# Patient Record
Sex: Male | Born: 1945
Health system: Southern US, Community
[De-identification: ages and names within clinical notes are randomized; demographics above are authoritative.]

## PROBLEM LIST (undated history)

## (undated) DIAGNOSIS — I10 Essential (primary) hypertension: Secondary | ICD-10-CM

## (undated) DIAGNOSIS — E119 Type 2 diabetes mellitus without complications: Secondary | ICD-10-CM

## (undated) DIAGNOSIS — E785 Hyperlipidemia, unspecified: Secondary | ICD-10-CM

## (undated) DIAGNOSIS — G4730 Sleep apnea, unspecified: Secondary | ICD-10-CM

## (undated) HISTORY — PX: TOE SURGERY: SHX1073

## (undated) HISTORY — DX: Essential (primary) hypertension: I10

## (undated) HISTORY — DX: Type 2 diabetes mellitus without complications: E11.9

## (undated) HISTORY — DX: Hyperlipidemia, unspecified: E78.5

---

## 1983-01-13 HISTORY — PX: OTHER SURGICAL HISTORY: SHX169

## 2003-11-28 ENCOUNTER — Ambulatory Visit: Payer: Self-pay

## 2005-10-13 ENCOUNTER — Emergency Department: Payer: Self-pay | Admitting: Emergency Medicine

## 2005-10-14 ENCOUNTER — Other Ambulatory Visit: Payer: Self-pay

## 2005-10-14 ENCOUNTER — Ambulatory Visit: Payer: Self-pay | Admitting: Emergency Medicine

## 2005-11-09 ENCOUNTER — Ambulatory Visit: Payer: Self-pay | Admitting: Gastroenterology

## 2005-12-22 ENCOUNTER — Ambulatory Visit: Payer: Self-pay | Admitting: Gastroenterology

## 2009-05-07 HISTORY — PX: COLONOSCOPY: SHX174

## 2009-12-08 ENCOUNTER — Emergency Department: Payer: Self-pay | Admitting: Unknown Physician Specialty

## 2009-12-10 ENCOUNTER — Ambulatory Visit: Payer: Self-pay | Admitting: Family Medicine

## 2009-12-13 ENCOUNTER — Ambulatory Visit: Payer: Self-pay | Admitting: Family Medicine

## 2011-02-24 ENCOUNTER — Ambulatory Visit: Payer: Self-pay | Admitting: Gastroenterology

## 2011-06-30 ENCOUNTER — Ambulatory Visit: Payer: Self-pay | Admitting: Gastroenterology

## 2011-09-07 ENCOUNTER — Ambulatory Visit: Payer: Self-pay | Admitting: Pain Medicine

## 2011-09-10 ENCOUNTER — Ambulatory Visit: Payer: Self-pay | Admitting: Pain Medicine

## 2011-09-28 ENCOUNTER — Ambulatory Visit: Payer: Self-pay | Admitting: Pain Medicine

## 2011-10-19 ENCOUNTER — Ambulatory Visit: Payer: Self-pay | Admitting: Pain Medicine

## 2011-11-09 ENCOUNTER — Ambulatory Visit: Payer: Self-pay | Admitting: Pain Medicine

## 2012-07-12 ENCOUNTER — Ambulatory Visit: Payer: Self-pay | Admitting: Orthopedic Surgery

## 2013-08-02 ENCOUNTER — Ambulatory Visit: Payer: Self-pay | Admitting: Anesthesiology

## 2013-08-04 ENCOUNTER — Ambulatory Visit: Payer: Self-pay | Admitting: Unknown Physician Specialty

## 2014-06-27 ENCOUNTER — Other Ambulatory Visit: Payer: Self-pay | Admitting: Family Medicine

## 2014-07-03 ENCOUNTER — Encounter: Payer: Self-pay | Admitting: Family Medicine

## 2014-07-03 ENCOUNTER — Ambulatory Visit (INDEPENDENT_AMBULATORY_CARE_PROVIDER_SITE_OTHER): Payer: BLUE CROSS/BLUE SHIELD | Admitting: Family Medicine

## 2014-07-03 VITALS — BP 126/78 | HR 90 | Temp 97.7°F | Resp 18 | Ht 64.0 in | Wt 163.1 lb

## 2014-07-03 DIAGNOSIS — E119 Type 2 diabetes mellitus without complications: Secondary | ICD-10-CM

## 2014-07-03 DIAGNOSIS — E785 Hyperlipidemia, unspecified: Secondary | ICD-10-CM | POA: Diagnosis not present

## 2014-07-03 DIAGNOSIS — I10 Essential (primary) hypertension: Secondary | ICD-10-CM | POA: Diagnosis not present

## 2014-07-03 DIAGNOSIS — N528 Other male erectile dysfunction: Secondary | ICD-10-CM

## 2014-07-03 LAB — POCT UA - MICROALBUMIN: Microalbumin Ur, POC: 20 mg/L

## 2014-07-03 LAB — GLUCOSE, POCT (MANUAL RESULT ENTRY): POC Glucose: 164 mg/dl — AB (ref 70–99)

## 2014-07-03 LAB — POCT GLYCOSYLATED HEMOGLOBIN (HGB A1C): HEMOGLOBIN A1C: 7.8

## 2014-07-03 NOTE — Progress Notes (Signed)
Name: Aaron Calhoun.   MRN: [id number]    DOB: 08/10/1945   Date:07/03/2014       Progress Note  Subjective  Chief Complaint  Chief Complaint  Patient presents with  . Hypertension  . Diabetes  . Hyperlipidemia    Hyperlipidemia This is a chronic problem. The current episode started more than 1 year ago. The problem is controlled. Recent lipid tests were reviewed and are normal. Exacerbating diseases include diabetes and obesity. Factors aggravating his hyperlipidemia include fatty foods. Pertinent negatives include no chest pain, focal weakness, myalgias or shortness of breath. The current treatment provides moderate improvement of lipids. Risk factors for coronary artery disease include diabetes mellitus, dyslipidemia, hypertension and male sex.  Diabetes He presents for his follow-up diabetic visit. He has type 2 diabetes mellitus. His disease course has been worsening. There are no hypoglycemic associated symptoms. Pertinent negatives for hypoglycemia include no dizziness, headaches, nervousness/anxiousness, seizures or tremors. Pertinent negatives for diabetes include no blurred vision, no chest pain, no weakness and no weight loss. Symptoms are worsening. There are no diabetic complications. He is compliant with treatment all of the time. His weight is increasing steadily. He is following a diabetic diet. His home blood glucose trend is increasing steadily. His breakfast blood glucose range is generally 70-90 mg/dl. His highest blood glucose is 140-180 mg/dl.  Hypertension This is a chronic problem. The current episode started more than 1 year ago. The problem is unchanged. The problem is controlled. Pertinent negatives include no blurred vision, chest pain, headaches, neck pain, orthopnea, palpitations or shortness of breath. There are no associated agents to hypertension. Risk factors for coronary artery disease include diabetes mellitus, dyslipidemia, obesity and male gender. Past  treatments include angiotensin blockers and diuretics. The current treatment provides moderate improvement.      Past Medical History  Diagnosis Date  . Diabetes mellitus without complication   . Hyperlipidemia   . Hypertension     History  Substance Use Topics  . Smoking status: Never Smoker   . Smokeless tobacco: Never Used  . Alcohol Use: No     Current outpatient prescriptions:  .  aspirin EC 81 MG tablet, Take 81 mg by mouth daily., Disp: , Rfl:  .  gabapentin (NEURONTIN) 300 MG capsule, Take 300 mg by mouth 4 (four) times daily., Disp: , Rfl:  .  glimepiride (AMARYL) 2 MG tablet, Take 2 mg by mouth daily with breakfast., Disp: , Rfl:  .  JANUVIA 100 MG tablet, TAKE ONE TABLET BY MOUTH ONCE DAILY, Disp: 30 tablet, Rfl: 0 .  metFORMIN (GLUCOPHAGE) 1000 MG tablet, Take 1,000 mg by mouth 2 (two) times daily with a meal., Disp: , Rfl:  .  rosuvastatin (CRESTOR) 5 MG tablet, Take 5 mg by mouth daily., Disp: , Rfl:  .  tadalafil (CIALIS) 20 MG tablet, Take 20 mg by mouth daily as needed for erectile dysfunction., Disp: , Rfl:  .  valsartan-hydrochlorothiazide (DIOVAN-HCT) 160-25 MG per tablet, Take 1 tablet by mouth daily., Disp: , Rfl:   No Known Allergies  Review of Systems  Constitutional: Negative for fever, chills and weight loss.  HENT: Negative for congestion, hearing loss, sore throat and tinnitus.   Eyes: Negative for blurred vision, double vision and redness.  Respiratory: Negative for cough, hemoptysis and shortness of breath.   Cardiovascular: Negative for chest pain, palpitations, orthopnea, claudication and leg swelling.  Gastrointestinal: Negative for heartburn, nausea, vomiting, diarrhea, constipation and blood in stool.  Genitourinary: Negative for  dysuria, urgency, frequency and hematuria.  Musculoskeletal: Negative for myalgias, back pain, joint pain, falls and neck pain.  Skin: Negative for itching.  Neurological: Negative for dizziness, tingling, tremors,  focal weakness, seizures, loss of consciousness, weakness and headaches.  Endo/Heme/Allergies: Does not bruise/bleed easily.  Psychiatric/Behavioral: Negative for depression and substance abuse. The patient is not nervous/anxious and does not have insomnia.      Objective  Filed Vitals:   07/03/14 1511  BP: 126/78  Pulse: 90  Temp: 97.7 F (36.5 C)  TempSrc: Oral  Resp: 18  Height: 5\' 4"  (1.626 m)  Weight: 163 lb 1.6 oz (73.982 kg)  SpO2: 97%     Physical Exam  Constitutional: He is oriented to person, place, and time and well-developed, well-nourished, and in no distress.  HENT:  Head: Normocephalic.  Eyes: EOM are normal. Pupils are equal, round, and reactive to light.  Neck: Normal range of motion. Neck supple. No thyromegaly present.  Cardiovascular: Normal rate, regular rhythm and normal heart sounds.   No murmur heard. Pulmonary/Chest: Effort normal and breath sounds normal. No respiratory distress. He has no wheezes.  Abdominal: Soft. Bowel sounds are normal.  Musculoskeletal: Normal range of motion. He exhibits no edema.  Lymphadenopathy:    He has no cervical adenopathy.  Neurological: He is alert and oriented to person, place, and time. No cranial nerve deficit. Gait normal. Coordination normal.  Skin: Skin is warm and dry. No rash noted.  Psychiatric: Affect and judgment normal.      Assessment & Plan  1. Type 2 diabetes mellitus without complication J4G has risen - gabapentin (NEURONTIN) 300 MG capsule; Take 300 mg by mouth 4 (four) times daily. - valsartan-hydrochlorothiazide (DIOVAN-HCT) 160-25 MG per tablet; Take 1 tablet by mouth daily. - glimepiride (AMARYL) 2 MG tablet; Take 2 mg by mouth daily with breakfast. - metFORMIN (GLUCOPHAGE) 1000 MG tablet; Take 1,000 mg by mouth once.  - aspirin EC 81 MG tablet; Take 81 mg by mouth daily. - tadalafil (CIALIS) 20 MG tablet; Take 20 mg by mouth daily as needed for erectile dysfunction. - POCT Glucose  (CBG) - POCT HgB A1C - POCT UA - Microalbumin  2. Essential hypertension Well-controlled - valsartan-hydrochlorothiazide (DIOVAN-HCT) 160-25 MG per tablet; Take 1 tablet by mouth daily.  3. Hyperlipemia Well-controlled - rosuvastatin (CRESTOR) 5 MG tablet; Take 5 mg by mouth daily.  4. Other male erectile dysfunction Stable - tadalafil (CIALIS) 20 MG tablet; Take 20 mg by mouth daily as needed for erectile dysfunction.

## 2014-07-03 NOTE — Patient Instructions (Signed)
F/U 3 MO

## 2014-07-04 ENCOUNTER — Encounter: Payer: Self-pay | Admitting: Family Medicine

## 2014-07-26 ENCOUNTER — Ambulatory Visit (INDEPENDENT_AMBULATORY_CARE_PROVIDER_SITE_OTHER): Payer: BLUE CROSS/BLUE SHIELD | Admitting: Family Medicine

## 2014-07-26 ENCOUNTER — Encounter: Payer: Self-pay | Admitting: Family Medicine

## 2014-07-26 VITALS — BP 120/68 | HR 75 | Temp 98.2°F | Resp 18 | Ht 64.0 in | Wt 166.0 lb

## 2014-07-26 DIAGNOSIS — Z0000 Encounter for general adult medical examination without abnormal findings: Secondary | ICD-10-CM

## 2014-07-26 DIAGNOSIS — N138 Other obstructive and reflux uropathy: Secondary | ICD-10-CM

## 2014-07-26 DIAGNOSIS — N401 Enlarged prostate with lower urinary tract symptoms: Secondary | ICD-10-CM

## 2014-07-26 MED ORDER — TAMSULOSIN HCL 0.4 MG PO CAPS
0.4000 mg | ORAL_CAPSULE | Freq: Every day | ORAL | Status: DC
Start: 2014-07-26 — End: 2014-11-05

## 2014-07-26 MED ORDER — TAMSULOSIN HCL 0.4 MG PO CAPS: 0.4000 mg | ORAL_CAPSULE | Freq: Every day | ORAL | Status: DC

## 2014-07-26 NOTE — Progress Notes (Signed)
Name: Aaron Calhoun.   MRN: [id number]    DOB: 1945-05-24   Date:07/26/2014       Progress Note  Subjective  Chief Complaint  Chief Complaint  Patient presents with  . Annual Exam    HPI  69 year old for H&P.   There are no new significant medical development since last physical but does continue to struggle with BPH symptomatology of nocturia and frequency.  Past Medical History  Diagnosis Date  . Diabetes mellitus without complication   . Hyperlipidemia   . Hypertension     History  Substance Use Topics  . Smoking status: Never Smoker   . Smokeless tobacco: Never Used  . Alcohol Use: No     Current outpatient prescriptions:  .  aspirin EC 81 MG tablet, Take 81 mg by mouth daily., Disp: , Rfl:  .  gabapentin (NEURONTIN) 300 MG capsule, Take 300 mg by mouth 4 (four) times daily., Disp: , Rfl:  .  glimepiride (AMARYL) 2 MG tablet, Take 2 mg by mouth daily with breakfast., Disp: , Rfl:  .  JANUVIA 100 MG tablet, TAKE ONE TABLET BY MOUTH ONCE DAILY, Disp: 30 tablet, Rfl: 0 .  metFORMIN (GLUCOPHAGE) 1000 MG tablet, Take 1,000 mg by mouth once. , Disp: , Rfl:  .  rosuvastatin (CRESTOR) 5 MG tablet, Take 5 mg by mouth daily., Disp: , Rfl:  .  tadalafil (CIALIS) 20 MG tablet, Take 20 mg by mouth daily as needed for erectile dysfunction., Disp: , Rfl:  .  valsartan-hydrochlorothiazide (DIOVAN-HCT) 160-25 MG per tablet, Take 1 tablet by mouth daily., Disp: , Rfl:   No Known Allergies  Review of Systems  Constitutional: Negative for fever, chills and weight loss.  HENT: Negative for congestion, hearing loss, sore throat and tinnitus.   Eyes: Negative for blurred vision, double vision and redness.  Respiratory: Negative for cough, hemoptysis and shortness of breath.   Cardiovascular: Negative for chest pain, palpitations, orthopnea, claudication and leg swelling.  Gastrointestinal: Negative for heartburn, nausea, vomiting, diarrhea, constipation and blood in stool.   Genitourinary: Positive for urgency and frequency. Negative for dysuria and hematuria.       Has nocturia multiple times at night. There's been no significant improvement with Flomax  Musculoskeletal: Negative for myalgias, back pain, joint pain, falls and neck pain.  Skin: Negative for itching.  Neurological: Negative for dizziness, tingling, tremors, focal weakness, seizures, loss of consciousness, weakness and headaches.  Endo/Heme/Allergies: Does not bruise/bleed easily.  Psychiatric/Behavioral: Negative for depression and substance abuse. The patient is not nervous/anxious and does not have insomnia.      Objective  Filed Vitals:   07/26/14 1535  BP: 120/68  Pulse: 75  Temp: 98.2 F (36.8 C)  TempSrc: Oral  Resp: 18  Height: 5\' 4"  (1.626 m)  Weight: 166 lb (75.297 kg)  SpO2: 97%     Physical Exam  Constitutional: He is oriented to person, place, and time and well-developed, well-nourished, and in no distress.  HENT:  Head: Normocephalic.  Eyes: EOM are normal. Pupils are equal, round, and reactive to light.  Neck: Normal range of motion. Neck supple. No thyromegaly present.  Cardiovascular: Normal rate, regular rhythm and normal heart sounds.   No murmur heard. Pulmonary/Chest: Effort normal and breath sounds normal. No respiratory distress. He has no wheezes.  Abdominal: Soft. Bowel sounds are normal.  Genitourinary: Rectum normal and penis normal. Guaiac negative stool. No discharge found.  Prostate is modestly enlarged  Musculoskeletal: Normal range of motion.  He exhibits no edema.  Lymphadenopathy:    He has no cervical adenopathy.  Neurological: He is alert and oriented to person, place, and time. No cranial nerve deficit. Gait normal. Coordination normal.  Skin: Skin is warm and dry. No rash noted.  Psychiatric: Affect and judgment normal.      Assessment & Plan  1. Annual physical exam  - CBC with Differential/Platelet - POC Hemoccult Bld/Stl (1-Cd  Office Dx) - PSA - Comprehensive metabolic panel - Lipid panel - POC Hemoccult Bld/Stl (3-Cd Home Screen); Future - TSH  2. BPH with obstruction/lower urinary tract symptoms Follow Flomax and he was referred to urologist as well. Routine PSA is also obtained

## 2014-08-07 ENCOUNTER — Other Ambulatory Visit: Payer: Self-pay | Admitting: Family Medicine

## 2014-08-20 ENCOUNTER — Encounter: Payer: Self-pay | Admitting: Family Medicine

## 2014-09-06 ENCOUNTER — Other Ambulatory Visit: Payer: Self-pay | Admitting: Family Medicine

## 2014-09-07 ENCOUNTER — Other Ambulatory Visit: Payer: Self-pay | Admitting: Family Medicine

## 2014-09-19 ENCOUNTER — Other Ambulatory Visit: Payer: Self-pay | Admitting: Emergency Medicine

## 2014-09-20 ENCOUNTER — Encounter: Payer: Self-pay | Admitting: Family Medicine

## 2014-10-01 ENCOUNTER — Other Ambulatory Visit: Payer: Self-pay | Admitting: Family Medicine

## 2014-11-03 ENCOUNTER — Other Ambulatory Visit: Payer: Self-pay | Admitting: Family Medicine

## 2014-11-05 ENCOUNTER — Encounter: Payer: Self-pay | Admitting: Family Medicine

## 2014-11-05 ENCOUNTER — Ambulatory Visit (INDEPENDENT_AMBULATORY_CARE_PROVIDER_SITE_OTHER): Payer: PPO | Admitting: Family Medicine

## 2014-11-05 VITALS — BP 130/70 | HR 76 | Temp 97.0°F | Resp 18 | Ht 64.0 in | Wt 170.0 lb

## 2014-11-05 DIAGNOSIS — E1169 Type 2 diabetes mellitus with other specified complication: Secondary | ICD-10-CM | POA: Insufficient documentation

## 2014-11-05 DIAGNOSIS — E0840 Diabetes mellitus due to underlying condition with diabetic neuropathy, unspecified: Secondary | ICD-10-CM

## 2014-11-05 DIAGNOSIS — E1140 Type 2 diabetes mellitus with diabetic neuropathy, unspecified: Secondary | ICD-10-CM | POA: Insufficient documentation

## 2014-11-05 DIAGNOSIS — IMO0002 Reserved for concepts with insufficient information to code with codable children: Secondary | ICD-10-CM | POA: Insufficient documentation

## 2014-11-05 DIAGNOSIS — N400 Enlarged prostate without lower urinary tract symptoms: Secondary | ICD-10-CM | POA: Diagnosis not present

## 2014-11-05 DIAGNOSIS — E785 Hyperlipidemia, unspecified: Secondary | ICD-10-CM

## 2014-11-05 DIAGNOSIS — I10 Essential (primary) hypertension: Secondary | ICD-10-CM | POA: Diagnosis not present

## 2014-11-05 DIAGNOSIS — E119 Type 2 diabetes mellitus without complications: Secondary | ICD-10-CM | POA: Insufficient documentation

## 2014-11-05 LAB — GLUCOSE, POCT (MANUAL RESULT ENTRY): POC Glucose: 153 mg/dl — AB (ref 70–99)

## 2014-11-05 LAB — POCT GLYCOSYLATED HEMOGLOBIN (HGB A1C): HEMOGLOBIN A1C: 7.1

## 2014-11-05 MED ORDER — CRESTOR 5 MG PO TABS
5.0000 mg | ORAL_TABLET | Freq: Every day | ORAL | Status: DC
Start: 2014-11-05 — End: 2016-01-21

## 2014-11-05 MED ORDER — METFORMIN HCL 1000 MG PO TABS: 1000.0000 mg | ORAL_TABLET | Freq: Once | ORAL | Status: DC

## 2014-11-05 MED ORDER — SITAGLIPTIN PHOSPHATE 100 MG PO TABS: 100.0000 mg | ORAL_TABLET | Freq: Every day | ORAL | Status: DC

## 2014-11-05 MED ORDER — GLIMEPIRIDE 2 MG PO TABS: 2.0000 mg | ORAL_TABLET | Freq: Every day | ORAL | Status: DC

## 2014-11-05 MED ORDER — VALSARTAN-HYDROCHLOROTHIAZIDE 160-25 MG PO TABS: 1.0000 | ORAL_TABLET | Freq: Every day | ORAL | Status: DC

## 2014-11-05 MED ORDER — CELECOXIB 200 MG PO CAPS: 200.0000 mg | ORAL_CAPSULE | Freq: Two times a day (BID) | ORAL | Status: DC

## 2014-11-05 MED ORDER — TAMSULOSIN HCL 0.4 MG PO CAPS: 0.4000 mg | ORAL_CAPSULE | Freq: Every day | ORAL | Status: DC

## 2014-11-05 NOTE — Progress Notes (Signed)
Name: Aaron Calhoun.   MRN: [id number]    DOB: 1945/05/27   Date:11/05/2014       Progress Note  Subjective  Chief Complaint  Chief Complaint  Patient presents with  . Hypertension    4 month follow up  . Diabetes  . Hyperlipidemia    HPI  Hypertension   Patient presents for follow-up of hypertension. It has been present for over 5 years.  Patient states that there is compliance with medical regimen which consists of  . There is no end organ disease. Cardiac risk factors include hypertension hyperlipidemia and diabetes.  Exercise regimen consist of some walking some walking .  Diet consist of low-sodium . Diovan HCT 160/25  Diabetes  Patient presents for follow-up of diabetes which is present for over 5 years. Is currently on a regimen of Januvia 100 mg daily metformin thousand milligrams daily and glimepiride 2 mg daily   . Patient states irregular compliance with their diet and exercise. There's been no hypoglycemic episodes and there is no polyuria polydipsia polyphagia. His average fasting glucoses been in the low around 89 with a high around 231 . There is no end organ disease.  Last diabetic eye exam was less than one year ago assuming year ago.   Last visit with dietitian was less than one year ago. Last microalbumin was obtained June 2016 20 .   Hyperlipidemia  Patient has a history of hyperlipidemia for over 5 years.  Current medical regimen consist of Crestor 5 mg every at bedtime .  Compliance is good .  Diet and exercise are currently followed usually .  Risk factors for cardiovascular disease include hyperlipidemia diabetes hypertension sedentary lifestyle age over 32 .   There have been no side effects from the medication.      Past Medical History  Diagnosis Date  . Diabetes mellitus without complication ([place])   . Hyperlipidemia   . Hypertension     Social History  Substance Use Topics  . Smoking status: Never Smoker   . Smokeless tobacco: Never Used  .  Alcohol Use: No     Current outpatient prescriptions:  .  aspirin EC 81 MG tablet, Take 81 mg by mouth daily., Disp: , Rfl:  .  celecoxib (CELEBREX) 200 MG capsule, Take 1 capsule (200 mg total) by mouth 2 (two) times daily., Disp: 60 capsule, Rfl: 2 .  CIALIS 20 MG tablet, TAKE ONE TABLET BY MOUTH ONCE DAILY AS NEEDED, Disp: 6 tablet, Rfl: 0 .  CRESTOR 5 MG tablet, Take 1 tablet (5 mg total) by mouth daily., Disp: 30 tablet, Rfl: 5 .  gabapentin (NEURONTIN) 300 MG capsule, Take 300 mg by mouth 4 (four) times daily., Disp: , Rfl:  .  glimepiride (AMARYL) 2 MG tablet, Take 1 tablet (2 mg total) by mouth daily with breakfast., Disp: 60 tablet, Rfl: 5 .  metFORMIN (GLUCOPHAGE) 1000 MG tablet, Take 1 tablet (1,000 mg total) by mouth once., Disp: 30 tablet, Rfl: 5 .  sitaGLIPtin (JANUVIA) 100 MG tablet, Take 1 tablet (100 mg total) by mouth daily., Disp: 30 tablet, Rfl: 5 .  valsartan-hydrochlorothiazide (DIOVAN-HCT) 160-25 MG tablet, Take 1 tablet by mouth daily., Disp: 30 tablet, Rfl: 5 .  tamsulosin (FLOMAX) 0.4 MG CAPS capsule, Take 1 capsule (0.4 mg total) by mouth daily., Disp: 30 capsule, Rfl: 3  No Known Allergies  Review of Systems  Constitutional: Negative for fever, chills and weight loss.  HENT: Negative for congestion, hearing loss, sore throat  and tinnitus.   Eyes: Negative for blurred vision, double vision and redness.  Respiratory: Negative for cough, hemoptysis and shortness of breath.   Cardiovascular: Negative for chest pain, palpitations, orthopnea, claudication and leg swelling.  Gastrointestinal: Negative for heartburn, nausea, vomiting, diarrhea, constipation and blood in stool.  Genitourinary: Positive for urgency (DESPITE USES fLOMAX) and frequency. Negative for dysuria and hematuria.  Musculoskeletal: Negative for myalgias, back pain, joint pain, falls and neck pain.  Skin: Negative for itching.  Neurological: Positive for sensory change. Negative for dizziness,  tingling, tremors, focal weakness, seizures, loss of consciousness, weakness and headaches.  Endo/Heme/Allergies: Does not bruise/bleed easily.  Psychiatric/Behavioral: Negative for depression and substance abuse. The patient is not nervous/anxious and does not have insomnia.      Objective  Filed Vitals:   11/05/14 0952  BP: 130/70  Pulse: 76  Temp: 97 F (36.1 C)  TempSrc: Oral  Resp: 18  Height: 5\' 4"  (1.626 m)  Weight: 170 lb (77.111 kg)  SpO2: 97%     Physical Exam  Constitutional: He is oriented to person, place, and time and well-developed, well-nourished, and in no distress.  HENT:  Head: Normocephalic.  Eyes: EOM are normal. Pupils are equal, round, and reactive to light.  Neck: Normal range of motion. Neck supple. No thyromegaly present.  Cardiovascular: Normal rate, regular rhythm and normal heart sounds.   No murmur heard. Pulmonary/Chest: Effort normal and breath sounds normal. No respiratory distress. He has no wheezes.  Abdominal: Soft. Bowel sounds are normal.  Musculoskeletal: Normal range of motion. He exhibits no edema.  Lymphadenopathy:    He has no cervical adenopathy.  Neurological: He is alert and oriented to person, place, and time. No cranial nerve deficit. Gait normal. Coordination normal.  Skin: Skin is warm and dry. No rash noted.  Psychiatric: Affect and judgment normal.      Assessment & Plan   1. Type 2 diabetes mellitus with other specified complication (HCC)  - POCT HgB A1C - POCT Glucose (CBG) - metFORMIN (GLUCOPHAGE) 1000 MG tablet; Take 1 tablet (1,000 mg total) by mouth once.  Dispense: 30 tablet; Refill: 5 - glimepiride (AMARYL) 2 MG tablet; Take 1 tablet (2 mg total) by mouth daily with breakfast.  Dispense: 60 tablet; Refill: 5 - TSH  2. Diabetes mellitus due to underlying condition with diabetic neuropathy, without long-term current use of insulin (HCC) Continue gabapentIN  3. Essential hypertension Well-controlled -  Comprehensive Metabolic Panel (CMET)  4. Hyperlipidemia Lab - Lipid panel  5. Benign prostate hyperplasia This patient has failed a trial of Flomax will refer him on to urologist - Ambulatory referral to Urology

## 2014-11-06 LAB — COMPREHENSIVE METABOLIC PANEL
ALK PHOS: 69 IU/L (ref 39–117)
ALT: 38 IU/L (ref 0–44)
AST: 21 IU/L (ref 0–40)
Albumin/Globulin Ratio: 1.9 (ref 1.1–2.5)
Albumin: 4.9 g/dL — ABNORMAL HIGH (ref 3.6–4.8)
BILIRUBIN TOTAL: 0.6 mg/dL (ref 0.0–1.2)
BUN/Creatinine Ratio: 16 (ref 10–22)
BUN: 13 mg/dL (ref 8–27)
CHLORIDE: 99 mmol/L (ref 97–106)
CO2: 27 mmol/L (ref 18–29)
CREATININE: 0.81 mg/dL (ref 0.76–1.27)
Calcium: 9.7 mg/dL (ref 8.6–10.2)
GFR calc Af Amer: 105 mL/min/{1.73_m2} (ref >59)
GFR calc non Af Amer: 91 mL/min/{1.73_m2} (ref >59)
GLUCOSE: 158 mg/dL — AB (ref 65–99)
Globulin, Total: 2.6 g/dL (ref 1.5–4.5)
Potassium: 4.5 mmol/L (ref 3.5–5.2)
Sodium: 142 mmol/L (ref 136–144)
Total Protein: 7.5 g/dL (ref 6.0–8.5)

## 2014-11-06 LAB — LIPID PANEL
CHOLESTEROL TOTAL: 137 mg/dL (ref 100–199)
Chol/HDL Ratio: 2 ratio units (ref 0.0–5.0)
HDL: 68 mg/dL (ref >39)
LDL CALC: 49 mg/dL (ref 0–99)
TRIGLYCERIDES: 99 mg/dL (ref 0–149)
VLDL CHOLESTEROL CAL: 20 mg/dL (ref 5–40)

## 2014-11-06 LAB — TSH: TSH: 2.19 u[IU]/mL (ref 0.450–4.500)

## 2014-11-07 ENCOUNTER — Ambulatory Visit (INDEPENDENT_AMBULATORY_CARE_PROVIDER_SITE_OTHER): Payer: PPO | Admitting: Obstetrics and Gynecology

## 2014-11-07 ENCOUNTER — Encounter: Payer: Self-pay | Admitting: Obstetrics and Gynecology

## 2014-11-07 VITALS — Resp 16 | Ht 65.0 in | Wt 172.3 lb

## 2014-11-07 DIAGNOSIS — N529 Male erectile dysfunction, unspecified: Secondary | ICD-10-CM

## 2014-11-07 DIAGNOSIS — N400 Enlarged prostate without lower urinary tract symptoms: Secondary | ICD-10-CM | POA: Diagnosis not present

## 2014-11-07 LAB — BLADDER SCAN AMB NON-IMAGING

## 2014-11-07 MED ORDER — FINASTERIDE 5 MG PO TABS: 5.0000 mg | ORAL_TABLET | Freq: Every day | ORAL | Status: DC

## 2014-11-07 MED ORDER — SILDENAFIL CITRATE 20 MG PO TABS: ORAL_TABLET | ORAL | Status: DC
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
## 2014-11-08 LAB — PSA: PROSTATE SPECIFIC AG, SERUM: 1 ng/mL (ref 0.0–4.0)

## 2014-11-09 ENCOUNTER — Telehealth: Payer: Self-pay

## 2014-11-09 NOTE — Telephone Encounter (Signed)
-----   Message from Roda Shutters, [place] sent at 11/09/2014  3:45 PM EDT ----- Please notify patient that his PSA was good at 1.0. Thanks

## 2014-11-09 NOTE — Telephone Encounter (Signed)
Spoke with pt in reference to PSA results. Pt voiced understanding.  

## 2014-11-15 ENCOUNTER — Other Ambulatory Visit: Payer: Self-pay | Admitting: Family Medicine

## 2014-11-30 ENCOUNTER — Telehealth: Payer: Self-pay

## 2014-11-30 ENCOUNTER — Telehealth: Payer: Self-pay | Admitting: Urology

## 2014-11-30 NOTE — Telephone Encounter (Signed)
Pt came by stating his sildenafil requires a PA. Nurse attempted to do PA and insurance company states this medication is on their formulary and does not require a PA. Pt is allowed to have 90a month.

## 2014-11-30 NOTE — Telephone Encounter (Signed)
Patient wants to know about a prior autho request that he dropped off a few days ago?   Can you call him back please,  thanks

## 2014-11-30 NOTE — Telephone Encounter (Signed)
Per [place] the patient can have 90 a month the medication is already on his formulary and it does not require a prior autho. It shouls already be at his pharmacy and he just needs to go pick it up. I spoke with thepatient's wife and explained that to her today at 4:36.    Sharyn Lull

## 2014-12-04 ENCOUNTER — Other Ambulatory Visit: Payer: Self-pay

## 2014-12-04 DIAGNOSIS — N529 Male erectile dysfunction, unspecified: Secondary | ICD-10-CM

## 2014-12-04 MED ORDER — SILDENAFIL CITRATE 20 MG PO TABS: ORAL_TABLET | ORAL | Status: DC

## 2014-12-21 ENCOUNTER — Other Ambulatory Visit: Payer: Self-pay | Admitting: Obstetrics and Gynecology

## 2014-12-26 ENCOUNTER — Other Ambulatory Visit: Payer: Self-pay

## 2014-12-26 DIAGNOSIS — N529 Male erectile dysfunction, unspecified: Secondary | ICD-10-CM

## 2014-12-26 MED ORDER — SILDENAFIL CITRATE 20 MG PO TABS: ORAL_TABLET | ORAL | Status: DC

## 2015-01-24 ENCOUNTER — Other Ambulatory Visit: Payer: Self-pay

## 2015-01-24 DIAGNOSIS — N529 Male erectile dysfunction, unspecified: Secondary | ICD-10-CM

## 2015-01-24 MED ORDER — SILDENAFIL CITRATE 100 MG PO TABS: 100.0000 mg | ORAL_TABLET | Freq: Every day | ORAL | Status: DC | PRN

## 2015-01-25 ENCOUNTER — Telehealth: Payer: Self-pay | Admitting: Obstetrics and Gynecology

## 2015-01-25 ENCOUNTER — Other Ambulatory Visit: Payer: Self-pay

## 2015-01-25 DIAGNOSIS — N529 Male erectile dysfunction, unspecified: Secondary | ICD-10-CM

## 2015-01-25 MED ORDER — SILDENAFIL CITRATE 20 MG PO TABS: ORAL_TABLET | ORAL | Status: DC

## 2015-01-25 NOTE — Telephone Encounter (Signed)
Pt came to office and would like his rx for Sildenafil sent to [facility] on [address].  He has new insurance.  I left copy on your desk.

## 2015-01-25 NOTE — Telephone Encounter (Signed)
Resent medication to pharmacy

## 2015-01-28 NOTE — Telephone Encounter (Signed)
Encounter made in error. 

## 2015-01-30 ENCOUNTER — Other Ambulatory Visit: Payer: Self-pay | Admitting: Family Medicine

## 2015-01-30 DIAGNOSIS — E1169 Type 2 diabetes mellitus with other specified complication: Secondary | ICD-10-CM

## 2015-01-30 MED ORDER — TAMSULOSIN HCL 0.4 MG PO CAPS: 0.4000 mg | ORAL_CAPSULE | Freq: Every day | ORAL | Status: DC

## 2015-01-30 MED ORDER — METFORMIN HCL 1000 MG PO TABS: 1000.0000 mg | ORAL_TABLET | Freq: Once | ORAL | Status: DC

## 2015-01-30 MED ORDER — GABAPENTIN 300 MG PO CAPS
300.0000 mg | ORAL_CAPSULE | Freq: Four times a day (QID) | ORAL | Status: DC
Start: 2015-01-30 — End: 2015-02-18

## 2015-01-30 MED ORDER — GLIMEPIRIDE 2 MG PO TABS: 2.0000 mg | ORAL_TABLET | Freq: Every day | ORAL | Status: DC

## 2015-01-30 MED ORDER — FINASTERIDE 5 MG PO TABS: 5.0000 mg | ORAL_TABLET | Freq: Every day | ORAL | Status: DC

## 2015-01-30 MED ORDER — VALSARTAN-HYDROCHLOROTHIAZIDE 160-25 MG PO TABS: 1.0000 | ORAL_TABLET | Freq: Every day | ORAL | Status: DC

## 2015-01-30 NOTE — Telephone Encounter (Signed)
Patient came into office and stated that his Insurance has changed and need refill on medication at [facility]. Would run out of medication before appointment in February. 3 month supply sent to [facility].

## 2015-02-06 ENCOUNTER — Other Ambulatory Visit: Payer: Self-pay | Admitting: Obstetrics and Gynecology

## 2015-02-06 ENCOUNTER — Encounter: Payer: Self-pay | Admitting: Family Medicine

## 2015-02-06 ENCOUNTER — Ambulatory Visit (INDEPENDENT_AMBULATORY_CARE_PROVIDER_SITE_OTHER): Payer: Medicare Other | Admitting: Family Medicine

## 2015-02-06 VITALS — BP 142/84 | HR 93 | Temp 98.3°F | Resp 16 | Ht 65.0 in | Wt 168.0 lb

## 2015-02-06 DIAGNOSIS — N400 Enlarged prostate without lower urinary tract symptoms: Secondary | ICD-10-CM

## 2015-02-06 DIAGNOSIS — I10 Essential (primary) hypertension: Secondary | ICD-10-CM | POA: Diagnosis not present

## 2015-02-06 DIAGNOSIS — M5416 Radiculopathy, lumbar region: Secondary | ICD-10-CM | POA: Diagnosis not present

## 2015-02-06 MED ORDER — TRAMADOL HCL 50 MG PO TABS: 50.0000 mg | ORAL_TABLET | Freq: Four times a day (QID) | ORAL | Status: DC | PRN

## 2015-02-06 MED ORDER — CYCLOBENZAPRINE HCL 5 MG PO TABS: 5.0000 mg | ORAL_TABLET | Freq: Three times a day (TID) | ORAL | Status: DC | PRN
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
## 2015-02-06 NOTE — Patient Instructions (Signed)
1) Buy heating pad OTC at your pharmacy to plug in and use on her back to loosen up the tight muscles  2) Stretching exercises for your back at least morning and evening while on heating pad for 10 mins  Back Exercises The following exercises strengthen the muscles that help to support the back. They also help to keep the lower back flexible. Doing these exercises can help to prevent back pain or lessen existing pain. If you have back pain or discomfort, try doing these exercises 2-3 times each day or as told by your health care provider. When the pain goes away, do them once each day, but increase the number of times that you repeat the steps for each exercise (do more repetitions). If you do not have back pain or discomfort, do these exercises once each day or as told by your health care provider. EXERCISES Single Knee to Chest Repeat these steps 3-5 times for each leg: 1. Lie on your back on a firm bed or the floor with your legs extended. 2. Bring one knee to your chest. Your other leg should stay extended and in contact with the floor. 3. Hold your knee in place by grabbing your knee or thigh. 4. Pull on your knee until you feel a gentle stretch in your lower back. 5. Hold the stretch for 10-30 seconds. 6. Slowly release and straighten your leg. Pelvic Tilt Repeat these steps 5-10 times: 1. Lie on your back on a firm bed or the floor with your legs extended. 2. Bend your knees so they are pointing toward the ceiling and your feet are flat on the floor. 3. Tighten your lower abdominal muscles to press your lower back against the floor. This motion will tilt your pelvis so your tailbone points up toward the ceiling instead of pointing to your feet or the floor. 4. With gentle tension and even breathing, hold this position for 5-10 seconds. Cat-Cow Repeat these steps until your lower back becomes more flexible: 1. Get into a hands-and-knees position on a firm surface. Keep your hands under  your shoulders, and keep your knees under your hips. You may place padding under your knees for comfort. 2. Let your head hang down, and point your tailbone toward the floor so your lower back becomes rounded like the back of a cat. 3. Hold this position for 5 seconds. 4. Slowly lift your head and point your tailbone up toward the ceiling so your back forms a sagging arch like the back of a cow. 5. Hold this position for 5 seconds. Press-Ups Repeat these steps 5-10 times: 1. Lie on your abdomen (face-down) on the floor. 2. Place your palms near your head, about shoulder-width apart. 3. While you keep your back as relaxed as possible and keep your hips on the floor, slowly straighten your arms to raise the top half of your body and lift your shoulders. Do not use your back muscles to raise your upper torso. You may adjust the placement of your hands to make yourself more comfortable. 4. Hold this position for 5 seconds while you keep your back relaxed. 5. Slowly return to lying flat on the floor. Bridges Repeat these steps 10 times: 1. Lie on your back on a firm surface. 2. Bend your knees so they are pointing toward the ceiling and your feet are flat on the floor. 3. Tighten your buttocks muscles and lift your buttocks off of the floor until your waist is at almost the same  height as your knees. You should feel the muscles working in your buttocks and the back of your thighs. If you do not feel these muscles, slide your feet 1-2 inches farther away from your buttocks. 4. Hold this position for 3-5 seconds. 5. Slowly lower your hips to the starting position, and allow your buttocks muscles to relax completely. If this exercise is too easy, try doing it with your arms crossed over your chest. Abdominal Crunches Repeat these steps 5-10 times: 1. Lie on your back on a firm bed or the floor with your legs extended. 2. Bend your knees so they are pointing toward the ceiling and your feet are flat on  the floor. 3. Cross your arms over your chest. 4. Tip your chin slightly toward your chest without bending your neck. 5. Tighten your abdominal muscles and slowly raise your trunk (torso) high enough to lift your shoulder blades a tiny bit off of the floor. Avoid raising your torso higher than that, because it can put too much stress on your low back and it does not help to strengthen your abdominal muscles. 6. Slowly return to your starting position. Back Lifts Repeat these steps 5-10 times: 1. Lie on your abdomen (face-down) with your arms at your sides, and rest your forehead on the floor. 2. Tighten the muscles in your legs and your buttocks. 3. Slowly lift your chest off of the floor while you keep your hips pressed to the floor. Keep the back of your head in line with the curve in your back. Your eyes should be looking at the floor. 4. Hold this position for 3-5 seconds. 5. Slowly return to your starting position. SEEK MEDICAL CARE IF:  Your back pain or discomfort gets much worse when you do an exercise.  Your back pain or discomfort does not lessen within 2 hours after you exercise. If you have any of these problems, stop doing these exercises right away. Do not do them again unless your health care provider says that you can. SEEK IMMEDIATE MEDICAL CARE IF:  You develop sudden, severe back pain. If this happens, stop doing the exercises right away. Do not do them again unless your health care provider says that you can.   This information is not intended to replace advice given to you by your health care provider. Make sure you discuss any questions you have with your health care provider.   Document Released: 02/06/2004 Document Revised: 09/19/2014 Document Reviewed: 02/22/2014 Elsevier Interactive Patient Education [employer].

## 2015-02-07 ENCOUNTER — Encounter: Payer: Self-pay | Admitting: Obstetrics and Gynecology

## 2015-02-07 ENCOUNTER — Ambulatory Visit (INDEPENDENT_AMBULATORY_CARE_PROVIDER_SITE_OTHER): Payer: Medicare Other | Admitting: Obstetrics and Gynecology

## 2015-02-07 VITALS — BP 156/83 | HR 98 | Resp 16 | Ht 65.0 in | Wt 172.1 lb

## 2015-02-07 DIAGNOSIS — N400 Enlarged prostate without lower urinary tract symptoms: Secondary | ICD-10-CM | POA: Diagnosis not present

## 2015-02-07 DIAGNOSIS — N529 Male erectile dysfunction, unspecified: Secondary | ICD-10-CM

## 2015-02-07 LAB — BLADDER SCAN AMB NON-IMAGING

## 2015-02-07 MED ORDER — SILDENAFIL CITRATE 100 MG PO TABS: ORAL_TABLET | ORAL | Status: DC
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
## 2015-02-08 ENCOUNTER — Telehealth: Payer: Self-pay

## 2015-02-08 ENCOUNTER — Other Ambulatory Visit: Payer: Self-pay

## 2015-02-08 DIAGNOSIS — E1169 Type 2 diabetes mellitus with other specified complication: Secondary | ICD-10-CM

## 2015-02-08 LAB — PSA: Prostate Specific Ag, Serum: 0.6 ng/mL (ref 0.0–4.0)

## 2015-02-08 MED ORDER — METFORMIN HCL 1000 MG PO TABS
1000.0000 mg | ORAL_TABLET | Freq: Every day | ORAL | Status: DC
Start: 2015-02-08 — End: 2015-02-11

## 2015-02-08 NOTE — Telephone Encounter (Signed)
-----   Message from Roda Shutters, [place] sent at 02/08/2015 10:59 AM EST ----- Please notify patient that his PSA was great at 0.6. He needs to keep his follow-up appointment as scheduled. Thanks

## 2015-02-08 NOTE — Telephone Encounter (Signed)
Spoke with pt wife in reference to PSA results. Wife voiced understanding.  

## 2015-02-11 ENCOUNTER — Other Ambulatory Visit: Payer: Self-pay | Admitting: Family Medicine

## 2015-02-11 DIAGNOSIS — E1169 Type 2 diabetes mellitus with other specified complication: Secondary | ICD-10-CM

## 2015-02-11 MED ORDER — METFORMIN HCL 1000 MG PO TABS: 1000.0000 mg | ORAL_TABLET | Freq: Two times a day (BID) | ORAL | Status: DC

## 2015-02-18 ENCOUNTER — Other Ambulatory Visit: Payer: Self-pay | Admitting: Family Medicine

## 2015-03-06 ENCOUNTER — Ambulatory Visit
Admission: RE | Admit: 2015-03-06 | Discharge: 2015-03-06 | Disposition: A | Discharge status: Home / Self Care | Payer: Medicare Other | Source: Ambulatory Visit | Attending: Family Medicine | Admitting: Family Medicine

## 2015-03-06 ENCOUNTER — Encounter: Payer: Self-pay | Admitting: Family Medicine

## 2015-03-06 ENCOUNTER — Ambulatory Visit (INDEPENDENT_AMBULATORY_CARE_PROVIDER_SITE_OTHER): Payer: Medicare Other | Admitting: Family Medicine

## 2015-03-06 VITALS — BP 153/81 | HR 136 | Temp 97.8°F | Resp 22 | Ht 65.0 in | Wt 167.5 lb

## 2015-03-06 DIAGNOSIS — M5136 Other intervertebral disc degeneration, lumbar region: Secondary | ICD-10-CM | POA: Insufficient documentation

## 2015-03-06 DIAGNOSIS — M5416 Radiculopathy, lumbar region: Secondary | ICD-10-CM

## 2015-03-06 MED ORDER — TIZANIDINE HCL 2 MG PO CAPS: 2.0000 mg | ORAL_CAPSULE | Freq: Three times a day (TID) | ORAL | Status: DC | PRN
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
## 2015-03-11 ENCOUNTER — Ambulatory Visit: Payer: PPO | Admitting: Family Medicine

## 2015-03-18 ENCOUNTER — Ambulatory Visit: Payer: Medicare Other | Admitting: Family Medicine

## 2015-03-22 ENCOUNTER — Encounter: Payer: Self-pay | Admitting: Family Medicine

## 2015-03-22 ENCOUNTER — Ambulatory Visit (INDEPENDENT_AMBULATORY_CARE_PROVIDER_SITE_OTHER): Payer: Medicare Other | Admitting: Family Medicine

## 2015-03-22 VITALS — BP 124/72 | HR 104 | Temp 98.2°F | Resp 18 | Ht 65.0 in | Wt 167.3 lb

## 2015-03-22 DIAGNOSIS — M5417 Radiculopathy, lumbosacral region: Secondary | ICD-10-CM

## 2015-03-22 DIAGNOSIS — M5416 Radiculopathy, lumbar region: Secondary | ICD-10-CM
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
## 2015-03-28 ENCOUNTER — Other Ambulatory Visit: Payer: Self-pay

## 2015-03-28 DIAGNOSIS — M5416 Radiculopathy, lumbar region: Secondary | ICD-10-CM

## 2015-03-28 MED ORDER — TRAMADOL HCL 50 MG PO TABS: 50.0000 mg | ORAL_TABLET | Freq: Four times a day (QID) | ORAL | Status: DC | PRN

## 2015-04-12 ENCOUNTER — Ambulatory Visit
Admission: RE | Admit: 2015-04-12 | Discharge: 2015-04-12 | Disposition: A | Discharge status: Home / Self Care | Payer: Medicare Other | Source: Ambulatory Visit | Attending: Family Medicine | Admitting: Family Medicine

## 2015-04-12 DIAGNOSIS — M5126 Other intervertebral disc displacement, lumbar region: Secondary | ICD-10-CM | POA: Insufficient documentation

## 2015-04-12 DIAGNOSIS — M4806 Spinal stenosis, lumbar region: Secondary | ICD-10-CM | POA: Insufficient documentation

## 2015-04-12 DIAGNOSIS — M5417 Radiculopathy, lumbosacral region: Secondary | ICD-10-CM | POA: Diagnosis not present

## 2015-04-12 DIAGNOSIS — M5416 Radiculopathy, lumbar region: Secondary | ICD-10-CM

## 2015-04-15 ENCOUNTER — Other Ambulatory Visit: Payer: Self-pay

## 2015-04-15 DIAGNOSIS — E1169 Type 2 diabetes mellitus with other specified complication: Secondary | ICD-10-CM

## 2015-04-15 MED ORDER — GLIMEPIRIDE 2 MG PO TABS: ORAL_TABLET | ORAL | Status: DC

## 2015-04-15 MED ORDER — METFORMIN HCL 1000 MG PO TABS: 1000.0000 mg | ORAL_TABLET | Freq: Two times a day (BID) | ORAL | Status: DC

## 2015-04-15 MED ORDER — FINASTERIDE 5 MG PO TABS: ORAL_TABLET | ORAL | Status: DC

## 2015-04-15 MED ORDER — GABAPENTIN 300 MG PO CAPS: ORAL_CAPSULE | ORAL | Status: DC

## 2015-04-15 MED ORDER — VALSARTAN-HYDROCHLOROTHIAZIDE 160-25 MG PO TABS: 1.0000 | ORAL_TABLET | Freq: Every day | ORAL | Status: DC

## 2015-04-16 ENCOUNTER — Encounter: Payer: Self-pay | Admitting: Family Medicine

## 2015-04-16 ENCOUNTER — Ambulatory Visit (INDEPENDENT_AMBULATORY_CARE_PROVIDER_SITE_OTHER): Payer: Medicare Other | Admitting: Family Medicine

## 2015-04-16 VITALS — BP 126/73 | HR 86 | Temp 98.6°F | Resp 16 | Ht 65.0 in | Wt 170.2 lb

## 2015-04-16 DIAGNOSIS — M5417 Radiculopathy, lumbosacral region: Secondary | ICD-10-CM | POA: Diagnosis not present

## 2015-04-16 DIAGNOSIS — M5416 Radiculopathy, lumbar region: Secondary | ICD-10-CM

## 2015-04-16 DIAGNOSIS — E0840 Diabetes mellitus due to underlying condition with diabetic neuropathy, unspecified: Secondary | ICD-10-CM | POA: Diagnosis not present

## 2015-04-16 LAB — GLUCOSE, POCT (MANUAL RESULT ENTRY): POC GLUCOSE: 190 mg/dL — AB (ref 70–99)

## 2015-04-16 LAB — POCT GLYCOSYLATED HEMOGLOBIN (HGB A1C): HEMOGLOBIN A1C: 10.5

## 2015-04-16 MED ORDER — HYDROCODONE-ACETAMINOPHEN 5-325 MG PO TABS: 1.0000 | ORAL_TABLET | Freq: Three times a day (TID) | ORAL | Status: DC | PRN
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
## 2015-04-18 ENCOUNTER — Ambulatory Visit: Payer: Medicare Other | Admitting: Family Medicine

## 2015-04-22 ENCOUNTER — Other Ambulatory Visit: Payer: Self-pay | Admitting: Family Medicine

## 2015-04-22 DIAGNOSIS — M48061 Spinal stenosis, lumbar region without neurogenic claudication: Secondary | ICD-10-CM | POA: Insufficient documentation

## 2015-04-23 ENCOUNTER — Encounter: Payer: Self-pay | Admitting: Family Medicine

## 2015-04-23 ENCOUNTER — Ambulatory Visit (INDEPENDENT_AMBULATORY_CARE_PROVIDER_SITE_OTHER): Payer: Medicare Other | Admitting: Family Medicine

## 2015-04-23 VITALS — BP 138/76 | HR 87 | Temp 98.0°F | Resp 16 | Wt 168.7 lb

## 2015-04-23 DIAGNOSIS — IMO0001 Reserved for inherently not codable concepts without codable children: Secondary | ICD-10-CM

## 2015-04-23 DIAGNOSIS — E1165 Type 2 diabetes mellitus with hyperglycemia: Secondary | ICD-10-CM | POA: Diagnosis not present

## 2015-04-23 MED ORDER — EMPAGLIFLOZIN 10 MG PO TABS: 10.0000 mg | ORAL_TABLET | Freq: Every day | ORAL | Status: DC
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
## 2015-04-30 DIAGNOSIS — M5126 Other intervertebral disc displacement, lumbar region: Secondary | ICD-10-CM | POA: Diagnosis not present

## 2015-05-01 ENCOUNTER — Other Ambulatory Visit: Payer: Self-pay | Admitting: Neurosurgery

## 2015-05-01 DIAGNOSIS — M5126 Other intervertebral disc displacement, lumbar region: Secondary | ICD-10-CM

## 2015-05-06 ENCOUNTER — Ambulatory Visit
Admission: RE | Admit: 2015-05-06 | Discharge: 2015-05-06 | Disposition: A | Discharge status: Home / Self Care | Payer: Medicare Other | Source: Ambulatory Visit | Attending: Neurosurgery | Admitting: Neurosurgery

## 2015-05-06 DIAGNOSIS — M5126 Other intervertebral disc displacement, lumbar region: Secondary | ICD-10-CM

## 2015-05-06 DIAGNOSIS — M545 Low back pain: Secondary | ICD-10-CM | POA: Diagnosis not present

## 2015-05-06 MED ORDER — IOHEXOL 180 MG/ML  SOLN
1.0000 mL | Freq: Once | INTRAMUSCULAR | Status: AC | PRN
  Administered 2015-05-06: 1 mL via EPIDURAL

## 2015-05-06 MED ORDER — METHYLPREDNISOLONE ACETATE 40 MG/ML INJ SUSP (RADIOLOG
120.0000 mg | Freq: Once | INTRAMUSCULAR | Status: AC
  Administered 2015-05-06: 120 mg via EPIDURAL

## 2015-05-06 NOTE — Discharge Instructions (Signed)

## 2015-05-08 ENCOUNTER — Ambulatory Visit: Payer: Medicare Other | Admitting: Urology

## 2015-05-08 ENCOUNTER — Ambulatory Visit: Payer: Medicare Other | Admitting: Obstetrics and Gynecology

## 2015-05-27 ENCOUNTER — Ambulatory Visit: Payer: Medicare Other | Admitting: Urology

## 2015-06-03 ENCOUNTER — Other Ambulatory Visit: Payer: Self-pay | Admitting: Family Medicine

## 2015-07-19 ENCOUNTER — Encounter: Payer: Self-pay | Admitting: Family Medicine

## 2015-07-19 ENCOUNTER — Other Ambulatory Visit: Payer: Self-pay | Admitting: Family Medicine

## 2015-07-19 ENCOUNTER — Ambulatory Visit (INDEPENDENT_AMBULATORY_CARE_PROVIDER_SITE_OTHER): Payer: Medicare Other | Admitting: Family Medicine

## 2015-07-19 VITALS — BP 136/74 | HR 85 | Temp 98.0°F | Resp 15 | Ht 65.0 in | Wt 164.0 lb

## 2015-07-19 DIAGNOSIS — E785 Hyperlipidemia, unspecified: Secondary | ICD-10-CM | POA: Diagnosis not present

## 2015-07-19 DIAGNOSIS — R351 Nocturia: Secondary | ICD-10-CM

## 2015-07-19 DIAGNOSIS — N401 Enlarged prostate with lower urinary tract symptoms: Secondary | ICD-10-CM

## 2015-07-19 DIAGNOSIS — I10 Essential (primary) hypertension: Secondary | ICD-10-CM | POA: Diagnosis not present

## 2015-07-19 DIAGNOSIS — E1165 Type 2 diabetes mellitus with hyperglycemia: Secondary | ICD-10-CM | POA: Diagnosis not present

## 2015-07-19 DIAGNOSIS — IMO0001 Reserved for inherently not codable concepts without codable children: Secondary | ICD-10-CM

## 2015-07-19 LAB — POCT GLYCOSYLATED HEMOGLOBIN (HGB A1C): HEMOGLOBIN A1C: 7.2

## 2015-07-19 LAB — POCT UA - MICROALBUMIN
Albumin/Creatinine Ratio, Urine, POC: NEGATIVE
Creatinine, POC: NEGATIVE mg/dL
MICROALBUMIN (UR) POC: NEGATIVE mg/L

## 2015-07-19 LAB — GLUCOSE, POCT (MANUAL RESULT ENTRY): POC Glucose: 172 mg/dl — AB (ref 70–99)

## 2015-07-19 MED ORDER — FINASTERIDE 5 MG PO TABS: ORAL_TABLET | ORAL | Status: DC

## 2015-07-19 MED ORDER — METFORMIN HCL 1000 MG PO TABS: 1000.0000 mg | ORAL_TABLET | Freq: Two times a day (BID) | ORAL | Status: DC

## 2015-07-19 MED ORDER — GLIMEPIRIDE 2 MG PO TABS: ORAL_TABLET | ORAL | Status: DC

## 2015-07-19 MED ORDER — TAMSULOSIN HCL 0.4 MG PO CAPS: 0.4000 mg | ORAL_CAPSULE | Freq: Every day | ORAL | Status: DC

## 2015-07-19 MED ORDER — GABAPENTIN 300 MG PO CAPS: 300.0000 mg | ORAL_CAPSULE | Freq: Four times a day (QID) | ORAL | Status: DC

## 2015-07-19 MED ORDER — VALSARTAN-HYDROCHLOROTHIAZIDE 160-25 MG PO TABS: 1.0000 | ORAL_TABLET | Freq: Every day | ORAL | Status: DC
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
## 2015-07-20 LAB — COMPREHENSIVE METABOLIC PANEL
A/G RATIO: 1.8 (ref 1.2–2.2)
ALT: 33 IU/L (ref 0–44)
AST: 22 IU/L (ref 0–40)
Albumin: 4.9 g/dL — ABNORMAL HIGH (ref 3.5–4.8)
Alkaline Phosphatase: 63 IU/L (ref 39–117)
BUN/Creatinine Ratio: 17 (ref 10–24)
BUN: 14 mg/dL (ref 8–27)
Bilirubin Total: 0.5 mg/dL (ref 0.0–1.2)
CALCIUM: 9.6 mg/dL (ref 8.6–10.2)
CO2: 25 mmol/L (ref 18–29)
Chloride: 97 mmol/L (ref 96–106)
Creatinine, Ser: 0.83 mg/dL (ref 0.76–1.27)
GFR calc Af Amer: 103 mL/min/{1.73_m2} (ref >59)
GFR, EST NON AFRICAN AMERICAN: 89 mL/min/{1.73_m2} (ref >59)
GLOBULIN, TOTAL: 2.7 g/dL (ref 1.5–4.5)
Glucose: 163 mg/dL — ABNORMAL HIGH (ref 65–99)
POTASSIUM: 4.5 mmol/L (ref 3.5–5.2)
SODIUM: 138 mmol/L (ref 134–144)
Total Protein: 7.6 g/dL (ref 6.0–8.5)

## 2015-07-20 LAB — LIPID PANEL WITH LDL/HDL RATIO
Cholesterol, Total: 140 mg/dL (ref 100–199)
HDL: 70 mg/dL (ref >39)
LDL CALC: 48 mg/dL (ref 0–99)
LDl/HDL Ratio: 0.7 ratio units (ref 0.0–3.6)
TRIGLYCERIDES: 110 mg/dL (ref 0–149)
VLDL Cholesterol Cal: 22 mg/dL (ref 5–40)

## 2015-09-24 ENCOUNTER — Ambulatory Visit (INDEPENDENT_AMBULATORY_CARE_PROVIDER_SITE_OTHER): Payer: Medicare Other | Admitting: Family Medicine

## 2015-09-24 ENCOUNTER — Encounter: Payer: Self-pay | Admitting: Family Medicine

## 2015-09-24 DIAGNOSIS — J209 Acute bronchitis, unspecified: Secondary | ICD-10-CM | POA: Diagnosis not present

## 2015-09-24 MED ORDER — AZITHROMYCIN 250 MG PO TABS: ORAL_TABLET | ORAL | 0 refills | Status: DC

## 2015-09-24 MED ORDER — BENZONATATE 200 MG PO CAPS
200.0000 mg | ORAL_CAPSULE | Freq: Three times a day (TID) | ORAL | 0 refills | Status: DC | PRN
Start: 2015-09-24 — End: 2015-10-21
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
## 2015-10-21 ENCOUNTER — Encounter: Payer: Self-pay | Admitting: Family Medicine

## 2015-10-21 ENCOUNTER — Ambulatory Visit (INDEPENDENT_AMBULATORY_CARE_PROVIDER_SITE_OTHER): Payer: Medicare Other | Admitting: Family Medicine

## 2015-10-21 VITALS — BP 116/68 | HR 95 | Temp 99.2°F | Resp 16 | Ht 65.0 in | Wt 164.8 lb

## 2015-10-21 DIAGNOSIS — R351 Nocturia: Secondary | ICD-10-CM | POA: Diagnosis not present

## 2015-10-21 DIAGNOSIS — N401 Enlarged prostate with lower urinary tract symptoms: Secondary | ICD-10-CM | POA: Diagnosis not present

## 2015-10-21 DIAGNOSIS — I10 Essential (primary) hypertension: Secondary | ICD-10-CM

## 2015-10-21 DIAGNOSIS — E119 Type 2 diabetes mellitus without complications: Secondary | ICD-10-CM | POA: Diagnosis not present

## 2015-10-21 LAB — GLUCOSE, POCT (MANUAL RESULT ENTRY): POC Glucose: 149 mg/dl — AB (ref 70–99)

## 2015-10-21 LAB — POCT GLYCOSYLATED HEMOGLOBIN (HGB A1C): Hemoglobin A1C: 7.1

## 2015-10-21 MED ORDER — FINASTERIDE 5 MG PO TABS: ORAL_TABLET | ORAL | 1 refills | Status: DC

## 2015-10-21 MED ORDER — TAMSULOSIN HCL 0.4 MG PO CAPS: 0.4000 mg | ORAL_CAPSULE | Freq: Every day | ORAL | 1 refills | Status: DC

## 2015-10-21 MED ORDER — VALSARTAN-HYDROCHLOROTHIAZIDE 160-25 MG PO TABS: 1.0000 | ORAL_TABLET | Freq: Every day | ORAL | 1 refills | Status: DC

## 2015-10-21 MED ORDER — METFORMIN HCL 1000 MG PO TABS
1000.0000 mg | ORAL_TABLET | Freq: Two times a day (BID) | ORAL | 1 refills | Status: DC
Start: 2015-10-21 — End: 2016-01-21

## 2015-10-21 MED ORDER — GLIMEPIRIDE 2 MG PO TABS: ORAL_TABLET | ORAL | 1 refills | Status: DC

## 2015-10-21 MED ORDER — GABAPENTIN 300 MG PO CAPS: 300.0000 mg | ORAL_CAPSULE | Freq: Four times a day (QID) | ORAL | 1 refills | Status: DC
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
## 2016-01-21 ENCOUNTER — Ambulatory Visit (INDEPENDENT_AMBULATORY_CARE_PROVIDER_SITE_OTHER): Payer: Medicare Other | Admitting: Family Medicine

## 2016-01-21 ENCOUNTER — Encounter: Payer: Self-pay | Admitting: Family Medicine

## 2016-01-21 VITALS — BP 115/61 | HR 86 | Temp 99.0°F | Resp 15 | Ht 65.0 in | Wt 165.9 lb

## 2016-01-21 DIAGNOSIS — N401 Enlarged prostate with lower urinary tract symptoms: Secondary | ICD-10-CM | POA: Diagnosis not present

## 2016-01-21 DIAGNOSIS — I10 Essential (primary) hypertension: Secondary | ICD-10-CM

## 2016-01-21 DIAGNOSIS — E785 Hyperlipidemia, unspecified: Secondary | ICD-10-CM

## 2016-01-21 DIAGNOSIS — R351 Nocturia: Secondary | ICD-10-CM

## 2016-01-21 DIAGNOSIS — G8928 Other chronic postprocedural pain: Secondary | ICD-10-CM | POA: Insufficient documentation

## 2016-01-21 DIAGNOSIS — E1165 Type 2 diabetes mellitus with hyperglycemia: Secondary | ICD-10-CM | POA: Diagnosis not present

## 2016-01-21 DIAGNOSIS — IMO0001 Reserved for inherently not codable concepts without codable children: Secondary | ICD-10-CM

## 2016-01-21 LAB — GLUCOSE, POCT (MANUAL RESULT ENTRY): POC GLUCOSE: 101 mg/dL — AB (ref 70–99)

## 2016-01-21 LAB — POCT GLYCOSYLATED HEMOGLOBIN (HGB A1C): HEMOGLOBIN A1C: 7.4

## 2016-01-21 MED ORDER — GABAPENTIN 300 MG PO CAPS: 300.0000 mg | ORAL_CAPSULE | Freq: Three times a day (TID) | ORAL | 1 refills | Status: DC

## 2016-01-21 MED ORDER — GLUCOSE BLOOD VI STRP: ORAL_STRIP | 11 refills | Status: DC

## 2016-01-21 MED ORDER — GLIMEPIRIDE 2 MG PO TABS: ORAL_TABLET | ORAL | 1 refills | Status: DC

## 2016-01-21 MED ORDER — EMPAGLIFLOZIN 25 MG PO TABS: 25.0000 mg | ORAL_TABLET | Freq: Every day | ORAL | 1 refills | Status: DC

## 2016-01-21 MED ORDER — TAMSULOSIN HCL 0.4 MG PO CAPS: 0.4000 mg | ORAL_CAPSULE | Freq: Every day | ORAL | 1 refills | Status: DC

## 2016-01-21 MED ORDER — CELECOXIB 200 MG PO CAPS: 200.0000 mg | ORAL_CAPSULE | Freq: Two times a day (BID) | ORAL | 2 refills | Status: DC

## 2016-01-21 MED ORDER — VALSARTAN-HYDROCHLOROTHIAZIDE 160-25 MG PO TABS: 1.0000 | ORAL_TABLET | Freq: Every day | ORAL | 1 refills | Status: DC

## 2016-01-21 MED ORDER — CRESTOR 5 MG PO TABS: 5.0000 mg | ORAL_TABLET | Freq: Every day | ORAL | 1 refills | Status: DC

## 2016-01-21 MED ORDER — METFORMIN HCL 1000 MG PO TABS: 1000.0000 mg | ORAL_TABLET | Freq: Two times a day (BID) | ORAL | 1 refills | Status: DC

## 2016-01-21 MED ORDER — FINASTERIDE 5 MG PO TABS: ORAL_TABLET | ORAL | 1 refills | Status: DC
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
## 2016-03-09 ENCOUNTER — Telehealth: Payer: Self-pay | Admitting: Family Medicine

## 2016-03-09 NOTE — Telephone Encounter (Signed)
Called Pt to schedule AWV with [facility] - knb °

## 2016-03-24 ENCOUNTER — Ambulatory Visit (INDEPENDENT_AMBULATORY_CARE_PROVIDER_SITE_OTHER): Payer: Medicare Other | Admitting: Family Medicine

## 2016-03-24 ENCOUNTER — Encounter: Payer: Self-pay | Admitting: Family Medicine

## 2016-03-24 DIAGNOSIS — N481 Balanitis: Secondary | ICD-10-CM | POA: Diagnosis not present

## 2016-03-24 DIAGNOSIS — N4889 Other specified disorders of penis: Secondary | ICD-10-CM | POA: Insufficient documentation

## 2016-03-24 MED ORDER — MICONAZOLE NITRATE 2 % EX OINT: 1.0000 "application " | TOPICAL_OINTMENT | Freq: Two times a day (BID) | CUTANEOUS | 0 refills | Status: DC
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
## 2016-03-25 ENCOUNTER — Ambulatory Visit: Payer: Medicare Other | Admitting: Family Medicine

## 2016-04-01 ENCOUNTER — Ambulatory Visit (INDEPENDENT_AMBULATORY_CARE_PROVIDER_SITE_OTHER): Payer: Medicare Other | Admitting: Family Medicine

## 2016-04-01 ENCOUNTER — Encounter: Payer: Self-pay | Admitting: Family Medicine

## 2016-04-01 VITALS — BP 134/80 | HR 76 | Temp 98.3°F | Resp 16 | Ht 65.0 in | Wt 167.9 lb

## 2016-04-01 DIAGNOSIS — N429 Disorder of prostate, unspecified: Secondary | ICD-10-CM | POA: Diagnosis not present

## 2016-04-01 DIAGNOSIS — Z0000 Encounter for general adult medical examination without abnormal findings: Secondary | ICD-10-CM | POA: Diagnosis not present

## 2016-04-01 DIAGNOSIS — E559 Vitamin D deficiency, unspecified: Secondary | ICD-10-CM | POA: Diagnosis not present

## 2016-04-01 DIAGNOSIS — I10 Essential (primary) hypertension: Secondary | ICD-10-CM | POA: Diagnosis not present

## 2016-04-01 DIAGNOSIS — Z1159 Encounter for screening for other viral diseases: Secondary | ICD-10-CM | POA: Diagnosis not present

## 2016-04-01 LAB — CBC WITH DIFFERENTIAL/PLATELET
Basophils Absolute: 0 cells/uL (ref 0–200)
Basophils Relative: 0 %
EOS ABS: 124 {cells}/uL (ref 15–500)
Eosinophils Relative: 2 %
HEMATOCRIT: 44 % (ref 38.5–50.0)
HEMOGLOBIN: 14.7 g/dL (ref 13.2–17.1)
LYMPHS ABS: 2790 {cells}/uL (ref 850–3900)
Lymphocytes Relative: 45 %
MCH: 31.1 pg (ref 27.0–33.0)
MCHC: 33.4 g/dL (ref 32.0–36.0)
MCV: 93.2 fL (ref 80.0–100.0)
MONO ABS: 620 {cells}/uL (ref 200–950)
MPV: 10.5 fL (ref 7.5–12.5)
Monocytes Relative: 10 %
NEUTROS PCT: 43 %
Neutro Abs: 2666 cells/uL (ref 1500–7800)
Platelets: 217 10*3/uL (ref 140–400)
RBC: 4.72 MIL/uL (ref 4.20–5.80)
RDW: 13.3 % (ref 11.0–15.0)
WBC: 6.2 10*3/uL (ref 3.8–10.8)

## 2016-04-01 LAB — PSA: PSA: 0.6 ng/mL (ref <=4.0)

## 2016-04-01 LAB — TSH: TSH: 1.47 mIU/L (ref 0.40–4.50)
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
## 2016-04-02 LAB — HEPATITIS C ANTIBODY: HCV Ab: NEGATIVE

## 2016-04-02 LAB — VITAMIN D 25 HYDROXY (VIT D DEFICIENCY, FRACTURES): VIT D 25 HYDROXY: 35 ng/mL (ref 30–100)

## 2016-04-21 ENCOUNTER — Ambulatory Visit: Payer: Medicare Other | Admitting: Family Medicine

## 2016-05-05 ENCOUNTER — Ambulatory Visit (INDEPENDENT_AMBULATORY_CARE_PROVIDER_SITE_OTHER): Payer: Medicare Other | Admitting: Family Medicine

## 2016-05-05 ENCOUNTER — Encounter: Payer: Self-pay | Admitting: Family Medicine

## 2016-05-05 VITALS — BP 138/70 | HR 92 | Temp 98.3°F | Resp 17 | Ht 65.0 in | Wt 171.7 lb

## 2016-05-05 DIAGNOSIS — I10 Essential (primary) hypertension: Secondary | ICD-10-CM

## 2016-05-05 DIAGNOSIS — N401 Enlarged prostate with lower urinary tract symptoms: Secondary | ICD-10-CM

## 2016-05-05 DIAGNOSIS — R351 Nocturia: Secondary | ICD-10-CM

## 2016-05-05 DIAGNOSIS — E785 Hyperlipidemia, unspecified: Secondary | ICD-10-CM | POA: Diagnosis not present

## 2016-05-05 DIAGNOSIS — IMO0001 Reserved for inherently not codable concepts without codable children: Secondary | ICD-10-CM

## 2016-05-05 DIAGNOSIS — E1165 Type 2 diabetes mellitus with hyperglycemia: Secondary | ICD-10-CM | POA: Diagnosis not present

## 2016-05-05 MED ORDER — GABAPENTIN 300 MG PO CAPS: 300.0000 mg | ORAL_CAPSULE | Freq: Three times a day (TID) | ORAL | 1 refills | Status: DC

## 2016-05-05 MED ORDER — FINASTERIDE 5 MG PO TABS: ORAL_TABLET | ORAL | 1 refills | Status: DC

## 2016-05-05 MED ORDER — GLIMEPIRIDE 2 MG PO TABS: ORAL_TABLET | ORAL | 1 refills | Status: DC

## 2016-05-05 MED ORDER — METFORMIN HCL 1000 MG PO TABS: 1000.0000 mg | ORAL_TABLET | Freq: Two times a day (BID) | ORAL | 1 refills | Status: DC

## 2016-05-05 MED ORDER — VALSARTAN-HYDROCHLOROTHIAZIDE 160-25 MG PO TABS: 1.0000 | ORAL_TABLET | Freq: Every day | ORAL | 1 refills | Status: DC

## 2016-05-05 MED ORDER — TAMSULOSIN HCL 0.4 MG PO CAPS: 0.4000 mg | ORAL_CAPSULE | Freq: Every day | ORAL | 1 refills | Status: DC
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
## 2016-05-06 LAB — COMPLETE METABOLIC PANEL WITH GFR
AG Ratio: 1.7 Ratio (ref 1.0–2.5)
ALBUMIN: 4.3 g/dL (ref 3.6–5.1)
ALT: 27 U/L (ref 9–46)
AST: 24 U/L (ref 10–35)
Alkaline Phosphatase: 57 U/L (ref 40–115)
BUN / CREAT RATIO: 18.8 ratio (ref 6–22)
BUN: 16 mg/dL (ref 7–25)
CALCIUM: 9.3 mg/dL (ref 8.6–10.3)
CHLORIDE: 102 mmol/L (ref 98–110)
CO2: 27 mmol/L (ref 20–31)
Creat: 0.85 mg/dL (ref 0.70–1.18)
GFR, Est African American: >89 mL/min (ref >=60)
GFR, Est Non African American: 88 mL/min (ref >=60)
Globulin: 2.5 g/dL (ref 1.9–3.7)
Glucose, Bld: 86 mg/dL (ref 65–99)
POTASSIUM: 3.8 mmol/L (ref 3.5–5.3)
SODIUM: 138 mmol/L (ref 135–146)
TOTAL PROTEIN: 6.8 g/dL (ref 6.1–8.1)
Total Bilirubin: 0.6 mg/dL (ref 0.2–1.2)

## 2016-05-06 LAB — POCT GLYCOSYLATED HEMOGLOBIN (HGB A1C): HEMOGLOBIN A1C: 7.7

## 2016-05-06 LAB — LIPID PANEL
CHOL/HDL RATIO: 1.7 ratio (ref <5.0)
CHOLESTEROL: 108 mg/dL (ref <200)
HDL: 63 mg/dL (ref >40)
LDL CALC: 32 mg/dL (ref <100)
Triglycerides: 65 mg/dL (ref <150)
VLDL: 13 mg/dL (ref <30)

## 2016-05-06 LAB — GLUCOSE, POCT (MANUAL RESULT ENTRY): POC GLUCOSE: 61 mg/dL — AB (ref 70–99)

## 2016-08-04 ENCOUNTER — Ambulatory Visit (INDEPENDENT_AMBULATORY_CARE_PROVIDER_SITE_OTHER): Payer: Medicare Other | Admitting: Family Medicine

## 2016-08-04 ENCOUNTER — Encounter: Payer: Self-pay | Admitting: Family Medicine

## 2016-08-04 VITALS — BP 136/74 | HR 84 | Temp 98.4°F | Resp 16 | Ht 65.0 in | Wt 165.7 lb

## 2016-08-04 DIAGNOSIS — E1165 Type 2 diabetes mellitus with hyperglycemia: Secondary | ICD-10-CM

## 2016-08-04 DIAGNOSIS — I10 Essential (primary) hypertension: Secondary | ICD-10-CM

## 2016-08-04 DIAGNOSIS — G8928 Other chronic postprocedural pain: Secondary | ICD-10-CM | POA: Diagnosis not present

## 2016-08-04 DIAGNOSIS — E785 Hyperlipidemia, unspecified: Secondary | ICD-10-CM | POA: Diagnosis not present

## 2016-08-04 DIAGNOSIS — IMO0001 Reserved for inherently not codable concepts without codable children: Secondary | ICD-10-CM

## 2016-08-04 LAB — POCT GLYCOSYLATED HEMOGLOBIN (HGB A1C): HEMOGLOBIN A1C: 8.1

## 2016-08-04 LAB — GLUCOSE, POCT (MANUAL RESULT ENTRY): POC GLUCOSE: 156 mg/dL — AB (ref 70–99)

## 2016-08-04 MED ORDER — EMPAGLIFLOZIN 25 MG PO TABS: 25.0000 mg | ORAL_TABLET | Freq: Every day | ORAL | 1 refills | Status: DC

## 2016-08-04 MED ORDER — METFORMIN HCL 1000 MG PO TABS: 1000.0000 mg | ORAL_TABLET | Freq: Two times a day (BID) | ORAL | 1 refills | Status: DC

## 2016-08-04 MED ORDER — OLMESARTAN MEDOXOMIL-HCTZ 40-25 MG PO TABS: 1.0000 | ORAL_TABLET | Freq: Every day | ORAL | 0 refills | Status: DC

## 2016-08-04 MED ORDER — GABAPENTIN 300 MG PO CAPS: 300.0000 mg | ORAL_CAPSULE | Freq: Three times a day (TID) | ORAL | 1 refills | Status: DC

## 2016-08-04 MED ORDER — SITAGLIPTIN PHOSPHATE 100 MG PO TABS: 100.0000 mg | ORAL_TABLET | Freq: Every day | ORAL | 0 refills | Status: DC

## 2016-08-04 MED ORDER — CRESTOR 5 MG PO TABS: 5.0000 mg | ORAL_TABLET | Freq: Every day | ORAL | 1 refills | Status: DC
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
## 2016-08-05 LAB — MICROALBUMIN / CREATININE URINE RATIO
Creatinine, Urine: 190 mg/dL (ref 20–370)
Microalb Creat Ratio: 5 mcg/mg creat (ref <30)
Microalb, Ur: 0.9 mg/dL

## 2016-09-22 ENCOUNTER — Other Ambulatory Visit: Payer: Self-pay | Admitting: Family Medicine

## 2016-09-22 DIAGNOSIS — I10 Essential (primary) hypertension: Secondary | ICD-10-CM

## 2016-11-04 ENCOUNTER — Ambulatory Visit (INDEPENDENT_AMBULATORY_CARE_PROVIDER_SITE_OTHER): Payer: Medicare Other | Admitting: Family Medicine

## 2016-11-04 ENCOUNTER — Encounter: Payer: Self-pay | Admitting: Family Medicine

## 2016-11-04 VITALS — BP 124/62 | HR 69 | Temp 98.0°F | Resp 14 | Ht 65.0 in | Wt 164.7 lb

## 2016-11-04 DIAGNOSIS — I10 Essential (primary) hypertension: Secondary | ICD-10-CM | POA: Diagnosis not present

## 2016-11-04 DIAGNOSIS — E119 Type 2 diabetes mellitus without complications: Secondary | ICD-10-CM | POA: Diagnosis not present

## 2016-11-04 DIAGNOSIS — E7800 Pure hypercholesterolemia, unspecified: Secondary | ICD-10-CM | POA: Diagnosis not present

## 2016-11-04 DIAGNOSIS — N529 Male erectile dysfunction, unspecified: Secondary | ICD-10-CM

## 2016-11-04 LAB — POCT GLYCOSYLATED HEMOGLOBIN (HGB A1C)

## 2016-11-04 LAB — GLUCOSE, POCT (MANUAL RESULT ENTRY): POC GLUCOSE: 168 mg/dL — AB (ref 70–99)

## 2016-11-04 MED ORDER — TADALAFIL 10 MG PO TABS: ORAL_TABLET | ORAL | 0 refills | Status: DC

## 2016-11-04 MED ORDER — OLMESARTAN MEDOXOMIL-HCTZ 40-25 MG PO TABS: 1.0000 | ORAL_TABLET | Freq: Every day | ORAL | 0 refills | Status: DC

## 2016-11-04 MED ORDER — SITAGLIPTIN PHOSPHATE 100 MG PO TABS: 100.0000 mg | ORAL_TABLET | Freq: Every day | ORAL | 0 refills | Status: DC
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
## 2016-11-05 LAB — COMPLETE METABOLIC PANEL WITH GFR
AG Ratio: 1.8 (calc) (ref 1.0–2.5)
ALBUMIN MSPROF: 4.6 g/dL (ref 3.6–5.1)
ALKALINE PHOSPHATASE (APISO): 58 U/L (ref 40–115)
ALT: 26 U/L (ref 9–46)
AST: 18 U/L (ref 10–35)
BILIRUBIN TOTAL: 0.6 mg/dL (ref 0.2–1.2)
BUN: 18 mg/dL (ref 7–25)
CHLORIDE: 102 mmol/L (ref 98–110)
CO2: 26 mmol/L (ref 20–32)
CREATININE: 0.73 mg/dL (ref 0.70–1.18)
Calcium: 9.2 mg/dL (ref 8.6–10.3)
GFR, Est African American: 108 mL/min/{1.73_m2} (ref >=60)
GFR, Est Non African American: 93 mL/min/{1.73_m2} (ref >=60)
GLOBULIN: 2.5 g/dL (ref 1.9–3.7)
Glucose, Bld: 152 mg/dL — ABNORMAL HIGH (ref 65–99)
Potassium: 4.1 mmol/L (ref 3.5–5.3)
SODIUM: 137 mmol/L (ref 135–146)
Total Protein: 7.1 g/dL (ref 6.1–8.1)

## 2016-11-05 LAB — TESTOSTERONE: Testosterone: 326 ng/dL (ref 250–827)

## 2016-11-05 LAB — LIPID PANEL
CHOLESTEROL: 103 mg/dL (ref <200)
HDL: 67 mg/dL (ref >40)
LDL CHOLESTEROL (CALC): 21 mg/dL
Non-HDL Cholesterol (Calc): 36 mg/dL (calc) (ref <130)
TRIGLYCERIDES: 74 mg/dL (ref <150)
Total CHOL/HDL Ratio: 1.5 (calc) (ref <5.0)

## 2016-11-23 ENCOUNTER — Other Ambulatory Visit: Payer: Self-pay | Admitting: Family Medicine

## 2016-11-23 DIAGNOSIS — E785 Hyperlipidemia, unspecified: Secondary | ICD-10-CM

## 2016-11-28 ENCOUNTER — Other Ambulatory Visit: Payer: Self-pay | Admitting: Family Medicine

## 2016-11-28 DIAGNOSIS — E785 Hyperlipidemia, unspecified: Secondary | ICD-10-CM

## 2016-12-01 NOTE — Telephone Encounter (Signed)
Copied from [facility] [id number]. Topic: General - Other >> Dec 01, 2016  8:47 AM Carolyn Stare wrote: Reason for CRM:   REFILL   CRESTOR 5 MG tablet   [facility]

## 2016-12-28 LAB — HM DIABETES EYE EXAM

## 2017-01-06 ENCOUNTER — Other Ambulatory Visit: Payer: Self-pay | Admitting: Family Medicine

## 2017-01-06 DIAGNOSIS — E1165 Type 2 diabetes mellitus with hyperglycemia: Secondary | ICD-10-CM

## 2017-01-06 DIAGNOSIS — G8928 Other chronic postprocedural pain: Secondary | ICD-10-CM

## 2017-01-06 DIAGNOSIS — N401 Enlarged prostate with lower urinary tract symptoms: Secondary | ICD-10-CM

## 2017-01-06 DIAGNOSIS — R351 Nocturia: Secondary | ICD-10-CM

## 2017-01-06 DIAGNOSIS — I10 Essential (primary) hypertension: Secondary | ICD-10-CM

## 2017-01-06 DIAGNOSIS — IMO0001 Reserved for inherently not codable concepts without codable children: Secondary | ICD-10-CM

## 2017-02-04 ENCOUNTER — Encounter: Payer: Self-pay | Admitting: Family Medicine

## 2017-02-04 ENCOUNTER — Ambulatory Visit (INDEPENDENT_AMBULATORY_CARE_PROVIDER_SITE_OTHER): Payer: Medicare Other | Admitting: Family Medicine

## 2017-02-04 ENCOUNTER — Ambulatory Visit: Payer: Medicare Other | Admitting: Family Medicine

## 2017-02-04 VITALS — BP 118/68 | HR 85 | Temp 98.4°F | Resp 14 | Wt 167.3 lb

## 2017-02-04 DIAGNOSIS — I10 Essential (primary) hypertension: Secondary | ICD-10-CM

## 2017-02-04 DIAGNOSIS — E7800 Pure hypercholesterolemia, unspecified: Secondary | ICD-10-CM

## 2017-02-04 DIAGNOSIS — E119 Type 2 diabetes mellitus without complications: Secondary | ICD-10-CM | POA: Diagnosis not present

## 2017-02-04 LAB — POCT GLYCOSYLATED HEMOGLOBIN (HGB A1C): Hemoglobin A1C: 7.5

## 2017-02-04 MED ORDER — EMPAGLIFLOZIN 25 MG PO TABS: 25.0000 mg | ORAL_TABLET | Freq: Every day | ORAL | 1 refills | Status: DC

## 2017-02-04 MED ORDER — SITAGLIPTIN PHOSPHATE 100 MG PO TABS: 100.0000 mg | ORAL_TABLET | Freq: Every day | ORAL | 0 refills | Status: DC

## 2017-02-04 MED ORDER — METFORMIN HCL 1000 MG PO TABS: ORAL_TABLET | ORAL | 1 refills | Status: DC

## 2017-02-04 MED ORDER — GLIMEPIRIDE 2 MG PO TABS: 2.0000 mg | ORAL_TABLET | Freq: Every day | ORAL | 0 refills | Status: DC
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
## 2017-02-10 ENCOUNTER — Telehealth: Payer: Self-pay | Admitting: Family Medicine

## 2017-02-19 ENCOUNTER — Ambulatory Visit (INDEPENDENT_AMBULATORY_CARE_PROVIDER_SITE_OTHER): Payer: Medicare Other

## 2017-02-19 VITALS — BP 108/60 | HR 80 | Temp 97.5°F | Resp 12 | Ht 65.0 in | Wt 164.7 lb

## 2017-02-19 DIAGNOSIS — Z23 Encounter for immunization: Secondary | ICD-10-CM | POA: Diagnosis not present

## 2017-02-19 DIAGNOSIS — Z0000 Encounter for general adult medical examination without abnormal findings: Secondary | ICD-10-CM | POA: Diagnosis not present
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
## 2017-02-19 NOTE — Patient Instructions (Signed)
Aaron Calhoun , Thank you for taking time to come for your Medicare Wellness Visit. I appreciate your ongoing commitment to your health goals. Please review the following plan we discussed and let me know if I can assist you in the future.   Screening recommendations/referrals: Colorectal Screening: Completed colonoscopy 05/07/09. Repeat every 10 years. Lung Cancer Screening: You do not qualify for this screening Hepatitis C Screening: Completed 04/01/16 HIV/Syphilis/Hepatitis B Screening: You do not qualify for this screening  Vision/Dental/Diabetic Exams: Diabetic Exams: Recommend annual diabetic eye exams for retinopathy and diabetic foot exams.  Diabetic Eye Exam: Completed 12/28/16 Diabetic Foot Exam: Completed 05/05/16 Recommended yearly ophthalmology/optometry visit for glaucoma screening and checkup Recommended yearly dental visit for hygiene and checkup  Vaccinations: Influenza vaccine: Completed 10/16/16 Pneumococcal vaccine: Completed PCV13 today. Will be due for PPSV23 on or after 02/19/18 Tdap vaccine: Declined. Please call your insurance company to determine your out of pocket expense. You may also receive this vaccine at your local pharmacy or Health Dept. Shingles vaccine: Please call your insurance company to determine your out of pocket expense for the Shingrix vaccine. You may also receive this vaccine at your local pharmacy or Health Dept.    Advanced directives: Advance directive discussed with you today. I have provided a copy for you to complete at home and have notarized. Once this is complete please bring a copy in to our office so we can scan it into your chart.  Conditions/risks identified: Recommend to drink at least 6-8 8oz glasses of water per day.  Next appointment: You are scheduled to see Dr. Sanda Klein on 05/06/17 @ 3:40am.   Please schedule your Annual Wellness Visit with your Nurse Health Advisor in one year.  Preventive Care 7 Years and Older, Male Preventive  care refers to lifestyle choices and visits with your health care provider that can promote health and wellness. What does preventive care include?  A yearly physical exam. This is also called an annual well check.  Dental exams once or twice a year.  Routine eye exams. Ask your health care provider how often you should have your eyes checked.  Personal lifestyle choices, including:  Daily care of your teeth and gums.  Regular physical activity.  Eating a healthy diet.  Avoiding tobacco and drug use.  Limiting alcohol use.  Practicing safe sex.  Taking low doses of aspirin every day.  Taking vitamin and mineral supplements as recommended by your health care provider. What happens during an annual well check? The services and screenings done by your health care provider during your annual well check will depend on your age, overall health, lifestyle risk factors, and family history of disease. Counseling  Your health care provider may ask you questions about your:  Alcohol use.  Tobacco use.  Drug use.  Emotional well-being.  Home and relationship well-being.  Sexual activity.  Eating habits.  History of falls.  Memory and ability to understand (cognition).  Work and work Statistician. Screening  You may have the following tests or measurements:  Height, weight, and BMI.  Blood pressure.  Lipid and cholesterol levels. These may be checked every 5 years, or more frequently if you are over 6 years old.  Skin check.  Lung cancer screening. You may have this screening every year starting at age 54 if you have a 30-pack-year history of smoking and currently smoke or have quit within the past 15 years.  Fecal occult blood test (FOBT) of the stool. You may have this  test every year starting at age 37.  Flexible sigmoidoscopy or colonoscopy. You may have a sigmoidoscopy every 5 years or a colonoscopy every 10 years starting at age 40.  Prostate cancer  screening. Recommendations will vary depending on your family history and other risks.  Hepatitis C blood test.  Hepatitis B blood test.  Sexually transmitted disease (STD) testing.  Diabetes screening. This is done by checking your blood sugar (glucose) after you have not eaten for a while (fasting). You may have this done every 1-3 years.  Abdominal aortic aneurysm (AAA) screening. You may need this if you are a current or former smoker.  Osteoporosis. You may be screened starting at age 49 if you are at high risk. Talk with your health care provider about your test results, treatment options, and if necessary, the need for more tests. Vaccines  Your health care provider may recommend certain vaccines, such as:  Influenza vaccine. This is recommended every year.  Tetanus, diphtheria, and acellular pertussis (Tdap, Td) vaccine. You may need a Td booster every 10 years.  Zoster vaccine. You may need this after age 6.  Pneumococcal 13-valent conjugate (PCV13) vaccine. One dose is recommended after age 21.  Pneumococcal polysaccharide (PPSV23) vaccine. One dose is recommended after age 33. Talk to your health care provider about which screenings and vaccines you need and how often you need them. This information is not intended to replace advice given to you by your health care provider. Make sure you discuss any questions you have with your health care provider. Document Released: 01/25/2015 Document Revised: 09/18/2015 Document Reviewed: 10/30/2014 Elsevier Interactive Patient Education  2017 [place] Prevention in the Home Falls can cause injuries. They can happen to people of all ages. There are many things you can do to make your home safe and to help prevent falls. What can I do on the outside of my home?  Regularly fix the edges of walkways and driveways and fix any cracks.  Remove anything that might make you trip as you walk through a door, such as a raised  step or threshold.  Trim any bushes or trees on the path to your home.  Use bright outdoor lighting.  Clear any walking paths of anything that might make someone trip, such as rocks or tools.  Regularly check to see if handrails are loose or broken. Make sure that both sides of any steps have handrails.  Any raised decks and porches should have guardrails on the edges.  Have any leaves, snow, or ice cleared regularly.  Use sand or salt on walking paths during winter.  Clean up any spills in your garage right away. This includes oil or grease spills. What can I do in the bathroom?  Use night lights.  Install grab bars by the toilet and in the tub and shower. Do not use towel bars as grab bars.  Use non-skid mats or decals in the tub or shower.  If you need to sit down in the shower, use a plastic, non-slip stool.  Keep the floor dry. Clean up any water that spills on the floor as soon as it happens.  Remove soap buildup in the tub or shower regularly.  Attach bath mats securely with double-sided non-slip rug tape.  Do not have throw rugs and other things on the floor that can make you trip. What can I do in the bedroom?  Use night lights.  Make sure that you have a light by your  bed that is easy to reach.  Do not use any sheets or blankets that are too big for your bed. They should not hang down onto the floor.  Have a firm chair that has side arms. You can use this for support while you get dressed.  Do not have throw rugs and other things on the floor that can make you trip. What can I do in the kitchen?  Clean up any spills right away.  Avoid walking on wet floors.  Keep items that you use a lot in easy-to-reach places.  If you need to reach something above you, use a strong step stool that has a grab bar.  Keep electrical cords out of the way.  Do not use floor polish or wax that makes floors slippery. If you must use wax, use non-skid floor wax.  Do not  have throw rugs and other things on the floor that can make you trip. What can I do with my stairs?  Do not leave any items on the stairs.  Make sure that there are handrails on both sides of the stairs and use them. Fix handrails that are broken or loose. Make sure that handrails are as long as the stairways.  Check any carpeting to make sure that it is firmly attached to the stairs. Fix any carpet that is loose or worn.  Avoid having throw rugs at the top or bottom of the stairs. If you do have throw rugs, attach them to the floor with carpet tape.  Make sure that you have a light switch at the top of the stairs and the bottom of the stairs. If you do not have them, ask someone to add them for you. What else can I do to help prevent falls?  Wear shoes that:  Do not have high heels.  Have rubber bottoms.  Are comfortable and fit you well.  Are closed at the toe. Do not wear sandals.  If you use a stepladder:  Make sure that it is fully opened. Do not climb a closed stepladder.  Make sure that both sides of the stepladder are locked into place.  Ask someone to hold it for you, if possible.  Clearly mark and make sure that you can see:  Any grab bars or handrails.  First and last steps.  Where the edge of each step is.  Use tools that help you move around (mobility aids) if they are needed. These include:  Canes.  Walkers.  Scooters.  Crutches.  Turn on the lights when you go into a dark area. Replace any light bulbs as soon as they burn out.  Set up your furniture so you have a clear path. Avoid moving your furniture around.  If any of your floors are uneven, fix them.  If there are any pets around you, be aware of where they are.  Review your medicines with your doctor. Some medicines can make you feel dizzy. This can increase your chance of falling. Ask your doctor what other things that you can do to help prevent falls. This information is not intended  to replace advice given to you by your health care provider. Make sure you discuss any questions you have with your health care provider. Document Released: 10/25/2008 Document Revised: 06/06/2015 Document Reviewed: 02/02/2014 Elsevier Interactive Patient Education  2017 [employer].

## 2017-02-23 NOTE — Telephone Encounter (Signed)
Visit resolved °

## 2017-03-25 ENCOUNTER — Other Ambulatory Visit: Payer: Self-pay

## 2017-03-25 DIAGNOSIS — E119 Type 2 diabetes mellitus without complications: Secondary | ICD-10-CM

## 2017-03-25 MED ORDER — GLIMEPIRIDE 2 MG PO TABS: 2.0000 mg | ORAL_TABLET | Freq: Every day | ORAL | 1 refills | Status: DC

## 2017-03-25 NOTE — Telephone Encounter (Signed)
Dr Manuella Ghazi DEA expired for further refills, pt needs 90 day

## 2017-03-29 ENCOUNTER — Other Ambulatory Visit: Payer: Self-pay

## 2017-03-29 DIAGNOSIS — I10 Essential (primary) hypertension: Secondary | ICD-10-CM

## 2017-03-29 MED ORDER — OLMESARTAN MEDOXOMIL-HCTZ 40-25 MG PO TABS: 1.0000 | ORAL_TABLET | Freq: Every day | ORAL | 0 refills | Status: DC

## 2017-05-06 ENCOUNTER — Ambulatory Visit (INDEPENDENT_AMBULATORY_CARE_PROVIDER_SITE_OTHER): Payer: Medicare Other | Admitting: Family Medicine

## 2017-05-06 ENCOUNTER — Encounter: Payer: Self-pay | Admitting: Family Medicine

## 2017-05-06 VITALS — BP 136/78 | HR 96 | Temp 98.3°F | Ht 65.0 in | Wt 164.1 lb

## 2017-05-06 DIAGNOSIS — I10 Essential (primary) hypertension: Secondary | ICD-10-CM | POA: Diagnosis not present

## 2017-05-06 DIAGNOSIS — E119 Type 2 diabetes mellitus without complications: Secondary | ICD-10-CM

## 2017-05-06 DIAGNOSIS — I499 Cardiac arrhythmia, unspecified: Secondary | ICD-10-CM

## 2017-05-06 DIAGNOSIS — R351 Nocturia: Secondary | ICD-10-CM | POA: Diagnosis not present

## 2017-05-06 DIAGNOSIS — N401 Enlarged prostate with lower urinary tract symptoms: Secondary | ICD-10-CM | POA: Diagnosis not present

## 2017-05-06 DIAGNOSIS — G8928 Other chronic postprocedural pain: Secondary | ICD-10-CM | POA: Diagnosis not present

## 2017-05-06 DIAGNOSIS — N644 Mastodynia: Secondary | ICD-10-CM

## 2017-05-06 DIAGNOSIS — E785 Hyperlipidemia, unspecified: Secondary | ICD-10-CM

## 2017-05-06 MED ORDER — METFORMIN HCL 1000 MG PO TABS: ORAL_TABLET | ORAL | 1 refills | Status: DC

## 2017-05-06 MED ORDER — ROSUVASTATIN CALCIUM 5 MG PO TABS: 5.0000 mg | ORAL_TABLET | Freq: Every day | ORAL | 1 refills | Status: DC

## 2017-05-06 MED ORDER — GABAPENTIN 300 MG PO CAPS: 300.0000 mg | ORAL_CAPSULE | Freq: Three times a day (TID) | ORAL | 1 refills | Status: DC

## 2017-05-06 MED ORDER — FINASTERIDE 5 MG PO TABS: 5.0000 mg | ORAL_TABLET | Freq: Every day | ORAL | 1 refills | Status: DC

## 2017-05-06 MED ORDER — AMOXICILLIN-POT CLAVULANATE 875-125 MG PO TABS: 1.0000 | ORAL_TABLET | Freq: Two times a day (BID) | ORAL | 0 refills | Status: AC

## 2017-05-06 MED ORDER — TAMSULOSIN HCL 0.4 MG PO CAPS: 0.4000 mg | ORAL_CAPSULE | Freq: Every day | ORAL | 1 refills | Status: DC

## 2017-05-06 MED ORDER — OLMESARTAN MEDOXOMIL-HCTZ 40-25 MG PO TABS: 1.0000 | ORAL_TABLET | Freq: Every day | ORAL | 1 refills | Status: DC

## 2017-05-06 NOTE — Patient Instructions (Addendum)
You can try tolnaftate over-the-counter on your feet once a day Try to limit saturated fats in your diet (bologna, hot dogs, barbeque, cheeseburgers, hamburgers, steak, bacon, sausage, cheese, etc.) and get more fresh fruits, vegetables, and whole grains Try to follow the DASH guidelines (DASH stands for Dietary Approaches to Stop Hypertension). Try to limit the sodium in your diet to no more than 1,500mg  of sodium per day. Certainly try to not exceed 2,000 mg per day at the very most. Do not add salt when cooking or at the table.  Check the sodium amount on labels when shopping, and choose items lower in sodium when given a choice. Avoid or limit foods that already contain a lot of sodium. Eat a diet rich in fruits and vegetables and whole grains, and try to lose weight if overweight or obese Please do see your eye doctor regularly, and have your eyes examined every year (or more often per his or her recommendation) Check your feet every night and let me know right away of any sores, infections, numbness, etc. Try to limit sweets, white bread, white rice, white potatoes It is okay with me for you to not check your fingerstick blood sugars (per [employer] of Endocrinology Best Practices), unless you are interested and feel it would be helpful for you Please do eat yogurt or kimchi or take a probiotic daily for the next month We want to replace the healthy germs in the gut If you notice foul, watery diarrhea in the next two months, schedule an appointment RIGHT AWAY or go to an urgent care or the emergency room if a holiday or over a weekend Start the antibiotics
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
## 2017-05-07 LAB — HEMOGLOBIN A1C
Hgb A1c MFr Bld: 7.9 % of total Hgb — ABNORMAL HIGH (ref <5.7)
Mean Plasma Glucose: 180 (calc)
eAG (mmol/L): 10 (calc)

## 2017-05-07 LAB — COMPLETE METABOLIC PANEL WITH GFR
AG Ratio: 1.8 (calc) (ref 1.0–2.5)
ALBUMIN MSPROF: 4.7 g/dL (ref 3.6–5.1)
ALT: 32 U/L (ref 9–46)
AST: 30 U/L (ref 10–35)
Alkaline phosphatase (APISO): 62 U/L (ref 40–115)
BUN: 16 mg/dL (ref 7–25)
CALCIUM: 9.8 mg/dL (ref 8.6–10.3)
CO2: 28 mmol/L (ref 20–32)
CREATININE: 0.9 mg/dL (ref 0.70–1.18)
Chloride: 102 mmol/L (ref 98–110)
GFR, EST NON AFRICAN AMERICAN: 85 mL/min/{1.73_m2} (ref >=60)
GFR, Est African American: 99 mL/min/{1.73_m2} (ref >=60)
GLUCOSE: 88 mg/dL (ref 65–99)
Globulin: 2.6 g/dL (calc) (ref 1.9–3.7)
Potassium: 3.8 mmol/L (ref 3.5–5.3)
Sodium: 140 mmol/L (ref 135–146)
TOTAL PROTEIN: 7.3 g/dL (ref 6.1–8.1)
Total Bilirubin: 0.5 mg/dL (ref 0.2–1.2)

## 2017-05-07 LAB — LIPID PANEL
Cholesterol: 135 mg/dL (ref <200)
HDL: 69 mg/dL (ref >40)
LDL CHOLESTEROL (CALC): 52 mg/dL
NON-HDL CHOLESTEROL (CALC): 66 mg/dL (ref <130)
TRIGLYCERIDES: 67 mg/dL (ref <150)
Total CHOL/HDL Ratio: 2 (calc) (ref <5.0)

## 2017-05-07 LAB — MAGNESIUM: Magnesium: 1.6 mg/dL (ref 1.5–2.5)

## 2017-05-13 ENCOUNTER — Telehealth: Payer: Self-pay | Admitting: Family Medicine

## 2017-05-13 ENCOUNTER — Other Ambulatory Visit: Payer: Self-pay | Admitting: Family Medicine

## 2017-05-13 MED ORDER — EMPAGLIFLOZIN 10 MG PO TABS: 10.0000 mg | ORAL_TABLET | Freq: Every day | ORAL | 1 refills | Status: DC

## 2017-05-13 NOTE — Telephone Encounter (Signed)
See result notes. 

## 2017-05-13 NOTE — Telephone Encounter (Signed)
Copied from [place] [id number]. Topic: Quick Communication - Lab Results >> May 13, 2017 12:36 PM Chilton Greathouse, CMA wrote: Patient called.  Unable to reach patient. If patient calls back please inform her of her most recent lab results.   PT called for lab results

## 2017-05-13 NOTE — Progress Notes (Signed)
jardiance 

## 2017-05-17 ENCOUNTER — Telehealth: Payer: Self-pay | Admitting: Family Medicine

## 2017-05-17 NOTE — Telephone Encounter (Signed)
Pt would like Jardiance to be sent to [address]. The other RX needs to keep going to [facility]

## 2017-05-17 NOTE — Telephone Encounter (Signed)
Called into [facility]

## 2017-08-03 LAB — HM DIABETES EYE EXAM

## 2017-08-06 ENCOUNTER — Ambulatory Visit: Payer: Medicare Other | Admitting: Family Medicine

## 2017-08-11 ENCOUNTER — Encounter: Payer: Self-pay | Admitting: Family Medicine

## 2017-08-11 ENCOUNTER — Ambulatory Visit (INDEPENDENT_AMBULATORY_CARE_PROVIDER_SITE_OTHER): Payer: Medicare Other | Admitting: Family Medicine

## 2017-08-11 ENCOUNTER — Other Ambulatory Visit: Payer: Self-pay | Admitting: Family Medicine

## 2017-08-11 VITALS — BP 130/70 | HR 73 | Temp 98.0°F | Resp 16 | Ht 65.0 in | Wt 165.5 lb

## 2017-08-11 DIAGNOSIS — N644 Mastodynia: Secondary | ICD-10-CM | POA: Diagnosis not present

## 2017-08-11 DIAGNOSIS — I10 Essential (primary) hypertension: Secondary | ICD-10-CM

## 2017-08-11 DIAGNOSIS — E119 Type 2 diabetes mellitus without complications: Secondary | ICD-10-CM | POA: Diagnosis not present

## 2017-08-11 DIAGNOSIS — N401 Enlarged prostate with lower urinary tract symptoms: Secondary | ICD-10-CM | POA: Diagnosis not present

## 2017-08-11 DIAGNOSIS — R351 Nocturia: Secondary | ICD-10-CM

## 2017-08-11 MED ORDER — METFORMIN HCL 1000 MG PO TABS: ORAL_TABLET | ORAL | 1 refills | Status: DC

## 2017-08-11 MED ORDER — FINASTERIDE 5 MG PO TABS: 5.0000 mg | ORAL_TABLET | Freq: Every day | ORAL | 1 refills | Status: DC

## 2017-08-11 MED ORDER — OLMESARTAN MEDOXOMIL-HCTZ 40-25 MG PO TABS: 1.0000 | ORAL_TABLET | Freq: Every day | ORAL | 1 refills | Status: DC

## 2017-08-11 MED ORDER — GLIMEPIRIDE 2 MG PO TABS: 2.0000 mg | ORAL_TABLET | Freq: Every day | ORAL | 1 refills | Status: DC

## 2017-08-11 MED ORDER — TAMSULOSIN HCL 0.4 MG PO CAPS: 0.4000 mg | ORAL_CAPSULE | Freq: Every day | ORAL | 1 refills | Status: DC
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
## 2017-08-11 NOTE — Patient Instructions (Signed)
Please do call to schedule your mammogram; the number to schedule one at either [facility] or [facility] is [phone] If you have not heard anything from my staff in a week about any orders/referrals/studies from today, please contact us here to follow-up [phone]  Keep up the good work with healthy eating habitsPlease do see your eye doctor regularly, and have your eyes examined every year (or more often per his or her recommendation) Check your feet every night and let me know right away of any sores, infections, numbness, etc. Try to limit sweets, white bread, white rice, white potatoes

## 2017-08-12 LAB — HEMOGLOBIN A1C
Hgb A1c MFr Bld: 8.3 % of total Hgb — ABNORMAL HIGH (ref <5.7)
Mean Plasma Glucose: 192 (calc)
eAG (mmol/L): 10.6 (calc)

## 2017-08-12 LAB — PSA: PSA: 0.3 ng/mL (ref <=4.0)

## 2017-08-12 LAB — MICROALBUMIN / CREATININE URINE RATIO
Creatinine, Urine: 100 mg/dL (ref 20–320)
Microalb Creat Ratio: 9 mcg/mg creat (ref <30)
Microalb, Ur: 0.9 mg/dL

## 2017-08-14 ENCOUNTER — Other Ambulatory Visit: Payer: Self-pay | Admitting: Family Medicine

## 2017-08-14 MED ORDER — GLIMEPIRIDE 4 MG PO TABS: 4.0000 mg | ORAL_TABLET | Freq: Every day | ORAL | 1 refills | Status: DC

## 2017-08-14 MED ORDER — EMPAGLIFLOZIN 25 MG PO TABS: 25.0000 mg | ORAL_TABLET | Freq: Every day | ORAL | 1 refills | Status: DC

## 2017-08-14 NOTE — Progress Notes (Signed)
Increase the amaryl and jardiance

## 2017-08-19 ENCOUNTER — Ambulatory Visit
Admission: RE | Admit: 2017-08-19 | Discharge: 2017-08-19 | Disposition: A | Discharge status: Home / Self Care | Payer: Medicare Other | Source: Ambulatory Visit | Attending: Family Medicine | Admitting: Family Medicine

## 2017-08-19 DIAGNOSIS — N644 Mastodynia: Secondary | ICD-10-CM

## 2017-08-19 DIAGNOSIS — R928 Other abnormal and inconclusive findings on diagnostic imaging of breast: Secondary | ICD-10-CM | POA: Diagnosis not present

## 2017-08-21 ENCOUNTER — Other Ambulatory Visit: Payer: Self-pay | Admitting: Family Medicine

## 2017-08-21 DIAGNOSIS — N62 Hypertrophy of breast: Secondary | ICD-10-CM

## 2017-08-21 DIAGNOSIS — N644 Mastodynia: Secondary | ICD-10-CM

## 2017-08-21 NOTE — Progress Notes (Signed)
Refer to gen surgeon for evaluation, possible excision of unilateral gynecomastia; see mammogram report

## 2017-09-01 ENCOUNTER — Encounter: Payer: Self-pay | Admitting: *Deleted

## 2017-09-20 IMAGING — MR MR LUMBAR SPINE W/O CM
4 of 5 series · 25 of 48 positions shown · non-contrast
Comparison: 12/31/2009

CLINICAL DATA: Low back pain with leg pain

EXAM:
MRI LUMBAR SPINE WITHOUT CONTRAST
TECHNIQUE: Multiplanar, multisequence MR imaging of the lumbar spine was
performed. No intravenous contrast was administered.

[Series 3: T2 · sagittal · 4.0mm · 0.81mm/px · 7 of 17 slices shown (1 of 2)]
[im 1/17]
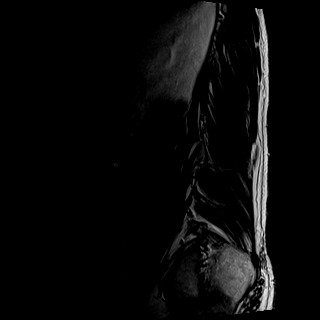
[im 3/17]
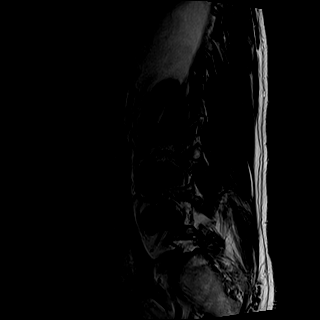
[im 6/17]
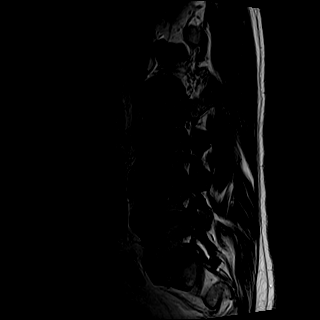
[im 9/17]
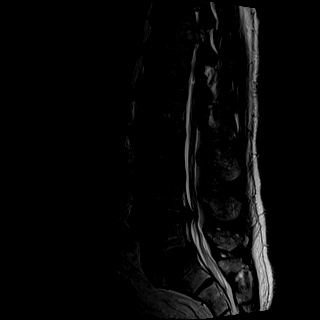
[im 11/17]
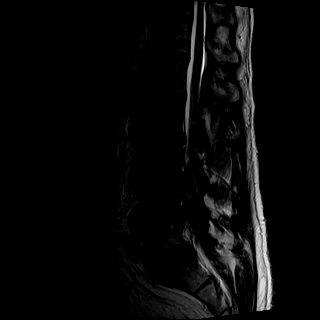
[im 14/17]
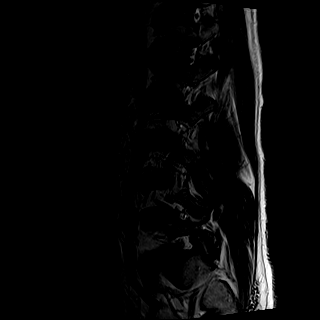
[im 17/17]
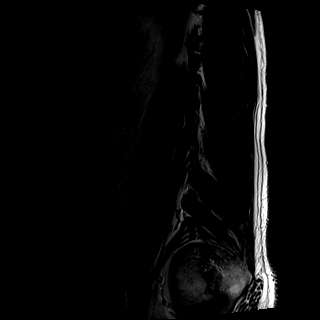

[Series 4: T1 · sagittal · 4.0mm · 0.41mm/px · 6 of 17 slices shown (1 of 2)]
[im 1/17]
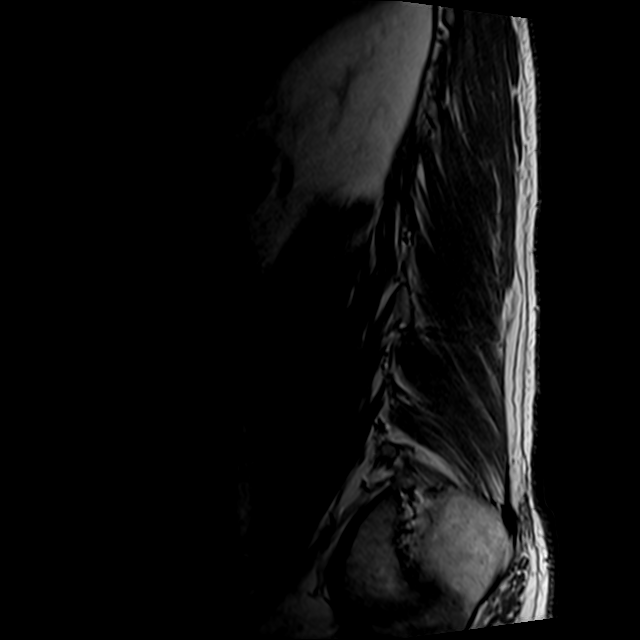
[im 3/17]
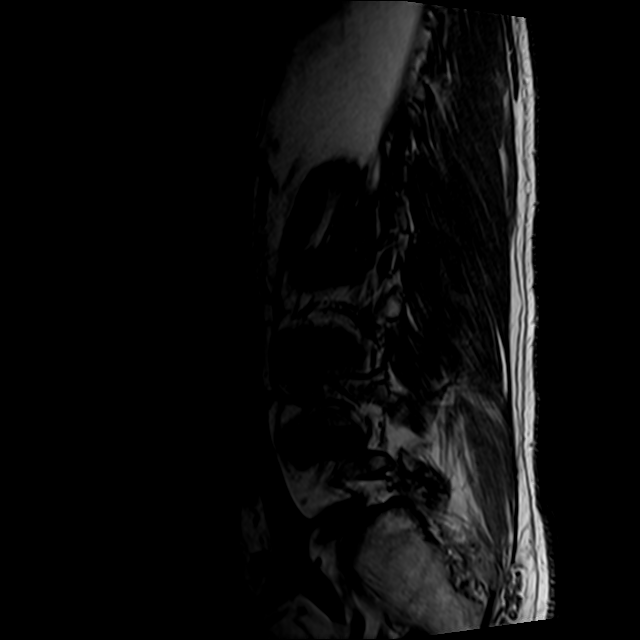
[im 6/17]
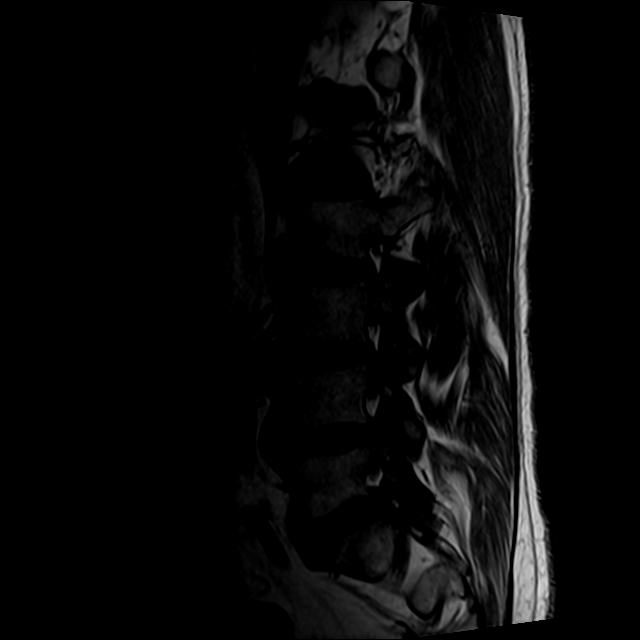
[im 9/17]
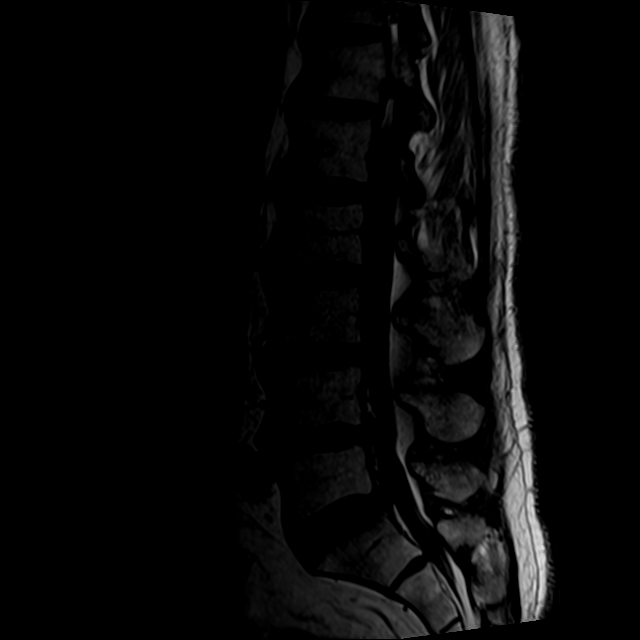
[im 11/17]
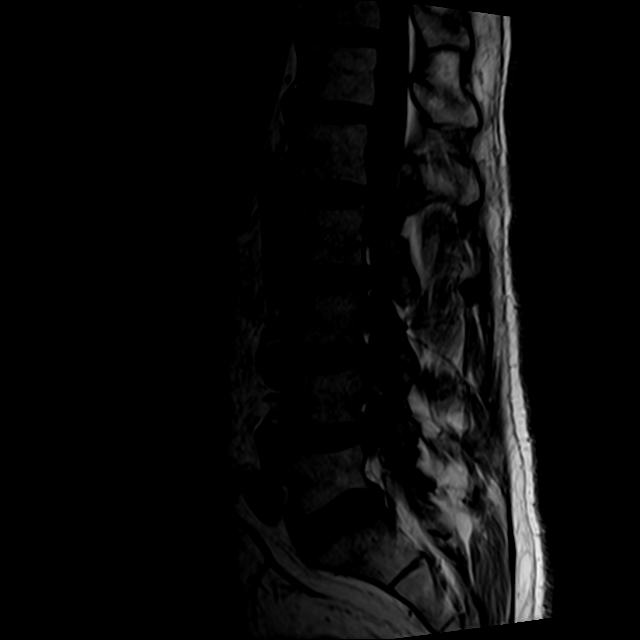
[im 14/17]
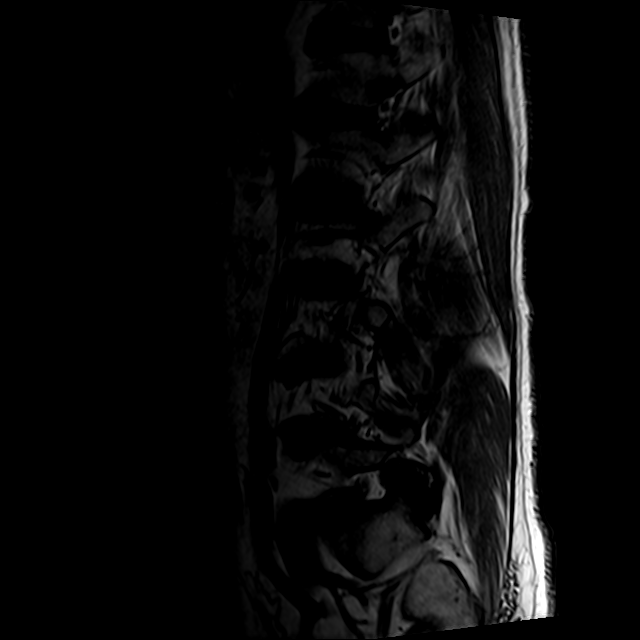

[Series 6: T2 · axial · 4.0mm · 0.78mm/px · z∈[-4,+185]mm · 9 of 33 slices shown (2 of 2)]
[im 1/33]
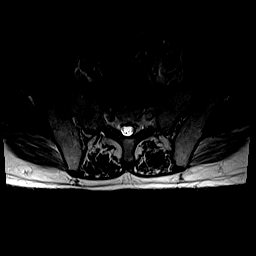
[im 6/33]
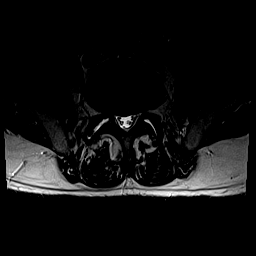
[im 11/33]
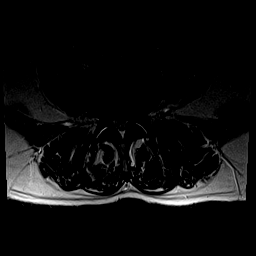
[im 14/33]
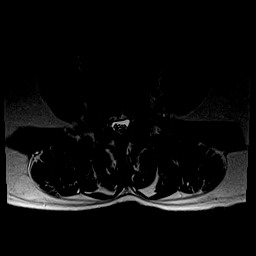
[im 17/33]
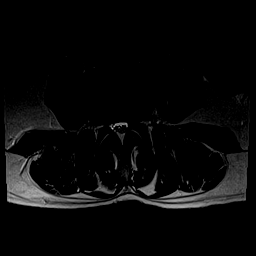
[im 19/33]
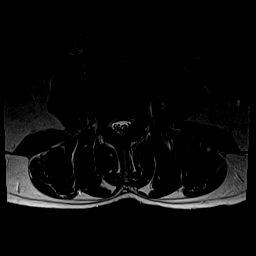
[im 22/33]
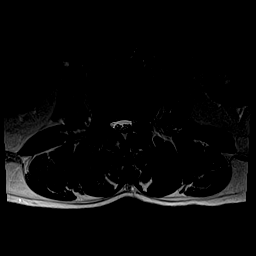
[im 27/33]
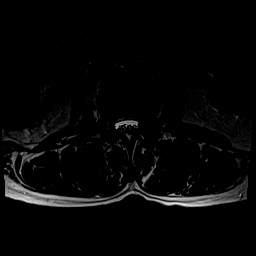
[im 33/33]
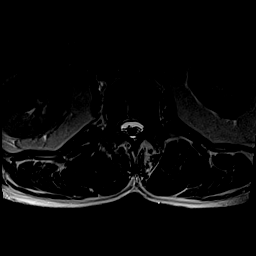

[Series 7: T1 · axial · 4.0mm · 0.31mm/px · z∈[+20,+155]mm · 3 of 34 slices shown (2 of 2)]
[im 6/34]
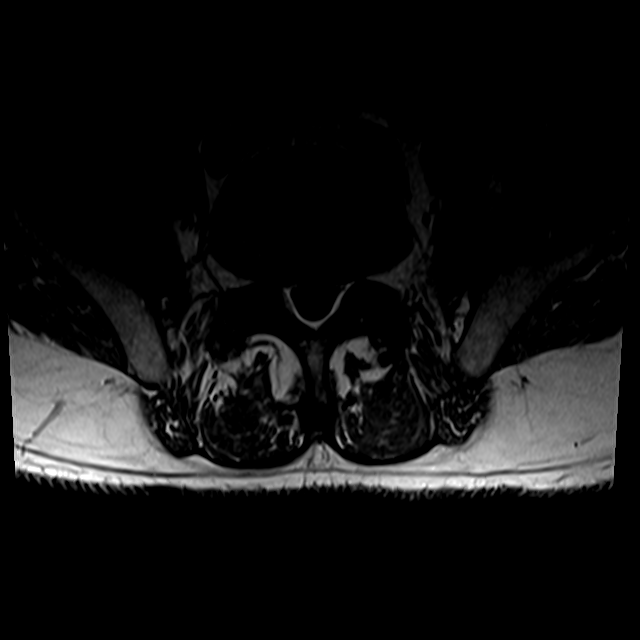
[im 18/34]
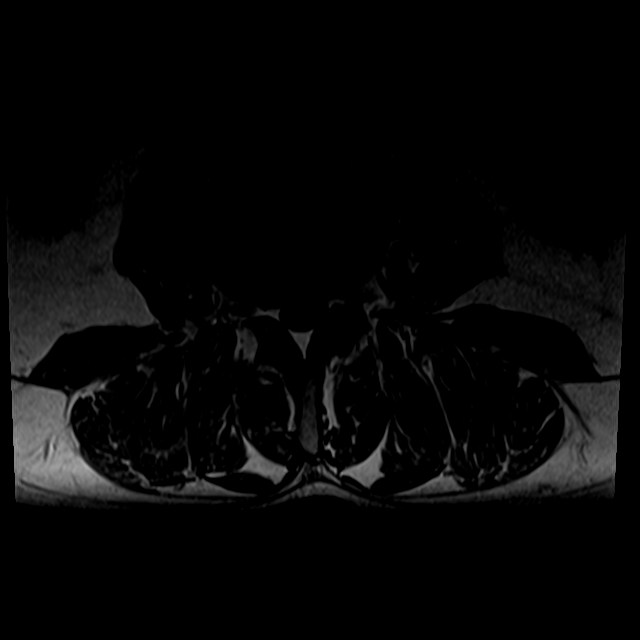
[im 28/34]
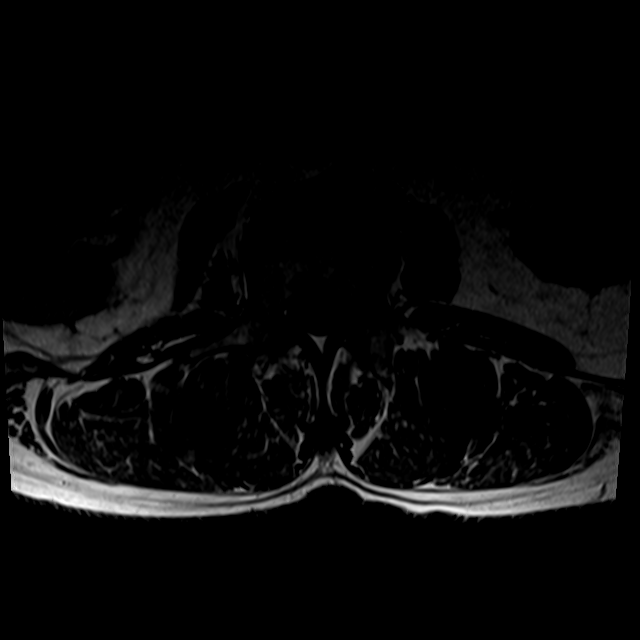

[25 of 48 positions shown; findings below may reference images not displayed]

FINDINGS: Segmentation: Conventional anatomy assistant, with the last open
disc space designated L5-S1.

Alignment: The vertebral bodies of the lumbar spine are normal in
alignment.

Bones: The vertebral bodies of the lumbar spine are normal in size.
There is normal bone marrow signal demonstrated throughout the
vertebra. There are mild degenerative changes of bilateral SI
joints.

Conus medullaris: Extends to the L1 level and appears normal. The
nerve roots of the cauda equina and the filum terminale are normal.

Paraspinal and other soft tissues: There is no focal abnormality.
The imaged intra-abdominal contents are unremarkable.

Disc levels:

Disc spaces: Disc desiccation at L2-3 and L3-4.

T12-L1: No significant disc bulge. No evidence of neural foraminal
stenosis. No central canal stenosis.

L1-L2: No significant disc bulge. No evidence of neural foraminal
stenosis. No central canal stenosis.

L2-L3: Mild broad-based disc bulge. No evidence of neural foraminal
stenosis. No central canal stenosis.

L3-L4: Mild broad-based disc bulge. No evidence of neural foraminal
stenosis. No central canal stenosis.

L4-L5: Moderate broad-based disc bulge with a central disc
protrusion. Mild bilateral facet arthropathy. Moderate spinal
stenosis and bilateral lateral recess stenosis. Moderate left
foraminal stenosis.

L5-S1: Mild broad-based disc bulge. Severe bilateral facet
arthropathy. No evidence of neural foraminal stenosis. No central
canal stenosis.
IMPRESSION: 1. At L4-5 there is a moderate broad-based disc bulge with a central
disc protrusion. Mild bilateral facet arthropathy. Moderate spinal
stenosis and bilateral lateral recess stenosis. Moderate left
foraminal stenosis.

## 2017-09-27 ENCOUNTER — Ambulatory Visit: Payer: Medicare Other | Admitting: Surgery

## 2017-09-27 ENCOUNTER — Encounter: Payer: Self-pay | Admitting: Surgery

## 2017-09-27 VITALS — BP 144/73 | HR 79 | Temp 97.5°F | Resp 18 | Ht 65.0 in | Wt 165.0 lb

## 2017-09-27 DIAGNOSIS — N62 Hypertrophy of breast: Secondary | ICD-10-CM | POA: Diagnosis not present

## 2017-09-27 NOTE — Patient Instructions (Addendum)
Please call our office if you have questions or concerns.   Gynecomastia, Adult Gynecomastia is an overgrowth of gland tissue in a man's breasts. This may cause one or both breasts to become enlarged. This often develops in men who have an imbalance of the male sex hormone (testosterone) and the male sex hormone (estrogen). This means that a man may have too much estrogen, too little testosterone, or both. Gynecomastia may be a normal part of aging for some men. It can also happen to adolescent boys during puberty. What are the causes? Gynecomastia may be caused by:  Certain medicines, such as: ? Estrogen supplements and medicines that act like estrogen in the body. ? Medicines that keep testosterone from functioning normally in the body (testosterone-inhibiting drugs). ? Anabolic steroids. ? Medicines to treat heartburn, cancer, heart disease, mental health problems, HIV (human immunodeficiency virus) or AIDS (acquired immunodeficiency syndrome). ? Antibiotic medicine. ? Chemotherapy medicine.  Recreational drugs, including alcohol, marijuana, and opioids.  Herbal products, including lavender and tea tree oil.  A gene that is passed along from parent to child (inherited).  Tumors in the pituitary or adrenal gland.  An overactive thyroid gland.  Certain inherited disorders, including a genetic disease that causes low testosterone in males (Klinefelter syndrome).  Cancer of the lung, kidney, liver, testicle, or gastrointestinal tract.  Conditions that cause liver or kidney failure.  Poor nutrition and starvation.  Testicle shrinking or failure (testicularatrophy).  In some cases, the cause may not be known. What increases the risk? You may have a higher risk for gynecomastia if you:  Are 45 years old or older.  Are overweight.  Abuse alcohol or other drugs.  Have a family history of gynecomastia.  What are the signs or symptoms?  Most of the time, breast enlargement  is the only symptom. The enlargement may start near the nipple, and the breast tissue may feel firm and rubbery. The breast may feel itchy, painful or tender. How is this diagnosed? This condition may be diagnosed based on:  Your symptoms.  Your medical history.  A physical exam.  Imaging tests, such as: ? An ultrasound. ? A mammogram. ? An MRI.  Blood tests.  Removal of a sample of breast tissue to be tested in a lab (biopsy).  How is this treated? Gynecomastia may go away on its own, without treatment. If gynecomastia is caused by a medical problem or drug abuse, treatment may include:  Getting treatment for the underlying medical problem or for drug abuse.  Changing or stopping medicines.  Medicines to block the effects of estrogen.  Taking a testosterone replacement.  Surgery to remove breast tissue or any lumps in your breasts.  Breast reduction surgery. This may be a possibility if you have severe or painful gynecomastia.  Follow these instructions at home:  Take over-the-counter and prescription medicines only as told by your health care provider.  Talk to your health care provider before taking any herbal medicines or diet supplements.  Do not abuse drugs or alcohol.  Keep all follow-up visits as told by your health care provider. This is important. Contact a health care provider if:  Your breast tissue grows larger or gets more swollen or painful.  You have a lump in your testicle.  You have blood or discharge coming from your nipples.  Your nipple changes shape.  You develop a hard or painful lump in your breast. This information is not intended to replace advice given to you by your health  care provider. Make sure you discuss any questions you have with your health care provider. Document Released: 02/22/2015 Document Revised: 06/07/2015 Document Reviewed: 02/22/2015 Elsevier Interactive Patient Education  [employer].
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
## 2017-11-12 ENCOUNTER — Ambulatory Visit: Payer: Medicare Other | Admitting: Family Medicine

## 2017-11-23 ENCOUNTER — Other Ambulatory Visit: Payer: Self-pay

## 2017-11-23 NOTE — Patient Outreach (Signed)
[place] [facility]) Care Management  11/23/2017  Montrel Donahoe. 18-Jan-1945 [id number]   Medication Adherence call to Mr. Lukasz Rogus left a message with wife he is due on Rosuvastatin 5 mg. Mr. Kehoe is showing past due under [place].   [place] Management Direct Dial [phone]  Fax [phone] [email or web address]

## 2017-11-24 ENCOUNTER — Other Ambulatory Visit: Payer: Self-pay | Admitting: Family Medicine

## 2017-11-24 DIAGNOSIS — G8928 Other chronic postprocedural pain: Secondary | ICD-10-CM

## 2017-11-25 NOTE — Telephone Encounter (Signed)
Patient has an appt for Monday Rx approved

## 2017-11-29 ENCOUNTER — Encounter: Payer: Self-pay | Admitting: Family Medicine

## 2017-11-29 ENCOUNTER — Ambulatory Visit (INDEPENDENT_AMBULATORY_CARE_PROVIDER_SITE_OTHER): Payer: Medicare Other | Admitting: Family Medicine

## 2017-11-29 VITALS — BP 102/64 | HR 70 | Temp 98.4°F | Ht 65.0 in | Wt 162.2 lb

## 2017-11-29 DIAGNOSIS — E1165 Type 2 diabetes mellitus with hyperglycemia: Secondary | ICD-10-CM | POA: Diagnosis not present

## 2017-11-29 DIAGNOSIS — R0681 Apnea, not elsewhere classified: Secondary | ICD-10-CM | POA: Diagnosis not present

## 2017-11-29 DIAGNOSIS — J320 Chronic maxillary sinusitis: Secondary | ICD-10-CM

## 2017-11-29 DIAGNOSIS — I10 Essential (primary) hypertension: Secondary | ICD-10-CM | POA: Diagnosis not present

## 2017-11-29 DIAGNOSIS — Z5181 Encounter for therapeutic drug level monitoring: Secondary | ICD-10-CM | POA: Diagnosis not present

## 2017-11-29 DIAGNOSIS — R0683 Snoring: Secondary | ICD-10-CM

## 2017-11-29 DIAGNOSIS — E7800 Pure hypercholesterolemia, unspecified: Secondary | ICD-10-CM

## 2017-11-29 DIAGNOSIS — R351 Nocturia: Secondary | ICD-10-CM | POA: Diagnosis not present

## 2017-11-29 DIAGNOSIS — IMO0001 Reserved for inherently not codable concepts without codable children: Secondary | ICD-10-CM

## 2017-11-29 MED ORDER — AMOXICILLIN-POT CLAVULANATE 875-125 MG PO TABS: 1.0000 | ORAL_TABLET | Freq: Two times a day (BID) | ORAL | 0 refills | Status: AC
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
## 2017-11-29 NOTE — Assessment & Plan Note (Signed)
Foot exam today; check labs

## 2017-11-29 NOTE — Assessment & Plan Note (Signed)
Excellent control; continue same; asx

## 2017-11-29 NOTE — Assessment & Plan Note (Signed)
Limit saturated fats; continue rosuvastatin; check lpids

## 2017-11-29 NOTE — Patient Instructions (Signed)
Start the antibiotics Please do eat yogurt or kimchi or take a probiotic daily for the next month We want to replace the healthy germs in the gut If you notice foul, watery diarrhea in the next two months, schedule an appointment RIGHT AWAY or go to an urgent care or the emergency room if a holiday or over a weekend  We'll have you see a specialist to find out if you have sleep apnea

## 2017-11-30 ENCOUNTER — Other Ambulatory Visit: Payer: Self-pay

## 2017-11-30 LAB — COMPLETE METABOLIC PANEL WITH GFR
AG RATIO: 1.8 (calc) (ref 1.0–2.5)
ALT: 23 U/L (ref 9–46)
AST: 14 U/L (ref 10–35)
Albumin: 4.6 g/dL (ref 3.6–5.1)
Alkaline phosphatase (APISO): 55 U/L (ref 40–115)
BUN: 16 mg/dL (ref 7–25)
CALCIUM: 9.4 mg/dL (ref 8.6–10.3)
CO2: 27 mmol/L (ref 20–32)
Chloride: 105 mmol/L (ref 98–110)
Creat: 0.83 mg/dL (ref 0.70–1.18)
GFR, EST AFRICAN AMERICAN: 102 mL/min/{1.73_m2} (ref >=60)
GFR, EST NON AFRICAN AMERICAN: 88 mL/min/{1.73_m2} (ref >=60)
GLOBULIN: 2.6 g/dL (ref 1.9–3.7)
Glucose, Bld: 119 mg/dL — ABNORMAL HIGH (ref 65–99)
POTASSIUM: 3.7 mmol/L (ref 3.5–5.3)
Sodium: 140 mmol/L (ref 135–146)
Total Bilirubin: 0.6 mg/dL (ref 0.2–1.2)
Total Protein: 7.2 g/dL (ref 6.1–8.1)

## 2017-11-30 LAB — HEMOGLOBIN A1C
Hgb A1c MFr Bld: 6.7 % of total Hgb — ABNORMAL HIGH (ref <5.7)
Mean Plasma Glucose: 146 (calc)
eAG (mmol/L): 8.1 (calc)

## 2017-11-30 LAB — LIPID PANEL
CHOL/HDL RATIO: 2.1 (calc) (ref <5.0)
Cholesterol: 143 mg/dL (ref <200)
HDL: 68 mg/dL (ref >40)
LDL Cholesterol (Calc): 58 mg/dL (calc)
NON-HDL CHOLESTEROL (CALC): 75 mg/dL (ref <130)
TRIGLYCERIDES: 91 mg/dL (ref <150)

## 2017-12-27 ENCOUNTER — Institutional Professional Consult (permissible substitution): Payer: Medicare Other | Admitting: Internal Medicine

## 2017-12-28 ENCOUNTER — Ambulatory Visit: Payer: Medicare Other | Admitting: Internal Medicine

## 2017-12-28 ENCOUNTER — Encounter: Payer: Self-pay | Admitting: Internal Medicine

## 2017-12-28 VITALS — BP 138/84 | HR 83 | Resp 16 | Ht 65.0 in | Wt 163.0 lb

## 2017-12-28 DIAGNOSIS — G4719 Other hypersomnia: Secondary | ICD-10-CM

## 2017-12-28 NOTE — Patient Instructions (Signed)
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
## 2018-01-13 ENCOUNTER — Ambulatory Visit: Payer: Medicare Other | Attending: Internal Medicine

## 2018-01-13 DIAGNOSIS — G4719 Other hypersomnia: Secondary | ICD-10-CM | POA: Diagnosis present

## 2018-01-20 DIAGNOSIS — G4733 Obstructive sleep apnea (adult) (pediatric): Secondary | ICD-10-CM | POA: Diagnosis not present

## 2018-01-24 ENCOUNTER — Telehealth: Payer: Self-pay | Admitting: *Deleted

## 2018-01-24 NOTE — Telephone Encounter (Signed)
Called patient to explain results of sleep study. Notified that he does have OSA. Explained that he does stop breathing during sleep. Pt does not want to start therapy at this time.

## 2018-01-26 ENCOUNTER — Other Ambulatory Visit: Payer: Self-pay | Admitting: Family Medicine

## 2018-01-26 DIAGNOSIS — E119 Type 2 diabetes mellitus without complications: Secondary | ICD-10-CM

## 2018-01-26 DIAGNOSIS — N401 Enlarged prostate with lower urinary tract symptoms: Secondary | ICD-10-CM

## 2018-01-26 DIAGNOSIS — R351 Nocturia: Secondary | ICD-10-CM

## 2018-01-27 NOTE — Telephone Encounter (Signed)
Lab Results  Component Value Date   CREATININE 0.83 11/29/2017   Lab Results  Component Value Date   K 3.7 11/29/2017   Lab Results  Component Value Date   HGBA1C 6.7 (H) 11/29/2017

## 2018-02-16 ENCOUNTER — Telehealth: Payer: Self-pay | Admitting: Family Medicine

## 2018-02-16 NOTE — Telephone Encounter (Signed)
I spoke with Aaron Calhoun and asked if the patient could come in earlier at 2:10pm on 02/24/2018 so that he can have his AWV with nurse and CPE with Dr. Sanda Klein on the same day.  She agreed. VDM (DD)

## 2018-02-22 ENCOUNTER — Ambulatory Visit: Payer: Medicare Other

## 2018-02-24 ENCOUNTER — Ambulatory Visit (INDEPENDENT_AMBULATORY_CARE_PROVIDER_SITE_OTHER): Payer: Medicare Other

## 2018-02-24 ENCOUNTER — Encounter: Payer: Medicare Other | Admitting: Family Medicine

## 2018-02-24 VITALS — BP 118/76 | HR 83 | Temp 98.0°F | Resp 16 | Ht 65.0 in | Wt 167.0 lb

## 2018-02-24 DIAGNOSIS — Z0000 Encounter for general adult medical examination without abnormal findings: Secondary | ICD-10-CM

## 2018-02-24 NOTE — Patient Instructions (Signed)
Aaron Calhoun , Thank you for taking time to come for your Medicare Wellness Visit. I appreciate your ongoing commitment to your health goals. Please review the following plan we discussed and let me know if I can assist you in the future.   Screening recommendations/referrals: Colonoscopy: done 05/07/09 Repeat in 2021 Recommended yearly ophthalmology/optometry visit for glaucoma screening and checkup Recommended yearly dental visit for hygiene and checkup  Vaccinations: Influenza vaccine: done 10/07/17 Pneumococcal vaccine: done 02/19/17 Tdap vaccine: done 2011 Shingles vaccine: Shingrix discussed. Please contact your pharmacy for coverage information.     Advanced directives: Please bring a copy of your health care power of attorney and living will to the office at your convenience once you have completed these documents.   Conditions/risks identified: Recommend healthy eating and physical activity to lower fasting blood sugar.   Next appointment: Please follow up in one year for your Medicare Annual Wellness visit.    Preventive Care 74 Years and Older, Male Preventive care refers to lifestyle choices and visits with your health care provider that can promote health and wellness. What does preventive care include?  A yearly physical exam. This is also called an annual well check.  Dental exams once or twice a year.  Routine eye exams. Ask your health care provider how often you should have your eyes checked.  Personal lifestyle choices, including:  Daily care of your teeth and gums.  Regular physical activity.  Eating a healthy diet.  Avoiding tobacco and drug use.  Limiting alcohol use.  Practicing safe sex.  Taking low doses of aspirin every day.  Taking vitamin and mineral supplements as recommended by your health care provider. What happens during an annual well check? The services and screenings done by your health care provider during your annual well check will  depend on your age, overall health, lifestyle risk factors, and family history of disease. Counseling  Your health care provider may ask you questions about your:  Alcohol use.  Tobacco use.  Drug use.  Emotional well-being.  Home and relationship well-being.  Sexual activity.  Eating habits.  History of falls.  Memory and ability to understand (cognition).  Work and work Statistician. Screening  You may have the following tests or measurements:  Height, weight, and BMI.  Blood pressure.  Lipid and cholesterol levels. These may be checked every 5 years, or more frequently if you are over 74 years old.  Skin check.  Lung cancer screening. You may have this screening every year starting at age 74 if you have a 30-pack-year history of smoking and currently smoke or have quit within the past 15 years.  Fecal occult blood test (FOBT) of the stool. You may have this test every year starting at age 74.  Flexible sigmoidoscopy or colonoscopy. You may have a sigmoidoscopy every 5 years or a colonoscopy every 10 years starting at age 74.  Prostate cancer screening. Recommendations will vary depending on your family history and other risks.  Hepatitis C blood test.  Hepatitis B blood test.  Sexually transmitted disease (STD) testing.  Diabetes screening. This is done by checking your blood sugar (glucose) after you have not eaten for a while (fasting). You may have this done every 1-3 years.  Abdominal aortic aneurysm (AAA) screening. You may need this if you are a current or former smoker.  Osteoporosis. You may be screened starting at age 66 if you are at high risk. Talk with your health care provider about your test results,  treatment options, and if necessary, the need for more tests. Vaccines  Your health care provider may recommend certain vaccines, such as:  Influenza vaccine. This is recommended every year.  Tetanus, diphtheria, and acellular pertussis (Tdap,  Td) vaccine. You may need a Td booster every 10 years.  Zoster vaccine. You may need this after age 74.  Pneumococcal 13-valent conjugate (PCV13) vaccine. One dose is recommended after age 74.  Pneumococcal polysaccharide (PPSV23) vaccine. One dose is recommended after age 74. Talk to your health care provider about which screenings and vaccines you need and how often you need them. This information is not intended to replace advice given to you by your health care provider. Make sure you discuss any questions you have with your health care provider. Document Released: 01/25/2015 Document Revised: 09/18/2015 Document Reviewed: 10/30/2014 Elsevier Interactive Patient Education  2017 [place] Prevention in the Home Falls can cause injuries. They can happen to people of all ages. There are many things you can do to make your home safe and to help prevent falls. What can I do on the outside of my home?  Regularly fix the edges of walkways and driveways and fix any cracks.  Remove anything that might make you trip as you walk through a door, such as a raised step or threshold.  Trim any bushes or trees on the path to your home.  Use bright outdoor lighting.  Clear any walking paths of anything that might make someone trip, such as rocks or tools.  Regularly check to see if handrails are loose or broken. Make sure that both sides of any steps have handrails.  Any raised decks and porches should have guardrails on the edges.  Have any leaves, snow, or ice cleared regularly.  Use sand or salt on walking paths during winter.  Clean up any spills in your garage right away. This includes oil or grease spills. What can I do in the bathroom?  Use night lights.  Install grab bars by the toilet and in the tub and shower. Do not use towel bars as grab bars.  Use non-skid mats or decals in the tub or shower.  If you need to sit down in the shower, use a plastic, non-slip  stool.  Keep the floor dry. Clean up any water that spills on the floor as soon as it happens.  Remove soap buildup in the tub or shower regularly.  Attach bath mats securely with double-sided non-slip rug tape.  Do not have throw rugs and other things on the floor that can make you trip. What can I do in the bedroom?  Use night lights.  Make sure that you have a light by your bed that is easy to reach.  Do not use any sheets or blankets that are too big for your bed. They should not hang down onto the floor.  Have a firm chair that has side arms. You can use this for support while you get dressed.  Do not have throw rugs and other things on the floor that can make you trip. What can I do in the kitchen?  Clean up any spills right away.  Avoid walking on wet floors.  Keep items that you use a lot in easy-to-reach places.  If you need to reach something above you, use a strong step stool that has a grab bar.  Keep electrical cords out of the way.  Do not use floor polish or wax that makes floors  slippery. If you must use wax, use non-skid floor wax.  Do not have throw rugs and other things on the floor that can make you trip. What can I do with my stairs?  Do not leave any items on the stairs.  Make sure that there are handrails on both sides of the stairs and use them. Fix handrails that are broken or loose. Make sure that handrails are as long as the stairways.  Check any carpeting to make sure that it is firmly attached to the stairs. Fix any carpet that is loose or worn.  Avoid having throw rugs at the top or bottom of the stairs. If you do have throw rugs, attach them to the floor with carpet tape.  Make sure that you have a light switch at the top of the stairs and the bottom of the stairs. If you do not have them, ask someone to add them for you. What else can I do to help prevent falls?  Wear shoes that:  Do not have high heels.  Have rubber bottoms.  Are  comfortable and fit you well.  Are closed at the toe. Do not wear sandals.  If you use a stepladder:  Make sure that it is fully opened. Do not climb a closed stepladder.  Make sure that both sides of the stepladder are locked into place.  Ask someone to hold it for you, if possible.  Clearly mark and make sure that you can see:  Any grab bars or handrails.  First and last steps.  Where the edge of each step is.  Use tools that help you move around (mobility aids) if they are needed. These include:  Canes.  Walkers.  Scooters.  Crutches.  Turn on the lights when you go into a dark area. Replace any light bulbs as soon as they burn out.  Set up your furniture so you have a clear path. Avoid moving your furniture around.  If any of your floors are uneven, fix them.  If there are any pets around you, be aware of where they are.  Review your medicines with your doctor. Some medicines can make you feel dizzy. This can increase your chance of falling. Ask your doctor what other things that you can do to help prevent falls. This information is not intended to replace advice given to you by your health care provider. Make sure you discuss any questions you have with your health care provider. Document Released: 10/25/2008 Document Revised: 06/06/2015 Document Reviewed: 02/02/2014 Elsevier Interactive Patient Education  2017 [employer].
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
## 2018-03-10 ENCOUNTER — Other Ambulatory Visit: Payer: Self-pay

## 2018-03-10 ENCOUNTER — Encounter: Payer: Self-pay | Admitting: Family Medicine

## 2018-03-10 ENCOUNTER — Ambulatory Visit: Payer: Medicare Other

## 2018-03-10 ENCOUNTER — Ambulatory Visit (INDEPENDENT_AMBULATORY_CARE_PROVIDER_SITE_OTHER): Payer: Medicare Other | Admitting: Family Medicine

## 2018-03-10 VITALS — BP 122/74 | HR 71 | Temp 97.5°F | Ht 65.0 in | Wt 165.6 lb

## 2018-03-10 DIAGNOSIS — Z1211 Encounter for screening for malignant neoplasm of colon: Secondary | ICD-10-CM | POA: Diagnosis not present

## 2018-03-10 DIAGNOSIS — G8928 Other chronic postprocedural pain: Secondary | ICD-10-CM

## 2018-03-10 DIAGNOSIS — E785 Hyperlipidemia, unspecified: Secondary | ICD-10-CM | POA: Diagnosis not present

## 2018-03-10 DIAGNOSIS — I10 Essential (primary) hypertension: Secondary | ICD-10-CM | POA: Diagnosis not present

## 2018-03-10 DIAGNOSIS — Z800 Family history of malignant neoplasm of digestive organs: Secondary | ICD-10-CM

## 2018-03-10 DIAGNOSIS — E1140 Type 2 diabetes mellitus with diabetic neuropathy, unspecified: Secondary | ICD-10-CM

## 2018-03-10 MED ORDER — EMPAGLIFLOZIN 25 MG PO TABS: 25.0000 mg | ORAL_TABLET | Freq: Every day | ORAL | 1 refills | Status: DC

## 2018-03-10 MED ORDER — GABAPENTIN 300 MG PO CAPS: 300.0000 mg | ORAL_CAPSULE | Freq: Three times a day (TID) | ORAL | 1 refills | Status: DC

## 2018-03-10 MED ORDER — SILDENAFIL CITRATE 100 MG PO TABS: 50.0000 mg | ORAL_TABLET | Freq: Every day | ORAL | 5 refills | Status: DC | PRN

## 2018-03-10 MED ORDER — ROSUVASTATIN CALCIUM 5 MG PO TABS: 5.0000 mg | ORAL_TABLET | Freq: Every day | ORAL | 1 refills | Status: DC

## 2018-03-10 MED ORDER — OLMESARTAN MEDOXOMIL-HCTZ 40-25 MG PO TABS: 1.0000 | ORAL_TABLET | Freq: Every day | ORAL | 1 refills | Status: DC

## 2018-03-10 NOTE — Patient Instructions (Addendum)
Try to lose five pounds in the next 3 months Reduce portions and snacks Try to follow the DASH guidelines (DASH stands for Dietary Approaches to Stop Hypertension). Try to limit the sodium in your diet to no more than 1,500mg  of sodium per day. Certainly try to not exceed 2,000 mg per day at the very most. Do not add salt when cooking or at the table.  Check the sodium amount on labels when shopping, and choose items lower in sodium when given a choice. Avoid or limit foods that already contain a lot of sodium. Eat a diet rich in fruits and vegetables and whole grains, and try to lose weight if overweight or obese Try to limit saturated fats in your diet (bologna, hot dogs, barbeque, cheeseburgers, hamburgers, steak, bacon, sausage, cheese, etc.) and get more fresh fruits, vegetables, and whole grains   DASH Eating Plan DASH stands for "Dietary Approaches to Stop Hypertension." The DASH eating plan is a healthy eating plan that has been shown to reduce high blood pressure (hypertension). It may also reduce your risk for type 2 diabetes, heart disease, and stroke. The DASH eating plan may also help with weight loss. What are tips for following this plan?  General guidelines  Avoid eating more than 2,300 mg (milligrams) of salt (sodium) a day. If you have hypertension, you may need to reduce your sodium intake to 1,500 mg a day.  Limit alcohol intake to no more than 1 drink a day for nonpregnant women and 2 drinks a day for men. One drink equals 12 oz of beer, 5 oz of wine, or 1 oz of hard liquor.  Work with your health care provider to maintain a healthy body weight or to lose weight. Ask what an ideal weight is for you.  Get at least 30 minutes of exercise that causes your heart to beat faster (aerobic exercise) most days of the week. Activities may include walking, swimming, or biking.  Work with your health care provider or diet and nutrition specialist (dietitian) to adjust your eating plan  to your individual calorie needs. Reading food labels   Check food labels for the amount of sodium per serving. Choose foods with less than 5 percent of the Daily Value of sodium. Generally, foods with less than 300 mg of sodium per serving fit into this eating plan.  To find whole grains, look for the word "whole" as the first word in the ingredient list. Shopping  Buy products labeled as "low-sodium" or "no salt added."  Buy fresh foods. Avoid canned foods and premade or frozen meals. Cooking  Avoid adding salt when cooking. Use salt-free seasonings or herbs instead of table salt or sea salt. Check with your health care provider or pharmacist before using salt substitutes.  Do not fry foods. Cook foods using healthy methods such as baking, boiling, grilling, and broiling instead.  Cook with heart-healthy oils, such as olive, canola, soybean, or sunflower oil. Meal planning  Eat a balanced diet that includes: ? 5 or more servings of fruits and vegetables each day. At each meal, try to fill half of your plate with fruits and vegetables. ? Up to 6-8 servings of whole grains each day. ? Less than 6 oz of lean meat, poultry, or fish each day. A 3-oz serving of meat is about the same size as a deck of cards. One egg equals 1 oz. ? 2 servings of low-fat dairy each day. ? A serving of nuts, seeds, or beans 5  times each week. ? Heart-healthy fats. Healthy fats called Omega-3 fatty acids are found in foods such as flaxseeds and coldwater fish, like sardines, salmon, and mackerel.  Limit how much you eat of the following: ? Canned or prepackaged foods. ? Food that is high in trans fat, such as fried foods. ? Food that is high in saturated fat, such as fatty meat. ? Sweets, desserts, sugary drinks, and other foods with added sugar. ? Full-fat dairy products.  Do not salt foods before eating.  Try to eat at least 2 vegetarian meals each week.  Eat more home-cooked food and less  restaurant, buffet, and fast food.  When eating at a restaurant, ask that your food be prepared with less salt or no salt, if possible. What foods are recommended? The items listed may not be a complete list. Talk with your dietitian about what dietary choices are best for you. Grains Whole-grain or whole-wheat bread. Whole-grain or whole-wheat pasta. Brown rice. [place]. Bulgur. Whole-grain and low-sodium cereals. Pita bread. Low-fat, low-sodium crackers. Whole-wheat flour tortillas. Vegetables Fresh or frozen vegetables (raw, steamed, roasted, or grilled). Low-sodium or reduced-sodium tomato and vegetable juice. Low-sodium or reduced-sodium tomato sauce and tomato paste. Low-sodium or reduced-sodium canned vegetables. Fruits All fresh, dried, or frozen fruit. Canned fruit in natural juice (without added sugar). Meat and other protein foods Skinless chicken or [place]. Ground chicken or [place]. Pork with fat trimmed off. Fish and seafood. Egg whites. Dried beans, peas, or lentils. Unsalted nuts, nut butters, and seeds. Unsalted canned beans. Lean cuts of beef with fat trimmed off. Low-sodium, lean deli meat. Dairy Low-fat (1%) or fat-free (skim) milk. Fat-free, low-fat, or reduced-fat cheeses. Nonfat, low-sodium ricotta or cottage cheese. Low-fat or nonfat yogurt. Low-fat, low-sodium cheese. Fats and oils Soft margarine without trans fats. Vegetable oil. Low-fat, reduced-fat, or light mayonnaise and salad dressings (reduced-sodium). Canola, safflower, olive, soybean, and sunflower oils. Avocado. Seasoning and other foods Herbs. Spices. Seasoning mixes without salt. Unsalted popcorn and pretzels. Fat-free sweets. What foods are not recommended? The items listed may not be a complete list. Talk with your dietitian about what dietary choices are best for you. Grains Baked goods made with fat, such as croissants, muffins, or some breads. Dry pasta or rice meal packs. Vegetables Creamed or  fried vegetables. Vegetables in a cheese sauce. Regular canned vegetables (not low-sodium or reduced-sodium). Regular canned tomato sauce and paste (not low-sodium or reduced-sodium). Regular tomato and vegetable juice (not low-sodium or reduced-sodium). Angie Fava. Olives. Fruits Canned fruit in a light or heavy syrup. Fried fruit. Fruit in cream or butter sauce. Meat and other protein foods Fatty cuts of meat. Ribs. Fried meat. Berniece Salines. Sausage. Bologna and other processed lunch meats. Salami. Fatback. Hotdogs. Bratwurst. Salted nuts and seeds. Canned beans with added salt. Canned or smoked fish. Whole eggs or egg yolks. Chicken or [place] with skin. Dairy Whole or 2% milk, cream, and half-and-half. Whole or full-fat cream cheese. Whole-fat or sweetened yogurt. Full-fat cheese. Nondairy creamers. Whipped toppings. Processed cheese and cheese spreads. Fats and oils Butter. Stick margarine. Lard. Shortening. Ghee. Bacon fat. Tropical oils, such as coconut, palm kernel, or palm oil. Seasoning and other foods Salted popcorn and pretzels. Onion salt, garlic salt, seasoned salt, table salt, and sea salt. Worcestershire sauce. Tartar sauce. Barbecue sauce. Teriyaki sauce. Soy sauce, including reduced-sodium. Steak sauce. Canned and packaged gravies. Fish sauce. Oyster sauce. Cocktail sauce. Horseradish that you find on the shelf. Ketchup. Mustard. Meat flavorings and tenderizers. Bouillon cubes. Hot  sauce and Tabasco sauce. Premade or packaged marinades. Premade or packaged taco seasonings. Relishes. Regular salad dressings. Where to find more information:  National Heart, Lung, and [place]: https://wilson-eaton.com/  American Heart Association: www.heart.org Summary  The DASH eating plan is a healthy eating plan that has been shown to reduce high blood pressure (hypertension). It may also reduce your risk for type 2 diabetes, heart disease, and stroke.  With the DASH eating plan, you should limit salt  (sodium) intake to 2,300 mg a day. If you have hypertension, you may need to reduce your sodium intake to 1,500 mg a day.  When on the DASH eating plan, aim to eat more fresh fruits and vegetables, whole grains, lean proteins, low-fat dairy, and heart-healthy fats.  Work with your health care provider or diet and nutrition specialist (dietitian) to adjust your eating plan to your individual calorie needs. This information is not intended to replace advice given to you by your health care provider. Make sure you discuss any questions you have with your health care provider. Document Released: 12/18/2010 Document Revised: 12/23/2015 Document Reviewed: 12/23/2015 Elsevier Interactive Patient Education  2019 [employer].
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
## 2018-03-10 NOTE — Assessment & Plan Note (Signed)
Continue medicines; refills sent; limit salt; DASH guidelines

## 2018-03-10 NOTE — Assessment & Plan Note (Signed)
Continue gabapentin; foot exam by MD today; labs are UTD; eye exam yearly

## 2018-03-16 ENCOUNTER — Other Ambulatory Visit: Payer: Self-pay

## 2018-03-16 ENCOUNTER — Encounter: Payer: Self-pay | Admitting: *Deleted

## 2018-03-23 NOTE — Discharge Instructions (Signed)
General Anesthesia, Adult, Care After  This sheet gives you information about how to care for yourself after your procedure. Your health care provider may also give you more specific instructions. If you have problems or questions, contact your health care provider.  What can I expect after the procedure?  After the procedure, the following side effects are common:  Pain or discomfort at the IV site.  Nausea.  Vomiting.  Sore throat.  Trouble concentrating.  Feeling cold or chills.  Weak or tired.  Sleepiness and fatigue.  Soreness and body aches. These side effects can affect parts of the body that were not involved in surgery.  Follow these instructions at home:    For at least 24 hours after the procedure:  Have a responsible adult stay with you. It is important to have someone help care for you until you are awake and alert.  Rest as needed.  Do not:  Participate in activities in which you could fall or become injured.  Drive.  Use heavy machinery.  Drink alcohol.  Take sleeping pills or medicines that cause drowsiness.  Make important decisions or sign legal documents.  Take care of children on your own.  Eating and drinking  Follow any instructions from your health care provider about eating or drinking restrictions.  When you feel hungry, start by eating small amounts of foods that are soft and easy to digest (bland), such as toast. Gradually return to your regular diet.  Drink enough fluid to keep your urine pale yellow.  If you vomit, rehydrate by drinking water, juice, or clear broth.  General instructions  If you have sleep apnea, surgery and certain medicines can increase your risk for breathing problems. Follow instructions from your health care provider about wearing your sleep device:  Anytime you are sleeping, including during daytime naps.  While taking prescription pain medicines, sleeping medicines, or medicines that make you drowsy.  Return to your normal activities as told by your health care  provider. Ask your health care provider what activities are safe for you.  Take over-the-counter and prescription medicines only as told by your health care provider.  If you smoke, do not smoke without supervision.  Keep all follow-up visits as told by your health care provider. This is important.  Contact a health care provider if:  You have nausea or vomiting that does not get better with medicine.  You cannot eat or drink without vomiting.  You have pain that does not get better with medicine.  You are unable to pass urine.  You develop a skin rash.  You have a fever.  You have redness around your IV site that gets worse.  Get help right away if:  You have difficulty breathing.  You have chest pain.  You have blood in your urine or stool, or you vomit blood.  Summary  After the procedure, it is common to have a sore throat or nausea. It is also common to feel tired.  Have a responsible adult stay with you for the first 24 hours after general anesthesia. It is important to have someone help care for you until you are awake and alert.  When you feel hungry, start by eating small amounts of foods that are soft and easy to digest (bland), such as toast. Gradually return to your regular diet.  Drink enough fluid to keep your urine pale yellow.  Return to your normal activities as told by your health care provider. Ask your health care   provider what activities are safe for you.  This information is not intended to replace advice given to you by your health care provider. Make sure you discuss any questions you have with your health care provider.  Document Released: 04/06/2000 Document Revised: 08/14/2016 Document Reviewed: 08/14/2016  Elsevier Interactive Patient Education  2019 [employer]

## 2018-03-25 ENCOUNTER — Encounter
Admission: RE | Disposition: A | Discharge status: Home / Self Care | Payer: Self-pay | Source: Home / Self Care | Attending: Gastroenterology

## 2018-03-25 ENCOUNTER — Ambulatory Visit: Payer: Medicare Other | Admitting: Anesthesiology

## 2018-03-25 ENCOUNTER — Ambulatory Visit
Admission: RE | Admit: 2018-03-25 | Discharge: 2018-03-25 | Disposition: A | Discharge status: Home / Self Care | Payer: Medicare Other | Attending: Gastroenterology | Admitting: Gastroenterology

## 2018-03-25 DIAGNOSIS — D125 Benign neoplasm of sigmoid colon: Secondary | ICD-10-CM | POA: Diagnosis not present

## 2018-03-25 DIAGNOSIS — Z7982 Long term (current) use of aspirin: Secondary | ICD-10-CM | POA: Insufficient documentation

## 2018-03-25 DIAGNOSIS — I10 Essential (primary) hypertension: Secondary | ICD-10-CM | POA: Diagnosis not present

## 2018-03-25 DIAGNOSIS — Z8249 Family history of ischemic heart disease and other diseases of the circulatory system: Secondary | ICD-10-CM | POA: Insufficient documentation

## 2018-03-25 DIAGNOSIS — K641 Second degree hemorrhoids: Secondary | ICD-10-CM | POA: Diagnosis not present

## 2018-03-25 DIAGNOSIS — Z1211 Encounter for screening for malignant neoplasm of colon: Secondary | ICD-10-CM

## 2018-03-25 DIAGNOSIS — Z833 Family history of diabetes mellitus: Secondary | ICD-10-CM | POA: Insufficient documentation

## 2018-03-25 DIAGNOSIS — D122 Benign neoplasm of ascending colon: Secondary | ICD-10-CM

## 2018-03-25 DIAGNOSIS — Z87891 Personal history of nicotine dependence: Secondary | ICD-10-CM | POA: Diagnosis not present

## 2018-03-25 DIAGNOSIS — D124 Benign neoplasm of descending colon: Secondary | ICD-10-CM

## 2018-03-25 DIAGNOSIS — E785 Hyperlipidemia, unspecified: Secondary | ICD-10-CM | POA: Diagnosis not present

## 2018-03-25 DIAGNOSIS — E119 Type 2 diabetes mellitus without complications: Secondary | ICD-10-CM | POA: Diagnosis not present

## 2018-03-25 DIAGNOSIS — Z800 Family history of malignant neoplasm of digestive organs: Secondary | ICD-10-CM | POA: Insufficient documentation

## 2018-03-25 DIAGNOSIS — Z7984 Long term (current) use of oral hypoglycemic drugs: Secondary | ICD-10-CM | POA: Insufficient documentation

## 2018-03-25 DIAGNOSIS — K635 Polyp of colon: Secondary | ICD-10-CM

## 2018-03-25 DIAGNOSIS — Z79899 Other long term (current) drug therapy: Secondary | ICD-10-CM | POA: Diagnosis not present

## 2018-03-25 DIAGNOSIS — G4730 Sleep apnea, unspecified: Secondary | ICD-10-CM | POA: Diagnosis not present

## 2018-03-25 DIAGNOSIS — N400 Enlarged prostate without lower urinary tract symptoms: Secondary | ICD-10-CM | POA: Diagnosis not present

## 2018-03-25 HISTORY — DX: Sleep apnea, unspecified: G47.30

## 2018-03-25 HISTORY — PX: COLONOSCOPY WITH PROPOFOL: SHX5780

## 2018-03-25 HISTORY — PX: POLYPECTOMY: SHX5525

## 2018-03-25 LAB — GLUCOSE, CAPILLARY
Glucose-Capillary: 104 mg/dL — ABNORMAL HIGH (ref 70–99)
Glucose-Capillary: 109 mg/dL — ABNORMAL HIGH (ref 70–99)

## 2018-03-25 SURGERY — COLONOSCOPY WITH PROPOFOL
Anesthesia: General | Site: Rectum

## 2018-03-25 MED ORDER — LACTATED RINGERS IV SOLN
INTRAVENOUS | Status: DC
  Administered 2018-03-25: 08:00:00 via INTRAVENOUS

## 2018-03-25 MED ORDER — STERILE WATER FOR IRRIGATION IR SOLN
Status: DC | PRN
  Administered 2018-03-25: 09:00:00

## 2018-03-25 MED ORDER — LIDOCAINE HCL (CARDIAC) PF 100 MG/5ML IV SOSY
PREFILLED_SYRINGE | INTRAVENOUS | Status: DC | PRN
  Administered 2018-03-25: 30 mg via INTRAVENOUS

## 2018-03-25 MED ORDER — OXYCODONE HCL 5 MG/5ML PO SOLN: 5.0000 mg | Freq: Once | ORAL | Status: DC | PRN

## 2018-03-25 MED ORDER — SODIUM CHLORIDE 0.9 % IV SOLN: INTRAVENOUS | Status: DC

## 2018-03-25 MED ORDER — OXYCODONE HCL 5 MG PO TABS: 5.0000 mg | ORAL_TABLET | Freq: Once | ORAL | Status: DC | PRN

## 2018-03-25 MED ORDER — PROPOFOL 10 MG/ML IV BOLUS
INTRAVENOUS | Status: DC | PRN
  Administered 2018-03-25 (×3): 40 mg via INTRAVENOUS
  Administered 2018-03-25: 150 mg via INTRAVENOUS
  Administered 2018-03-25: 50 mg via INTRAVENOUS

## 2018-03-25 SURGICAL SUPPLY — 8 items
CANISTER SUCT 1200ML W/VALVE (MISCELLANEOUS) ×3 IMPLANT
FORCEPS BIOP RAD 4 LRG CAP 4 (CUTTING FORCEPS) ×3 IMPLANT
GOWN CVR UNV OPN BCK APRN NK (MISCELLANEOUS) ×2 IMPLANT
GOWN ISOL THUMB LOOP REG UNIV (MISCELLANEOUS) ×4
KIT ENDO PROCEDURE OLY (KITS) ×3 IMPLANT
SNARE SHORT THROW 13M SML OVAL (MISCELLANEOUS) ×3 IMPLANT
TRAP ETRAP POLY (MISCELLANEOUS) ×3 IMPLANT
WATER STERILE IRR 250ML POUR (IV SOLUTION) ×3 IMPLANT
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
## 2018-03-25 NOTE — Op Note (Signed)
[facility] Patient Name: Aaron Calhoun Procedure Date: 03/25/2018 8:13 AM MRN: [id number] Account #: [id number] Date of Birth: 10/31/1945 Admit Type: Outpatient Age: 73 Room: [facility] OR ROOM 01 Gender: Male Note Status: Finalized Procedure:            Colonoscopy Indications:          Screening for colorectal malignant neoplasm Providers:            Lucilla Lame MD, MD Referring MD:         Arnetha Courser (Referring MD) Medicines:            Propofol per Anesthesia Complications:        No immediate complications. Procedure:            Pre-Anesthesia Assessment:                       - Prior to the procedure, a History and Physical was                        performed, and patient medications and allergies were                        reviewed. The patient's tolerance of previous                        anesthesia was also reviewed. The risks and benefits of                        the procedure and the sedation options and risks were                        discussed with the patient. All questions were                        answered, and informed consent was obtained. Prior                        Anticoagulants: The patient has taken no previous                        anticoagulant or antiplatelet agents. ASA Grade                        Assessment: II - A patient with mild systemic disease.                        After reviewing the risks and benefits, the patient was                        deemed in satisfactory condition to undergo the                        procedure.                       After obtaining informed consent, the colonoscope was                        passed under direct vision. Throughout the procedure,  the patient's blood pressure, pulse, and oxygen                        saturations were monitored continuously. The was                        introduced through the anus and advanced to the the            cecum, identified by appendiceal orifice and ileocecal                        valve. The colonoscopy was performed without                        difficulty. The patient tolerated the procedure well.                        The quality of the bowel preparation was excellent. Findings:      The perianal and digital rectal examinations were normal.      Two sessile polyps were found in the ascending colon. The polyps were 5       to 7 mm in size. These polyps were removed with a cold snare. Resection       and retrieval were complete.      A 3 mm polyp was found in the descending colon. The polyp was sessile.       The polyp was removed with a cold biopsy forceps. Resection and       retrieval were complete.      A 3 mm polyp was found in the sigmoid colon. The polyp was sessile. The       polyp was removed with a cold biopsy forceps. Resection and retrieval       were complete.      Non-bleeding internal hemorrhoids were found during retroflexion. The       hemorrhoids were Grade II (internal hemorrhoids that prolapse but reduce       spontaneously). Impression:           - Two 5 to 7 mm polyps in the ascending colon, removed                        with a cold snare. Resected and retrieved.                       - One 3 mm polyp in the descending colon, removed with                        a cold biopsy forceps. Resected and retrieved.                       - One 3 mm polyp in the sigmoid colon, removed with a                        cold biopsy forceps. Resected and retrieved.                       - Non-bleeding internal hemorrhoids. Recommendation:       - Discharge patient to home.                       -  Resume previous diet.                       - Continue present medications.                       - Repeat colonoscopy in 5 years if polyp adenoma and 10                        years if hyperplastic Procedure Code(s):    --- Professional ---                       [phone],  Colonoscopy, flexible; with removal of tumor(s),                        polyp(s), or other lesion(s) by snare technique                       45380, 109, Colonoscopy, flexible; with biopsy, single                        or multiple Diagnosis Code(s):    --- Professional ---                       Z12.11, Encounter for screening for malignant neoplasm                        of colon                       D12.2, Benign neoplasm of ascending colon                       D12.4, Benign neoplasm of descending colon                       D12.5, Benign neoplasm of sigmoid colon CPT copyright 2018 American Medical Association. All rights reserved. The codes documented in this report are preliminary and upon coder review may  be revised to meet current compliance requirements. Lucilla Lame MD, MD 03/25/2018 8:47:28 AM This report has been signed electronically. Number of Addenda: 0 Note Initiated On: 03/25/2018 8:13 AM Scope Withdrawal Time: 0 hours 10 minutes 12 seconds  Total Procedure Duration: 0 hours 19 minutes 3 seconds       [facility]
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
## 2018-03-25 NOTE — Anesthesia Postprocedure Evaluation (Signed)
Anesthesia Post Note  Patient: Aaron Calhoun.  Procedure(s) Performed: COLONOSCOPY WITH BIOPSIES (N/A Rectum) POLYPECTOMY (N/A Rectum)  Patient location during evaluation: PACU Anesthesia Type: General Level of consciousness: awake and alert Pain management: pain level controlled Vital Signs Assessment: post-procedure vital signs reviewed and stable Respiratory status: spontaneous breathing, nonlabored ventilation, respiratory function stable and patient connected to nasal cannula oxygen Cardiovascular status: blood pressure returned to baseline and stable Postop Assessment: no apparent nausea or vomiting Anesthetic complications: no  In Pacu, monitor PACs improved from preop. VSS. Pt denies CP or SOB. Pt denies palpitations.    [name]

## 2018-03-25 NOTE — Transfer of Care (Signed)
Immediate Anesthesia Transfer of Care Note  Patient: Aaron Calhoun.  Procedure(s) Performed: COLONOSCOPY WITH BIOPSIES (N/A Rectum) POLYPECTOMY (N/A Rectum)  Patient Location: PACU  Anesthesia Type: General  Level of Consciousness: awake, alert  and patient cooperative  Airway and Oxygen Therapy: Patient Spontanous Breathing and Patient connected to supplemental oxygen  Post-op Assessment: Post-op Vital signs reviewed, Patient's Cardiovascular Status Stable, Respiratory Function Stable, Patent Airway and No signs of Nausea or vomiting  Post-op Vital Signs: Reviewed and stable  Complications: No apparent anesthesia complications

## 2018-03-25 NOTE — Anesthesia Procedure Notes (Signed)
Date/Time: 03/25/2018 8:20 AM Performed by: Cameron Ali, CRNA Pre-anesthesia Checklist: Patient identified, Emergency Drugs available, Suction available, Timeout performed and Patient being monitored Patient Re-evaluated:Patient Re-evaluated prior to induction Oxygen Delivery Method: Nasal cannula Placement Confirmation: positive ETCO2

## 2018-03-28 ENCOUNTER — Encounter: Payer: Self-pay | Admitting: Gastroenterology

## 2018-04-20 ENCOUNTER — Telehealth: Payer: Self-pay

## 2018-04-20 NOTE — Telephone Encounter (Signed)
Got message from [facility] they are out of stock of olmesartan/HCTZ

## 2018-04-21 MED ORDER — LOSARTAN POTASSIUM-HCTZ 100-25 MG PO TABS: 1.0000 | ORAL_TABLET | Freq: Every day | ORAL | 3 refills | Status: DC

## 2018-04-21 NOTE — Telephone Encounter (Signed)
Pt.notified

## 2018-04-21 NOTE — Telephone Encounter (Signed)
New Rx sent Please notify patient of the need for change per pharmacy request Keep taking olmesartan-hctz until he runs out and starts the new losartan-hctz Do not take both together After he gets the new medicine, ask him to check his BP and contact us 1-2 weeks after the switch We want to make sure the new medicine is working as well as his other medicine

## 2018-06-17 ENCOUNTER — Encounter: Payer: Self-pay | Admitting: Nurse Practitioner

## 2018-06-17 ENCOUNTER — Ambulatory Visit (INDEPENDENT_AMBULATORY_CARE_PROVIDER_SITE_OTHER): Payer: Medicare Other | Admitting: Nurse Practitioner

## 2018-06-17 ENCOUNTER — Ambulatory Visit: Payer: Medicare Other | Admitting: Family Medicine

## 2018-06-17 ENCOUNTER — Other Ambulatory Visit: Payer: Self-pay

## 2018-06-17 VITALS — BP 126/72 | HR 77 | Temp 98.0°F | Resp 12 | Ht 65.0 in | Wt 167.0 lb

## 2018-06-17 DIAGNOSIS — G8928 Other chronic postprocedural pain: Secondary | ICD-10-CM

## 2018-06-17 DIAGNOSIS — N401 Enlarged prostate with lower urinary tract symptoms: Secondary | ICD-10-CM | POA: Diagnosis not present

## 2018-06-17 DIAGNOSIS — M48061 Spinal stenosis, lumbar region without neurogenic claudication: Secondary | ICD-10-CM

## 2018-06-17 DIAGNOSIS — I10 Essential (primary) hypertension: Secondary | ICD-10-CM

## 2018-06-17 DIAGNOSIS — R351 Nocturia: Secondary | ICD-10-CM

## 2018-06-17 DIAGNOSIS — E1140 Type 2 diabetes mellitus with diabetic neuropathy, unspecified: Secondary | ICD-10-CM | POA: Diagnosis not present

## 2018-06-17 DIAGNOSIS — Z23 Encounter for immunization: Secondary | ICD-10-CM | POA: Diagnosis not present

## 2018-06-17 DIAGNOSIS — E7800 Pure hypercholesterolemia, unspecified: Secondary | ICD-10-CM | POA: Diagnosis not present

## 2018-06-17 DIAGNOSIS — Z5181 Encounter for therapeutic drug level monitoring: Secondary | ICD-10-CM

## 2018-06-17 MED ORDER — TAMSULOSIN HCL 0.4 MG PO CAPS: 0.8000 mg | ORAL_CAPSULE | Freq: Every day | ORAL | 1 refills | Status: DC

## 2018-06-17 MED ORDER — GABAPENTIN 300 MG PO CAPS: 300.0000 mg | ORAL_CAPSULE | Freq: Three times a day (TID) | ORAL | 1 refills | Status: DC

## 2018-06-17 MED ORDER — FINASTERIDE 5 MG PO TABS: 5.0000 mg | ORAL_TABLET | Freq: Every day | ORAL | 1 refills | Status: DC

## 2018-06-17 MED ORDER — GLUCOSE BLOOD VI STRP: ORAL_STRIP | 11 refills | Status: DC

## 2018-06-17 MED ORDER — METFORMIN HCL 1000 MG PO TABS: 500.0000 mg | ORAL_TABLET | Freq: Two times a day (BID) | ORAL | 1 refills | Status: DC
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
## 2018-06-17 NOTE — Patient Instructions (Signed)
Diabetes Mellitus and Standards of Medical Care Managing diabetes (diabetes mellitus) can be complicated. Your diabetes treatment may be managed by a team of health care providers, including:  A physician who specializes in diabetes (endocrinologist).  A nurse practitioner or physician assistant.  Nurses.  A diet and nutrition specialist (registered dietitian).  A certified diabetes educator (CDE).  An exercise specialist.  A pharmacist.  An eye doctor.  A foot specialist (podiatrist).  A dentist.  A primary care provider.  A mental health provider. Your health care providers follow guidelines to help you get the best quality of care. The following schedule is a general guideline for your diabetes management plan. Your health care providers may give you more specific instructions. Physical exams Upon being diagnosed with diabetes mellitus, and each year after that, your health care provider will ask about your medical and family history. He or she will also do a physical exam. Your exam may include:  Measuring your height, weight, and body mass index (BMI).  Checking your blood pressure. This will be done at every routine medical visit. Your target blood pressure may vary depending on your medical conditions, your age, and other factors.  Thyroid gland exam.  Skin exam.  Screening for damage to your nerves (peripheral neuropathy). This may include checking the pulse in your legs and feet and checking the level of sensation in your hands and feet.  A complete foot exam to inspect the structure and skin of your feet, including checking for cuts, bruises, redness, blisters, sores, or other problems.  Screening for blood vessel (vascular) problems, which may include checking the pulse in your legs and feet and checking your temperature. Blood tests Depending on your treatment plan and your personal needs, you may have the following tests done:  HbA1c (hemoglobin A1c). This  test provides information about blood sugar (glucose) control over the previous 2-3 months. It is used to adjust your treatment plan, if needed. This test will be done: ? At least 2 times a year, if you are meeting your treatment goals. ? 4 times a year, if you are not meeting your treatment goals or if treatment goals have changed.  Lipid testing, including total, LDL, and HDL cholesterol and triglyceride levels. ? The goal for LDL is less than 100 mg/dL (5.5 mmol/L). If you are at high risk for complications, the goal is less than 70 mg/dL (3.9 mmol/L). ? The goal for HDL is 40 mg/dL (2.2 mmol/L) or higher for men and 50 mg/dL (2.8 mmol/L) or higher for women. An HDL cholesterol of 60 mg/dL (3.3 mmol/L) or higher gives some protection against heart disease. ? The goal for triglycerides is less than 150 mg/dL (8.3 mmol/L).  Liver function tests.  Kidney function tests.  Thyroid function tests. Dental and eye exams  Visit your dentist two times a year.  If you have type 1 diabetes, your health care provider may recommend an eye exam 3-5 years after you are diagnosed, and then once a year after your first exam. ? For children with type 1 diabetes, a health care provider may recommend an eye exam when your child is age 10 or older and has had diabetes for 3-5 years. After the first exam, your child should get an eye exam once a year.  If you have type 2 diabetes, your health care provider may recommend an eye exam as soon as you are diagnosed, and then once a year after your first exam. Immunizations   The  your first exam.  Immunizations    The yearly flu (influenza) vaccine is recommended for everyone 6 months or older who has diabetes.  The pneumonia (pneumococcal) vaccine is recommended for everyone 2 years or older who has diabetes. If you are 65 or older, you may get the pneumonia vaccine as a series of two separate shots.  The hepatitis B vaccine is recommended for adults shortly after being diagnosed with diabetes.  Adults and children with diabetes should receive all other vaccines  according to age-specific recommendations from the Centers for Disease Control and Prevention (CDC).  Mental and emotional health  Screening for symptoms of eating disorders, anxiety, and depression is recommended at the time of diagnosis and afterward as needed. If your screening shows that you have symptoms (positive screening result), you may need more evaluation and you may work with a mental health care provider.  Treatment plan  Your treatment plan will be reviewed at every medical visit. You and your health care provider will discuss:  How you are taking your medicines, including insulin.  Any side effects you are experiencing.  Your blood glucose target goals.  The frequency of your blood glucose monitoring.  Lifestyle habits, such as activity level as well as tobacco, alcohol, and substance use.  Diabetes self-management education  Your health care provider will assess how well you are monitoring your blood glucose levels and whether you are taking your insulin correctly. He or she may refer you to:  A certified diabetes educator to manage your diabetes throughout your life, starting at diagnosis.  A registered dietitian who can create or review your personal nutrition plan.  An exercise specialist who can discuss your activity level and exercise plan.  Summary  Managing diabetes (diabetes mellitus) can be complicated. Your diabetes treatment may be managed by a team of health care providers.  Your health care providers follow guidelines in order to help you get the best quality of care.  Standards of care including having regular physical exams, blood tests, blood pressure monitoring, immunizations, screening tests, and education about how to manage your diabetes.  Your health care providers may also give you more specific instructions based on your individual health.  This information is not intended to replace advice given to you by your health care provider. Make sure you discuss any questions you have  with your health care provider.  Document Released: 10/26/2008 Document Revised: 09/17/2017 Document Reviewed: 09/27/2015  Elsevier Interactive Patient Education  2019 Elsevier Inc.

## 2018-06-18 LAB — COMPLETE METABOLIC PANEL WITH GFR
AG Ratio: 1.9 (calc) (ref 1.0–2.5)
ALT: 26 U/L (ref 9–46)
AST: 17 U/L (ref 10–35)
Albumin: 4.4 g/dL (ref 3.6–5.1)
Alkaline phosphatase (APISO): 54 U/L (ref 35–144)
BUN: 12 mg/dL (ref 7–25)
CO2: 24 mmol/L (ref 20–32)
Calcium: 8.6 mg/dL (ref 8.6–10.3)
Chloride: 106 mmol/L (ref 98–110)
Creat: 0.73 mg/dL (ref 0.70–1.18)
GFR, Est African American: 107 mL/min/{1.73_m2} (ref >=60)
GFR, Est Non African American: 92 mL/min/{1.73_m2} (ref >=60)
Globulin: 2.3 g/dL (calc) (ref 1.9–3.7)
Glucose, Bld: 159 mg/dL — ABNORMAL HIGH (ref 65–99)
Potassium: 3.5 mmol/L (ref 3.5–5.3)
Sodium: 140 mmol/L (ref 135–146)
Total Bilirubin: 0.6 mg/dL (ref 0.2–1.2)
Total Protein: 6.7 g/dL (ref 6.1–8.1)

## 2018-06-18 LAB — HEMOGLOBIN A1C
Hgb A1c MFr Bld: 8.4 % of total Hgb — ABNORMAL HIGH (ref <5.7)
Mean Plasma Glucose: 194 (calc)
eAG (mmol/L): 10.8 (calc)

## 2018-06-18 LAB — LIPID PANEL
Cholesterol: 106 mg/dL (ref <200)
HDL: 63 mg/dL (ref >=40)
LDL Cholesterol (Calc): 30 mg/dL (calc)
Non-HDL Cholesterol (Calc): 43 mg/dL (calc) (ref <130)
Total CHOL/HDL Ratio: 1.7 (calc) (ref <5.0)
Triglycerides: 54 mg/dL (ref <150)

## 2018-06-21 ENCOUNTER — Ambulatory Visit (INDEPENDENT_AMBULATORY_CARE_PROVIDER_SITE_OTHER): Payer: Medicare Other | Admitting: Nurse Practitioner

## 2018-06-21 ENCOUNTER — Encounter: Payer: Self-pay | Admitting: Nurse Practitioner

## 2018-06-21 ENCOUNTER — Other Ambulatory Visit: Payer: Self-pay

## 2018-06-21 VITALS — BP 132/80 | HR 83 | Temp 98.3°F | Resp 14 | Ht 65.0 in | Wt 166.4 lb

## 2018-06-21 DIAGNOSIS — E1165 Type 2 diabetes mellitus with hyperglycemia: Secondary | ICD-10-CM | POA: Diagnosis not present

## 2018-06-21 DIAGNOSIS — IMO0002 Reserved for concepts with insufficient information to code with codable children: Secondary | ICD-10-CM

## 2018-06-21 DIAGNOSIS — E1140 Type 2 diabetes mellitus with diabetic neuropathy, unspecified: Secondary | ICD-10-CM | POA: Diagnosis not present

## 2018-06-21 MED ORDER — EMPAGLIFLOZIN 10 MG PO TABS: 10.0000 mg | ORAL_TABLET | Freq: Every day | ORAL | 2 refills | Status: DC
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
## 2018-06-21 NOTE — Patient Instructions (Addendum)
-   Check blood sugars twice a week fasting (goal is 90-160) or if you are feeling off.  - If your sugars are under 90 please let us know - We would eventually like to get you off the glimepiride to prevent low blood sugars so we will cut this medicine in half when you start taking the Jardiance.  Foods and drinks to limit include  . fried foods and other foods high in saturated fat and trans fat  . foods high in salt, also called sodium  . sweets, such as baked goods, candy, and ice cream  . beverages with added sugars, such as juice, regular soda, and regular sports or energy drinks  Drink water instead of sweetened beverages. Consider using a sugar substitute in your coffee or tea.   Instead, eat carbohydrates from fruit, vegetables, whole grains, beans, and low-fat or nonfat milk. Choose healthy carbohydrates, such as fruit, vegetables, whole grains, beans, and low-fat milk, as part of your diabetes meal plan.

## 2018-06-22 ENCOUNTER — Ambulatory Visit: Payer: Self-pay

## 2018-06-22 DIAGNOSIS — IMO0002 Reserved for concepts with insufficient information to code with codable children: Secondary | ICD-10-CM

## 2018-06-22 DIAGNOSIS — E1140 Type 2 diabetes mellitus with diabetic neuropathy, unspecified: Secondary | ICD-10-CM

## 2018-06-22 NOTE — Patient Instructions (Signed)
1. Thank You for allowing the CCM (Chronic Care Management) Team to assist you with your healthcare goals!! Pharmacist Cecille Rubin looks forward to speaking with you on Tuesday 6/23 at 10:00  2. Please have ALL medications to review at your appointment! If you have a blood sugar meter or a blood pressure monitor at home, bring those as well.  3.  Contact the CCM Team if you have any question or need to reschedule your initial visit.  CCM (Chronic Care Management) Team   Trish Fountain RN, BSN Nurse Care Coordinator  [phone]  Ruben Reason PharmD  Clinical Pharmacist  [phone]   Elliot Gurney, LCSW Clinical Social Worker [phone]  Mr. Brueckner was given information about Chronic Care Management services today including:  1. CCM service includes personalized support from designated clinical staff supervised by his physician, including individualized plan of care and coordination with other care providers 2. 24/7 contact phone numbers for assistance for urgent and routine care needs. 3. Service will only be billed when office clinical staff spend 20 minutes or more in a month to coordinate care. 4. Only one practitioner may furnish and bill the service in a calendar month. 5. The patient may stop CCM services at any time (effective at the end of the month) by phone call to the office staff. 6. The patient will be responsible for cost sharing (co-pay) of up to 20% of the service fee (after annual deductible is met).  Patient agreed to services and verbal consent obtained.

## 2018-06-22 NOTE — Chronic Care Management (AMB) (Signed)
  Chronic Care Management   Note  06/22/2018 Name: Aaron Calhoun. MRN: [id number] DOB: 05/28/1945  Aaron Calhoun. is a 73 year old male who sees Aaron Derry, MD for primary care. Aaron Cheshire, NP asked the CCM team to consult the patient for chronic case management secondary to need for pharmacist to determine coverage for Jardiance and assistance with weaning patient off glimepride. Patient has a history of but not limited to DM, HTN, Hyperlipidemia. Referral was placed 06/21/2018 during office visit. Telephone outreach to patient today to introduce CCM services.  [name] was given information about Chronic Care Management services today including:  1. CCM service includes personalized support from designated clinical staff supervised by his physician, including individualized plan of care and coordination with other care providers 2. 24/7 contact phone numbers for assistance for urgent and routine care needs. 3. Service will only be billed when office clinical staff spend 20 minutes or more in a month to coordinate care. 4. Only one practitioner may furnish and bill the service in a calendar month. 5. The patient may stop CCM services at any time (effective at the end of the month) by phone call to the office staff. 6. The patient will be responsible for cost sharing (co-pay) of up to 20% of the service fee (after annual deductible is met).  Patient agreed to services and verbal consent obtained.      Plan: Initial Pharmacy telephone appointment 07/05/2018 at 10:00 for Jardiance coverage and assistance with weaning off Glimepride.    Aaron Milberger E. Rollene Rotunda, RN, BSN Nurse Care Coordinator [facility] Care Management  [phone]

## 2018-07-05 ENCOUNTER — Ambulatory Visit (INDEPENDENT_AMBULATORY_CARE_PROVIDER_SITE_OTHER): Payer: Medicare Other | Admitting: Pharmacist

## 2018-07-05 DIAGNOSIS — E1165 Type 2 diabetes mellitus with hyperglycemia: Secondary | ICD-10-CM

## 2018-07-05 DIAGNOSIS — E1140 Type 2 diabetes mellitus with diabetic neuropathy, unspecified: Secondary | ICD-10-CM | POA: Diagnosis not present

## 2018-07-05 DIAGNOSIS — IMO0002 Reserved for concepts with insufficient information to code with codable children: Secondary | ICD-10-CM

## 2018-07-05 NOTE — Chronic Care Management (AMB) (Signed)
  Chronic Care Management   Follow Up Note   07/05/2018 Name: Aaron Calhoun. MRN: [id number] DOB: 1945/10/01  Referred by: Suezanne Cheshire  Reason for referral : Chronic Care Management (Medication assistance)   Son [name]. is a 73 y.o. year old male who is a primary care patient of  Dr. Enid Derry and Suezanne Cheshire NP. CCM clinical pharmacist consulted for medication assistance (Jardiance) and assistance discontinuing glimepride   Review of patient status, including review of consultants reports, relevant laboratory and other test results, and collaboration with appropriate care team members and the patient's provider was performed as part of comprehensive patient evaluation and provision of chronic care management services.    Goals Addressed            This Visit's Progress   . Jardiance is expensive (pt-stated)       Current Barriers:  . financial  Pharmacist Clinical Goal(s): Over the next 14 days, [name] will provide the necessary supplementary documents (proof of out of pocket prescription expenditure, proof of household income) needed for medication assistance applications to CCM pharmacist.   Interventions: . Comprehensive medication review . CCM pharmacist will apply for medication assistance program for Jardiance made by [employer] and prescribed by Suezanne Cheshire NP  Patient Self Care Activities:  [name] Gather necessary documents needed to apply for medication assistance  Initial goal documentation        Follow Up: Telephone follow up appointment with care management team member scheduled for: 2 weeks with PharmD  PharmD will investigate any necessary taper of glimepride and follow up with prescriber by end of week.    Ruben Reason, PharmD Clinical Pharmacist [facility] [phone]

## 2018-07-07 ENCOUNTER — Encounter: Payer: Self-pay | Admitting: Podiatry

## 2018-07-07 ENCOUNTER — Ambulatory Visit (INDEPENDENT_AMBULATORY_CARE_PROVIDER_SITE_OTHER): Payer: Medicare Other | Admitting: Podiatry

## 2018-07-07 ENCOUNTER — Other Ambulatory Visit: Payer: Self-pay

## 2018-07-07 DIAGNOSIS — E1140 Type 2 diabetes mellitus with diabetic neuropathy, unspecified: Secondary | ICD-10-CM

## 2018-07-07 DIAGNOSIS — M79675 Pain in left toe(s): Secondary | ICD-10-CM | POA: Diagnosis not present

## 2018-07-07 DIAGNOSIS — M79674 Pain in right toe(s): Secondary | ICD-10-CM

## 2018-07-07 DIAGNOSIS — B351 Tinea unguium: Secondary | ICD-10-CM

## 2018-07-07 NOTE — Progress Notes (Signed)
This patient presents to the office with chief complaint of long thick nails and diabetic feet.  This patient  says there  is  no pain and discomfort in his  feet.  This patient says there are long thick painful nails.  These nails are painful walking and wearing shoes.  Patient has no history of infection or drainage from both feet.  Patient is unable to  self treat his own nails . This patient presents  to the office today for treatment of the  long nails and a foot evaluation due to history of  diabetes.  General Appearance  Alert, conversant and in no acute stress.  Vascular  Dorsalis pedis and posterior tibial  pulses are palpable  bilaterally.  Capillary return is within normal limits  bilaterally. Temperature is within normal limits  bilaterally.  Neurologic  Senn-Weinstein monofilament wire test within normal limits  bilaterally. Muscle power within normal limits bilaterally.  Nails Thick disfigured discolored nails with subungual debris  from hallux to fifth toes bilaterally. No evidence of bacterial infection or drainage bilaterally.  Orthopedic  No limitations of motion of motion feet .  No crepitus or effusions noted.  No bony pathology or digital deformities noted. HAV  B/L  Skin  normotropic skin with no porokeratosis noted bilaterally.  No signs of infections or ulcers noted.     Onychomycosis  Diabetes with no foot complications  IE  Debride nails x 10.  A diabetic foot exam was performed and there is no evidence of any vascular or neurologic pathology.   RTC 3 months.   Gardiner Barefoot DPM

## 2018-07-19 ENCOUNTER — Telehealth: Payer: Self-pay | Admitting: Nurse Practitioner

## 2018-07-19 NOTE — Telephone Encounter (Signed)
We can do PA or let me know what alternates we can use.

## 2018-07-19 NOTE — Telephone Encounter (Signed)
Sherri called from [employer] regarding "empagliflozin (JARDIANCE) 10 MG TABS tablet" she is requesting a tier exception.  Call back # [phone]

## 2018-07-20 NOTE — Telephone Encounter (Addendum)
I have not received any information on a PA for Jardiance needless to say an appeal for a Tier exception, but I will see what I can find out.  I manually initiated a PA for Jardiance 10mg  tablet via CoverMyMeds. awaiting a determination from the insurance company.   N/A today This medication or product is on your plan's list of covered drugs. Prior authorization is not required at this time. If your pharmacy has questions regarding the processing of your prescription, please have them call the [facility] pharmacy help desk at [phone]. **Please note: Formulary lowering, tiering exception, cost reduction and prospective Medicare hospice reviews cannot be requested using this method of submission. Please contact us at [phone] instead.

## 2018-07-21 NOTE — Telephone Encounter (Signed)
Thank you please inform pharmacy to ensure patient gets his medications

## 2018-07-25 NOTE — Telephone Encounter (Addendum)
Spoke with the rep at [facility] and was informed that this patient should receive his 38- day supply no later August 02, 2018.

## 2018-07-26 ENCOUNTER — Ambulatory Visit: Payer: Self-pay | Admitting: Pharmacist

## 2018-07-26 ENCOUNTER — Other Ambulatory Visit: Payer: Self-pay | Admitting: Family Medicine

## 2018-07-26 DIAGNOSIS — IMO0002 Reserved for concepts with insufficient information to code with codable children: Secondary | ICD-10-CM

## 2018-07-26 DIAGNOSIS — E1140 Type 2 diabetes mellitus with diabetic neuropathy, unspecified: Secondary | ICD-10-CM

## 2018-07-26 DIAGNOSIS — I10 Essential (primary) hypertension: Secondary | ICD-10-CM

## 2018-07-26 NOTE — Patient Instructions (Signed)
Thank you for taking the time to speak with me today!   Please call a member of the CCM (Chronic Care Management) Team with any questions or case management needs:   Portia Payne, Rn, BSN Nurse Care Coordinator  [phone]  Delina Kruczek, PharmD  Clinical Pharmacist  [phone]  Chrystal Land, LCSW Clinical Social Worker [phone]   

## 2018-07-26 NOTE — Chronic Care Management (AMB) (Signed)
  Chronic Care Management   Follow Up Note   07/26/2018 Name: Corian Handley. MRN: [id number] DOB: [birth year]  Referred by: Arnetha Courser, MD Reason for referral : Chronic Care Management (Pharmacy follow up)   [name] is a 73 y.o. year old male who is a primary care patient of Suezanne Cheshire, NP. The CCM clinical pharmacist is following up with patient regarding medication assistance for Jardiance. HIPAA identifiers verified.   Review of patient status, including review of consultants reports, relevant laboratory and other test results, and collaboration with appropriate care team members and the patient's provider was performed as part of comprehensive patient evaluation and provision of chronic care management services.    Goals Addressed            This Visit's Progress   . COMPLETED: Vania Rea is expensive (pt-stated)       Current Barriers:  . financial  Pharmacist Clinical Goal(s): Over the next 14 days, Mr.. Frasier will provide the necessary supplementary documents (proof of out of pocket prescription expenditure, proof of household income) needed for medication assistance applications to CCM pharmacist.   Interventions: . Comprehensive medication review . CCM pharmacist will apply for medication assistance program for Jardiance made by [employer] and prescribed by Suezanne Cheshire NP . Updated 07/26/18: PA completed by [place], who confirmed with [facility] that 73 DS will be shipped to patient's house, arriving no later than 08/02/18  Patient Self Care Activities:  [name] Gather necessary documents needed to apply for medication assistance  Initial goal documentation         The patient will call CCM clinical pharmacist* as advised to if he requires copay assistance for Jardiance in the Medicare coverage gap.    Provided patient with CCM pharmacist contact information.   Ruben Reason, PharmD Clinical  Pharmacist [facility] [phone]

## 2018-08-08 ENCOUNTER — Other Ambulatory Visit: Payer: Self-pay | Admitting: Family Medicine

## 2018-10-10 ENCOUNTER — Encounter: Payer: Self-pay | Admitting: Podiatry

## 2018-10-10 ENCOUNTER — Ambulatory Visit (INDEPENDENT_AMBULATORY_CARE_PROVIDER_SITE_OTHER): Payer: Medicare Other | Admitting: Podiatry

## 2018-10-10 ENCOUNTER — Other Ambulatory Visit: Payer: Self-pay

## 2018-10-10 ENCOUNTER — Other Ambulatory Visit: Payer: Self-pay | Admitting: Family Medicine

## 2018-10-10 DIAGNOSIS — E1140 Type 2 diabetes mellitus with diabetic neuropathy, unspecified: Secondary | ICD-10-CM | POA: Diagnosis not present

## 2018-10-10 DIAGNOSIS — M79674 Pain in right toe(s): Secondary | ICD-10-CM | POA: Diagnosis not present

## 2018-10-10 DIAGNOSIS — E785 Hyperlipidemia, unspecified: Secondary | ICD-10-CM

## 2018-10-10 DIAGNOSIS — M79675 Pain in left toe(s): Secondary | ICD-10-CM | POA: Diagnosis not present

## 2018-10-10 DIAGNOSIS — B351 Tinea unguium: Secondary | ICD-10-CM

## 2018-10-10 NOTE — Progress Notes (Signed)
Complaint:  Visit Type: Patient returns to my office for continued preventative foot care services. Complaint: Patient states" my nails have grown long and thick and become painful to walk and wear shoes" Patient has been diagnosed with DM with no foot complications. The patient presents for preventative foot care services. No changes to ROS  Podiatric Exam: Vascular: dorsalis pedis and posterior tibial pulses are palpable bilateral. Capillary return is immediate. Temperature gradient is WNL. Skin turgor WNL  Sensorium: Normal Semmes Weinstein monofilament test. Normal tactile sensation bilaterally. Nail Exam: Pt has thick disfigured discolored nails with subungual debris noted bilateral entire nail hallux through fifth toenails Ulcer Exam: There is no evidence of ulcer or pre-ulcerative changes or infection. Orthopedic Exam: Muscle tone and strength are WNL. No limitations in general ROM. No crepitus or effusions noted. Foot type and digits show no abnormalities. Bony prominences are unremarkable. Skin: No Porokeratosis. No infection or ulcers  Diagnosis:  Onychomycosis, , Pain in right toe, pain in left toes  Treatment & Plan Procedures and Treatment: Consent by patient was obtained for treatment procedures.   Debridement of mycotic and hypertrophic toenails, 1 through 5 bilateral and clearing of subungual debris. No ulceration, no infection noted.  Return Visit-Office Procedure: Patient instructed to return to the office for a follow up visit 3 months for continued evaluation and treatment.    Gladyse Corvin DPM 

## 2018-10-19 ENCOUNTER — Ambulatory Visit: Payer: Medicare Other

## 2018-10-19 ENCOUNTER — Other Ambulatory Visit: Payer: Self-pay

## 2018-10-19 DIAGNOSIS — Z23 Encounter for immunization: Secondary | ICD-10-CM

## 2018-10-21 ENCOUNTER — Other Ambulatory Visit: Payer: Self-pay

## 2018-10-21 ENCOUNTER — Ambulatory Visit (INDEPENDENT_AMBULATORY_CARE_PROVIDER_SITE_OTHER): Payer: Medicare Other | Admitting: Family Medicine

## 2018-10-21 ENCOUNTER — Encounter: Payer: Self-pay | Admitting: Family Medicine

## 2018-10-21 VITALS — BP 120/60 | HR 75 | Temp 97.9°F | Resp 14 | Ht 65.0 in | Wt 160.8 lb

## 2018-10-21 DIAGNOSIS — E1165 Type 2 diabetes mellitus with hyperglycemia: Secondary | ICD-10-CM

## 2018-10-21 DIAGNOSIS — N401 Enlarged prostate with lower urinary tract symptoms: Secondary | ICD-10-CM | POA: Diagnosis not present

## 2018-10-21 DIAGNOSIS — I10 Essential (primary) hypertension: Secondary | ICD-10-CM | POA: Diagnosis not present

## 2018-10-21 DIAGNOSIS — E1140 Type 2 diabetes mellitus with diabetic neuropathy, unspecified: Secondary | ICD-10-CM

## 2018-10-21 DIAGNOSIS — IMO0002 Reserved for concepts with insufficient information to code with codable children: Secondary | ICD-10-CM

## 2018-10-21 DIAGNOSIS — Z5181 Encounter for therapeutic drug level monitoring: Secondary | ICD-10-CM

## 2018-10-21 DIAGNOSIS — E7800 Pure hypercholesterolemia, unspecified: Secondary | ICD-10-CM

## 2018-10-21 DIAGNOSIS — R351 Nocturia: Secondary | ICD-10-CM

## 2018-10-21 DIAGNOSIS — Z125 Encounter for screening for malignant neoplasm of prostate: Secondary | ICD-10-CM

## 2018-10-21 DIAGNOSIS — N529 Male erectile dysfunction, unspecified: Secondary | ICD-10-CM

## 2018-10-21 DIAGNOSIS — G8928 Other chronic postprocedural pain: Secondary | ICD-10-CM

## 2018-10-21 MED ORDER — JARDIANCE 10 MG PO TABS: 10.0000 mg | ORAL_TABLET | Freq: Every day | ORAL | 2 refills | Status: DC

## 2018-10-21 MED ORDER — FINASTERIDE 5 MG PO TABS: 5.0000 mg | ORAL_TABLET | Freq: Every day | ORAL | 1 refills | Status: DC

## 2018-10-21 MED ORDER — SILDENAFIL CITRATE 20 MG PO TABS: ORAL_TABLET | ORAL | 0 refills | Status: DC

## 2018-10-21 MED ORDER — METFORMIN HCL 1000 MG PO TABS: 1000.0000 mg | ORAL_TABLET | Freq: Two times a day (BID) | ORAL | 1 refills | Status: DC

## 2018-10-21 MED ORDER — GLIMEPIRIDE 4 MG PO TABS: ORAL_TABLET | ORAL | 1 refills | Status: DC

## 2018-10-21 MED ORDER — LOSARTAN POTASSIUM-HCTZ 100-25 MG PO TABS: 1.0000 | ORAL_TABLET | Freq: Every day | ORAL | 3 refills | Status: DC

## 2018-10-21 MED ORDER — GABAPENTIN 300 MG PO CAPS: 300.0000 mg | ORAL_CAPSULE | Freq: Three times a day (TID) | ORAL | 1 refills | Status: DC

## 2018-10-21 MED ORDER — TAMSULOSIN HCL 0.4 MG PO CAPS: 0.8000 mg | ORAL_CAPSULE | Freq: Every day | ORAL | 1 refills | Status: DC
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
## 2018-10-21 NOTE — Patient Instructions (Signed)
Follow up with me right away if you are having any low blood sugar episodes

## 2018-10-22 LAB — CBC WITH DIFFERENTIAL/PLATELET
Absolute Monocytes: 495 cells/uL (ref 200–950)
Basophils Absolute: 20 cells/uL (ref 0–200)
Basophils Relative: 0.4 %
Eosinophils Absolute: 281 cells/uL (ref 15–500)
Eosinophils Relative: 5.5 %
HCT: 46.9 % (ref 38.5–50.0)
Hemoglobin: 16 g/dL (ref 13.2–17.1)
Lymphs Abs: 2219 cells/uL (ref 850–3900)
MCH: 32.3 pg (ref 27.0–33.0)
MCHC: 34.1 g/dL (ref 32.0–36.0)
MCV: 94.6 fL (ref 80.0–100.0)
MPV: 10.8 fL (ref 7.5–12.5)
Monocytes Relative: 9.7 %
Neutro Abs: 2086 cells/uL (ref 1500–7800)
Neutrophils Relative %: 40.9 %
Platelets: 212 10*3/uL (ref 140–400)
RBC: 4.96 10*6/uL (ref 4.20–5.80)
RDW: 12.3 % (ref 11.0–15.0)
Total Lymphocyte: 43.5 %
WBC: 5.1 10*3/uL (ref 3.8–10.8)

## 2018-10-22 LAB — COMPLETE METABOLIC PANEL WITH GFR
AG Ratio: 1.8 (calc) (ref 1.0–2.5)
ALT: 27 U/L (ref 9–46)
AST: 19 U/L (ref 10–35)
Albumin: 4.8 g/dL (ref 3.6–5.1)
Alkaline phosphatase (APISO): 58 U/L (ref 35–144)
BUN: 18 mg/dL (ref 7–25)
CO2: 28 mmol/L (ref 20–32)
Calcium: 9.8 mg/dL (ref 8.6–10.3)
Chloride: 101 mmol/L (ref 98–110)
Creat: 0.95 mg/dL (ref 0.70–1.18)
GFR, Est African American: 92 mL/min/{1.73_m2} (ref >=60)
GFR, Est Non African American: 79 mL/min/{1.73_m2} (ref >=60)
Globulin: 2.6 g/dL (calc) (ref 1.9–3.7)
Glucose, Bld: 129 mg/dL — ABNORMAL HIGH (ref 65–99)
Potassium: 4.2 mmol/L (ref 3.5–5.3)
Sodium: 139 mmol/L (ref 135–146)
Total Bilirubin: 0.6 mg/dL (ref 0.2–1.2)
Total Protein: 7.4 g/dL (ref 6.1–8.1)

## 2018-10-22 LAB — LIPID PANEL
Cholesterol: 102 mg/dL (ref <200)
HDL: 63 mg/dL (ref >=40)
LDL Cholesterol (Calc): 24 mg/dL (calc)
Non-HDL Cholesterol (Calc): 39 mg/dL (calc) (ref <130)
Total CHOL/HDL Ratio: 1.6 (calc) (ref <5.0)
Triglycerides: 72 mg/dL (ref <150)

## 2018-10-22 LAB — HEMOGLOBIN A1C
Hgb A1c MFr Bld: 6.6 % of total Hgb — ABNORMAL HIGH (ref <5.7)
Mean Plasma Glucose: 143 (calc)
eAG (mmol/L): 7.9 (calc)

## 2018-10-22 LAB — PSA: PSA: 0.3 ng/mL (ref <=4.0)

## 2018-10-22 LAB — MICROALBUMIN, URINE: Microalb, Ur: <0.2 mg/dL

## 2018-12-20 ENCOUNTER — Other Ambulatory Visit: Payer: Self-pay | Admitting: Family Medicine

## 2018-12-20 DIAGNOSIS — N529 Male erectile dysfunction, unspecified: Secondary | ICD-10-CM

## 2019-01-09 ENCOUNTER — Other Ambulatory Visit: Payer: Self-pay

## 2019-01-09 ENCOUNTER — Encounter: Payer: Self-pay | Admitting: Podiatry

## 2019-01-09 ENCOUNTER — Ambulatory Visit: Payer: Medicare Other | Admitting: Podiatry

## 2019-01-09 DIAGNOSIS — M79675 Pain in left toe(s): Secondary | ICD-10-CM

## 2019-01-09 DIAGNOSIS — M79674 Pain in right toe(s): Secondary | ICD-10-CM

## 2019-01-09 DIAGNOSIS — B351 Tinea unguium: Secondary | ICD-10-CM

## 2019-01-09 DIAGNOSIS — E1140 Type 2 diabetes mellitus with diabetic neuropathy, unspecified: Secondary | ICD-10-CM | POA: Diagnosis not present

## 2019-01-09 NOTE — Progress Notes (Signed)
Complaint:  Visit Type: Patient returns to my office for continued preventative foot care services. Complaint: Patient states" my nails have grown long and thick and become painful to walk and wear shoes" Patient has been diagnosed with DM with no foot complications. The patient presents for preventative foot care services. No changes to ROS  Podiatric Exam: Vascular: dorsalis pedis and posterior tibial pulses are palpable bilateral. Capillary return is immediate. Temperature gradient is WNL. Skin turgor WNL  Sensorium: Normal Semmes Weinstein monofilament test. Normal tactile sensation bilaterally. Nail Exam: Pt has thick disfigured discolored nails with subungual debris noted bilateral entire nail hallux through fifth toenails Ulcer Exam: There is no evidence of ulcer or pre-ulcerative changes or infection. Orthopedic Exam: Muscle tone and strength are WNL. No limitations in general ROM. No crepitus or effusions noted. Foot type and digits show no abnormalities. Bony prominences are unremarkable. Skin: No Porokeratosis. No infection or ulcers  Diagnosis:  Onychomycosis, , Pain in right toe, pain in left toes  Treatment & Plan Procedures and Treatment: Consent by patient was obtained for treatment procedures.   Debridement of mycotic and hypertrophic toenails, 1 through 5 bilateral and clearing of subungual debris. No ulceration, no infection noted.  Return Visit-Office Procedure: Patient instructed to return to the office for a follow up visit 3 months for continued evaluation and treatment.    Zailyn Thoennes DPM 

## 2019-01-23 ENCOUNTER — Ambulatory Visit (INDEPENDENT_AMBULATORY_CARE_PROVIDER_SITE_OTHER): Payer: Medicare Other | Admitting: Family Medicine

## 2019-01-23 ENCOUNTER — Encounter: Payer: Self-pay | Admitting: Family Medicine

## 2019-01-23 ENCOUNTER — Other Ambulatory Visit: Payer: Self-pay

## 2019-01-23 VITALS — BP 132/70 | HR 84 | Temp 96.9°F | Resp 12 | Ht 65.0 in | Wt 164.2 lb

## 2019-01-23 DIAGNOSIS — I10 Essential (primary) hypertension: Secondary | ICD-10-CM | POA: Diagnosis not present

## 2019-01-23 DIAGNOSIS — E785 Hyperlipidemia, unspecified: Secondary | ICD-10-CM | POA: Diagnosis not present

## 2019-01-23 DIAGNOSIS — E1140 Type 2 diabetes mellitus with diabetic neuropathy, unspecified: Secondary | ICD-10-CM

## 2019-01-23 DIAGNOSIS — N529 Male erectile dysfunction, unspecified: Secondary | ICD-10-CM

## 2019-01-23 DIAGNOSIS — E119 Type 2 diabetes mellitus without complications: Secondary | ICD-10-CM

## 2019-01-23 MED ORDER — ROSUVASTATIN CALCIUM 5 MG PO TABS: 5.0000 mg | ORAL_TABLET | Freq: Every day | ORAL | 1 refills | Status: DC

## 2019-01-23 MED ORDER — SILDENAFIL CITRATE 20 MG PO TABS: ORAL_TABLET | ORAL | 2 refills | Status: DC

## 2019-01-23 MED ORDER — JARDIANCE 10 MG PO TABS: 10.0000 mg | ORAL_TABLET | Freq: Every day | ORAL | 2 refills | Status: DC

## 2019-01-23 MED ORDER — ONETOUCH ULTRASOFT LANCETS MISC: 12 refills | Status: DC
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
## 2019-01-23 NOTE — Patient Instructions (Signed)
Please follow up with [place] sooner than your next appt if your sugars are too high or too low in the morning.  Goal would be about 80-130 in the morning before eating.   Type 2 Diabetes Mellitus, Diagnosis, Adult Type 2 diabetes (type 2 diabetes mellitus) is a long-term (chronic) disease. It may be caused by one or both of these problems:  Your pancreas does not make enough of a hormone called insulin.  Your body does not react in a normal way to insulin that it makes. Insulin lets sugars (glucose) go into cells in your body. This gives you energy. If you have type 2 diabetes, sugars cannot get into cells. This causes high blood sugar (hyperglycemia). Your doctor will set treatment goals for you. Generally, you should have these blood sugar levels:  Before meals (preprandial): 80-130 mg/dL (4.4-7.2 mmol/L).  After meals (postprandial): below 180 mg/dL (10 mmol/L).  A1c (hemoglobin A1c) level: less than 7%. Follow these instructions at home: Questions to ask your doctor  You may want to ask these questions: ? Do I need to meet with a diabetes educator? ? Where can I find a support group for people with diabetes? ? What equipment will I need to care for myself at home? ? What diabetes medicines do I need? When should I take them? ? How often do I need to check my blood sugar? ? What number can I call if I have questions? ? When is my next doctor's visit? General instructions  Take over-the-counter and prescription medicines only as told by your doctor.  Keep all follow-up visits as told by your doctor. This is important. Contact a doctor if:  Your blood sugar is at or above 240 mg/dL (13.3 mmol/L) for 2 days in a row.  You have been sick for 2 days or more, and you are not getting better.  You have had a fever for 2 days or more, and you are not getting better.  You have any of these problems for more than 6 hours: ? You cannot eat or drink. ? You feel sick to your stomach  (nauseous). ? You throw up (vomit). ? You have watery poop (diarrhea). Get help right away if:  Your blood sugar is lower than 54 mg/dL (3 mmol/L).  You get confused.  You have trouble: ? Thinking clearly. ? Breathing.  You have moderate or large ketone levels in your pee (urine). Summary  Type 2 diabetes is a long-term (chronic) disease. Your pancreas may not make enough of a hormone called insulin, or your body may not react normally to insulin that it makes.  Take over-the-counter and prescription medicines only as told by your doctor.  Keep all follow-up visits as told by your doctor. This is important. This information is not intended to replace advice given to you by your health care provider. Make sure you discuss any questions you have with your health care provider. Document Revised: 02/26/2017 Document Reviewed: 02/01/2015 Elsevier Patient Education  2020 [employer].

## 2019-01-24 LAB — LIPID PANEL
Cholesterol: 118 mg/dL (ref <200)
HDL: 56 mg/dL (ref >=40)
LDL Cholesterol (Calc): 46 mg/dL (calc)
Non-HDL Cholesterol (Calc): 62 mg/dL (calc) (ref <130)
Total CHOL/HDL Ratio: 2.1 (calc) (ref <5.0)
Triglycerides: 76 mg/dL (ref <150)

## 2019-01-24 LAB — COMPLETE METABOLIC PANEL WITH GFR
AG Ratio: 1.9 (calc) (ref 1.0–2.5)
ALT: 21 U/L (ref 9–46)
AST: 16 U/L (ref 10–35)
Albumin: 4.8 g/dL (ref 3.6–5.1)
Alkaline phosphatase (APISO): 54 U/L (ref 35–144)
BUN: 17 mg/dL (ref 7–25)
CO2: 28 mmol/L (ref 20–32)
Calcium: 9.6 mg/dL (ref 8.6–10.3)
Chloride: 101 mmol/L (ref 98–110)
Creat: 0.84 mg/dL (ref 0.70–1.18)
GFR, Est African American: 100 mL/min/{1.73_m2} (ref >=60)
GFR, Est Non African American: 86 mL/min/{1.73_m2} (ref >=60)
Globulin: 2.5 g/dL (calc) (ref 1.9–3.7)
Glucose, Bld: 130 mg/dL — ABNORMAL HIGH (ref 65–99)
Potassium: 4.3 mmol/L (ref 3.5–5.3)
Sodium: 138 mmol/L (ref 135–146)
Total Bilirubin: 0.7 mg/dL (ref 0.2–1.2)
Total Protein: 7.3 g/dL (ref 6.1–8.1)

## 2019-01-24 LAB — HEMOGLOBIN A1C
Hgb A1c MFr Bld: 7 % of total Hgb — ABNORMAL HIGH (ref <5.7)
Mean Plasma Glucose: 154 (calc)
eAG (mmol/L): 8.5 (calc)

## 2019-02-07 ENCOUNTER — Ambulatory Visit: Payer: Medicare Other | Attending: Internal Medicine

## 2019-02-07 DIAGNOSIS — Z20822 Contact with and (suspected) exposure to covid-19: Secondary | ICD-10-CM

## 2019-02-08 LAB — NOVEL CORONAVIRUS, NAA: SARS-CoV-2, NAA: NOT DETECTED

## 2019-02-09 ENCOUNTER — Telehealth: Payer: Self-pay | Admitting: Family Medicine

## 2019-02-09 DIAGNOSIS — E1140 Type 2 diabetes mellitus with diabetic neuropathy, unspecified: Secondary | ICD-10-CM

## 2019-02-09 DIAGNOSIS — E119 Type 2 diabetes mellitus without complications: Secondary | ICD-10-CM

## 2019-02-09 NOTE — Telephone Encounter (Signed)
Sonia Baller from the Pharmacy called in regards to pats current prescription/ Lancets for blood sugar testing use as instructed and pharmacy needed to know how many test strips should pt use a day that way they can bill the insurance company correctly   Reference number # [id number]

## 2019-02-10 MED ORDER — ONETOUCH ULTRASOFT LANCETS MISC: 12 refills | Status: DC

## 2019-02-16 ENCOUNTER — Other Ambulatory Visit: Payer: Medicare Other

## 2019-02-24 ENCOUNTER — Ambulatory Visit: Payer: Medicare Other | Attending: Internal Medicine

## 2019-02-24 ENCOUNTER — Other Ambulatory Visit: Payer: Self-pay

## 2019-02-24 DIAGNOSIS — Z23 Encounter for immunization: Secondary | ICD-10-CM | POA: Insufficient documentation

## 2019-02-24 NOTE — Progress Notes (Signed)
   [id number]  Name:  Aaron Calhoun.    MRN: [id number] DOB: [birth year]  02/24/2019  [name] was observed post Covid-19 immunization for 15 minutes without incidence. He was provided with Vaccine Information Sheet and instruction to access the V-Safe system.   [name] was instructed to call 911 with any severe reactions post vaccine: [name] Difficulty breathing  . Swelling of your face and throat  . A fast heartbeat  . A bad rash all over your body  . Dizziness and weakness    Immunizations Administered    Name Date Dose VIS Date Route   Moderna COVID-19 Vaccine 02/24/2019  1:05 PM 0.5 mL 12/13/2018 Intramuscular   Manufacturer: Moderna   Lot: [id number]   [id number]

## 2019-03-06 LAB — HM DIABETES EYE EXAM

## 2019-03-07 ENCOUNTER — Ambulatory Visit (INDEPENDENT_AMBULATORY_CARE_PROVIDER_SITE_OTHER): Payer: Medicare Other

## 2019-03-07 ENCOUNTER — Other Ambulatory Visit: Payer: Self-pay

## 2019-03-07 VITALS — BP 122/76 | HR 76 | Temp 97.5°F | Resp 16 | Ht 65.0 in | Wt 162.9 lb

## 2019-03-07 DIAGNOSIS — Z0000 Encounter for general adult medical examination without abnormal findings: Secondary | ICD-10-CM
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
## 2019-03-07 NOTE — Patient Instructions (Signed)
[name] , Thank you for taking time to come for your Medicare Wellness Visit. I appreciate your ongoing commitment to your health goals. Please review the following plan we discussed and let me know if I can assist you in the future.   Screening recommendations/referrals: Colonoscopy: done 03/25/18 Recommended yearly ophthalmology/optometry visit for glaucoma screening and checkup Recommended yearly dental visit for hygiene and checkup  Vaccinations: Influenza vaccine: done 10/19/18 Pneumococcal vaccine: done 06/17/18 Tdap vaccine: done 11/22/09 Shingles vaccine: Shingrix discussed. Please contact your pharmacy for coverage information.   Advanced directives: Please bring a copy of your health care power of attorney and living will to the office at your convenience.  Conditions/risks identified: Recommend increasing physical activity  Next appointment: Please follow up in one year for your Medicare Annual Wellness visit.    Preventive Care 75 Years and Older, Male Preventive care refers to lifestyle choices and visits with your health care provider that can promote health and wellness. What does preventive care include?  A yearly physical exam. This is also called an annual well check.  Dental exams once or twice a year.  Routine eye exams. Ask your health care provider how often you should have your eyes checked.  Personal lifestyle choices, including:  Daily care of your teeth and gums.  Regular physical activity.  Eating a healthy diet.  Avoiding tobacco and drug use.  Limiting alcohol use.  Practicing safe sex.  Taking low doses of aspirin every day.  Taking vitamin and mineral supplements as recommended by your health care provider. What happens during an annual well check? The services and screenings done by your health care provider during your annual well check will depend on your age, overall health, lifestyle risk factors, and family history of  disease. Counseling  Your health care provider may ask you questions about your:  Alcohol use.  Tobacco use.  Drug use.  Emotional well-being.  Home and relationship well-being.  Sexual activity.  Eating habits.  History of falls.  Memory and ability to understand (cognition).  Work and work Statistician. Screening  You may have the following tests or measurements:  Height, weight, and BMI.  Blood pressure.  Lipid and cholesterol levels. These may be checked every 5 years, or more frequently if you are over 75 years old.  Skin check.  Lung cancer screening. You may have this screening every year starting at age 75 if you have a 30-pack-year history of smoking and currently smoke or have quit within the past 15 years.  Fecal occult blood test (FOBT) of the stool. You may have this test every year starting at age 75.  Flexible sigmoidoscopy or colonoscopy. You may have a sigmoidoscopy every 5 years or a colonoscopy every 10 years starting at age 75.  Prostate cancer screening. Recommendations will vary depending on your family history and other risks.  Hepatitis C blood test.  Hepatitis B blood test.  Sexually transmitted disease (STD) testing.  Diabetes screening. This is done by checking your blood sugar (glucose) after you have not eaten for a while (fasting). You may have this done every 1-3 years.  Abdominal aortic aneurysm (AAA) screening. You may need this if you are a current or former smoker.  Osteoporosis. You may be screened starting at age 75 if you are at high risk. Talk with your health care provider about your test results, treatment options, and if necessary, the need for more tests. Vaccines  Your health care provider may recommend certain vaccines,  such as:  Influenza vaccine. This is recommended every year.  Tetanus, diphtheria, and acellular pertussis (Tdap, Td) vaccine. You may need a Td booster every 10 years.  Zoster vaccine. You may  need this after age 75.  Pneumococcal 13-valent conjugate (PCV13) vaccine. One dose is recommended after age 25.  Pneumococcal polysaccharide (PPSV23) vaccine. One dose is recommended after age 52. Talk to your health care provider about which screenings and vaccines you need and how often you need them. This information is not intended to replace advice given to you by your health care provider. Make sure you discuss any questions you have with your health care provider. Document Released: 01/25/2015 Document Revised: 09/18/2015 Document Reviewed: 10/30/2014 Elsevier Interactive Patient Education  2017 [place] Prevention in the Home Falls can cause injuries. They can happen to people of all ages. There are many things you can do to make your home safe and to help prevent falls. What can I do on the outside of my home?  Regularly fix the edges of walkways and driveways and fix any cracks.  Remove anything that might make you trip as you walk through a door, such as a raised step or threshold.  Trim any bushes or trees on the path to your home.  Use bright outdoor lighting.  Clear any walking paths of anything that might make someone trip, such as rocks or tools.  Regularly check to see if handrails are loose or broken. Make sure that both sides of any steps have handrails.  Any raised decks and porches should have guardrails on the edges.  Have any leaves, snow, or ice cleared regularly.  Use sand or salt on walking paths during winter.  Clean up any spills in your garage right away. This includes oil or grease spills. What can I do in the bathroom?  Use night lights.  Install grab bars by the toilet and in the tub and shower. Do not use towel bars as grab bars.  Use non-skid mats or decals in the tub or shower.  If you need to sit down in the shower, use a plastic, non-slip stool.  Keep the floor dry. Clean up any water that spills on the floor as soon as it  happens.  Remove soap buildup in the tub or shower regularly.  Attach bath mats securely with double-sided non-slip rug tape.  Do not have throw rugs and other things on the floor that can make you trip. What can I do in the bedroom?  Use night lights.  Make sure that you have a light by your bed that is easy to reach.  Do not use any sheets or blankets that are too big for your bed. They should not hang down onto the floor.  Have a firm chair that has side arms. You can use this for support while you get dressed.  Do not have throw rugs and other things on the floor that can make you trip. What can I do in the kitchen?  Clean up any spills right away.  Avoid walking on wet floors.  Keep items that you use a lot in easy-to-reach places.  If you need to reach something above you, use a strong step stool that has a grab bar.  Keep electrical cords out of the way.  Do not use floor polish or wax that makes floors slippery. If you must use wax, use non-skid floor wax.  Do not have throw rugs and other things on  the floor that can make you trip. What can I do with my stairs?  Do not leave any items on the stairs.  Make sure that there are handrails on both sides of the stairs and use them. Fix handrails that are broken or loose. Make sure that handrails are as long as the stairways.  Check any carpeting to make sure that it is firmly attached to the stairs. Fix any carpet that is loose or worn.  Avoid having throw rugs at the top or bottom of the stairs. If you do have throw rugs, attach them to the floor with carpet tape.  Make sure that you have a light switch at the top of the stairs and the bottom of the stairs. If you do not have them, ask someone to add them for you. What else can I do to help prevent falls?  Wear shoes that:  Do not have high heels.  Have rubber bottoms.  Are comfortable and fit you well.  Are closed at the toe. Do not wear sandals.  If you  use a stepladder:  Make sure that it is fully opened. Do not climb a closed stepladder.  Make sure that both sides of the stepladder are locked into place.  Ask someone to hold it for you, if possible.  Clearly mark and make sure that you can see:  Any grab bars or handrails.  First and last steps.  Where the edge of each step is.  Use tools that help you move around (mobility aids) if they are needed. These include:  Canes.  Walkers.  Scooters.  Crutches.  Turn on the lights when you go into a dark area. Replace any light bulbs as soon as they burn out.  Set up your furniture so you have a clear path. Avoid moving your furniture around.  If any of your floors are uneven, fix them.  If there are any pets around you, be aware of where they are.  Review your medicines with your doctor. Some medicines can make you feel dizzy. This can increase your chance of falling. Ask your doctor what other things that you can do to help prevent falls. This information is not intended to replace advice given to you by your health care provider. Make sure you discuss any questions you have with your health care provider. Document Released: 10/25/2008 Document Revised: 06/06/2015 Document Reviewed: 02/02/2014 Elsevier Interactive Patient Education  2017 [employer].

## 2019-03-08 ENCOUNTER — Encounter: Payer: Self-pay | Admitting: Pharmacist

## 2019-03-08 ENCOUNTER — Ambulatory Visit: Payer: Self-pay | Admitting: Pharmacist

## 2019-03-08 NOTE — Telephone Encounter (Signed)
This encounter was created in error - please disregard.

## 2019-03-08 NOTE — Chronic Care Management (AMB) (Signed)
  Chronic Care Management   Note  03/08/2019 Name: Aaron Calhoun. MRN: [id number] DOB: 10-Apr-1945  74 y.o. year old male referred to Chronic Care Management by Clemetine Marker, LPN for medication assistance (Jardiance).   Was unable to reach patient via telephone today and voicemail was not set up. (unsuccessful outreach #1).  Follow up plan: The care management team will reach out to the patient again over the next 5-10 days.   Ruben Reason, PharmD Clinical Pharmacist [facility] [phone]

## 2019-03-15 ENCOUNTER — Ambulatory Visit: Payer: Self-pay | Admitting: Pharmacist

## 2019-03-24 NOTE — Chronic Care Management (AMB) (Signed)
  Chronic Care Management   Note  03/24/2019 Name: Aaron Calhoun. MRN: [id number] DOB: [birth year]  74 y.o. year old male referred to Chronic Care Management by Clemetine Marker, LPN for medication assistance (Jardiance).   Was unable to reach patient via telephone today and voicemail was not set up. (unsuccessful outreach #2).  Follow up plan: The care management team will reach out to the patient again over the next 5-10 days.   Ruben Reason, PharmD Clinical Pharmacist [facility] [phone]

## 2019-03-27 ENCOUNTER — Ambulatory Visit: Payer: Medicare Other | Attending: Internal Medicine

## 2019-03-27 DIAGNOSIS — Z23 Encounter for immunization: Secondary | ICD-10-CM

## 2019-03-27 NOTE — Progress Notes (Signed)
   [id number]  Name:  Aaron Calhoun.    MRN: [id number] DOB: 13-Jul-1945  03/27/2019  Mr. [name] was observed post Covid-19 immunization for 15 minutes without incident. He was provided with Vaccine Information Sheet and instruction to access the V-Safe system.   Mr. [name] was instructed to call 911 with any severe reactions post vaccine: [name] Difficulty breathing  . Swelling of face and throat  . A fast heartbeat  . A bad rash all over body  . Dizziness and weakness   Immunizations Administered    Name Date Dose VIS Date Route   Moderna COVID-19 Vaccine 03/27/2019 12:43 PM 0.5 mL 12/13/2018 Intramuscular   Manufacturer: Moderna   Lot: [id number]   [place] [id number]

## 2019-04-06 ENCOUNTER — Other Ambulatory Visit: Payer: Self-pay | Admitting: Family Medicine

## 2019-04-06 DIAGNOSIS — IMO0002 Reserved for concepts with insufficient information to code with codable children: Secondary | ICD-10-CM

## 2019-04-06 DIAGNOSIS — E1140 Type 2 diabetes mellitus with diabetic neuropathy, unspecified: Secondary | ICD-10-CM

## 2019-04-06 DIAGNOSIS — G8928 Other chronic postprocedural pain: Secondary | ICD-10-CM

## 2019-04-06 NOTE — Telephone Encounter (Signed)
Lab Results  Component Value Date   HGBA1C 7.0 (H) 01/23/2019

## 2019-04-10 ENCOUNTER — Ambulatory Visit: Payer: Medicare Other | Admitting: Podiatry

## 2019-04-13 ENCOUNTER — Other Ambulatory Visit: Payer: Self-pay

## 2019-04-13 ENCOUNTER — Ambulatory Visit: Payer: Medicare Other | Admitting: Podiatry

## 2019-04-13 ENCOUNTER — Encounter: Payer: Self-pay | Admitting: Podiatry

## 2019-04-13 VITALS — Temp 98.2°F

## 2019-04-13 DIAGNOSIS — B351 Tinea unguium: Secondary | ICD-10-CM | POA: Diagnosis not present

## 2019-04-13 DIAGNOSIS — M79675 Pain in left toe(s): Secondary | ICD-10-CM | POA: Diagnosis not present

## 2019-04-13 DIAGNOSIS — E1140 Type 2 diabetes mellitus with diabetic neuropathy, unspecified: Secondary | ICD-10-CM | POA: Diagnosis not present

## 2019-04-13 DIAGNOSIS — M79674 Pain in right toe(s): Secondary | ICD-10-CM

## 2019-04-13 NOTE — Progress Notes (Signed)
This patient returns to my office for at risk foot care.  This patient requires this care by a professional since this patient will be at risk due to having diabetes with europathy.This patient is unable to cut nails himself since the patient cannot reach his nails.These nails are painful walking and wearing shoes.  This patient presents for at risk foot care today.  General Appearance  Alert, conversant and in no acute stress.  Vascular  Dorsalis pedis and posterior tibial  pulses are palpable  bilaterally.  Capillary return is within normal limits  bilaterally. Temperature is within normal limits  bilaterally.  Neurologic  Senn-Weinstein monofilament wire test within normal limits  bilaterally. Muscle power within normal limits bilaterally.  Nails Thick disfigured discolored nails with subungual debris  from hallux to fifth toes bilaterally. No evidence of bacterial infection or drainage bilaterally.  Orthopedic  No limitations of motion  feet .  No crepitus or effusions noted.  No bony pathology or digital deformities noted.  Skin  normotropic skin with no porokeratosis noted bilaterally.  No signs of infections or ulcers noted.     Onychomycosis  Pain in right toes  Pain in left toes  Consent was obtained for treatment procedures.   Mechanical debridement of nails 1-5  bilaterally performed with a nail nipper.  Filed with dremel without incident.    Return office visit   3 months                   Told patient to return for periodic foot care and evaluation due to potential at risk complications.   Gardiner Barefoot DPM

## 2019-04-18 ENCOUNTER — Other Ambulatory Visit: Payer: Self-pay | Admitting: Family Medicine

## 2019-04-18 DIAGNOSIS — N401 Enlarged prostate with lower urinary tract symptoms: Secondary | ICD-10-CM

## 2019-04-18 DIAGNOSIS — R351 Nocturia: Secondary | ICD-10-CM

## 2019-05-23 ENCOUNTER — Ambulatory Visit (INDEPENDENT_AMBULATORY_CARE_PROVIDER_SITE_OTHER): Payer: Medicare Other | Admitting: Family Medicine

## 2019-05-23 ENCOUNTER — Other Ambulatory Visit: Payer: Self-pay

## 2019-05-23 ENCOUNTER — Encounter: Payer: Self-pay | Admitting: Family Medicine

## 2019-05-23 VITALS — BP 136/80 | HR 78 | Temp 97.8°F | Resp 16 | Ht 65.0 in | Wt 164.6 lb

## 2019-05-23 DIAGNOSIS — I10 Essential (primary) hypertension: Secondary | ICD-10-CM | POA: Diagnosis not present

## 2019-05-23 DIAGNOSIS — E1140 Type 2 diabetes mellitus with diabetic neuropathy, unspecified: Secondary | ICD-10-CM | POA: Diagnosis not present

## 2019-05-23 DIAGNOSIS — E7800 Pure hypercholesterolemia, unspecified: Secondary | ICD-10-CM

## 2019-05-23 DIAGNOSIS — M79674 Pain in right toe(s): Secondary | ICD-10-CM

## 2019-05-23 DIAGNOSIS — R351 Nocturia: Secondary | ICD-10-CM

## 2019-05-23 DIAGNOSIS — Z5181 Encounter for therapeutic drug level monitoring: Secondary | ICD-10-CM

## 2019-05-23 DIAGNOSIS — N529 Male erectile dysfunction, unspecified: Secondary | ICD-10-CM

## 2019-05-23 DIAGNOSIS — Z23 Encounter for immunization: Secondary | ICD-10-CM

## 2019-05-23 DIAGNOSIS — N401 Enlarged prostate with lower urinary tract symptoms: Secondary | ICD-10-CM

## 2019-05-23 DIAGNOSIS — M79675 Pain in left toe(s): Secondary | ICD-10-CM

## 2019-05-23 DIAGNOSIS — B351 Tinea unguium: Secondary | ICD-10-CM

## 2019-05-23 MED ORDER — ZOSTER VAC RECOMB ADJUVANTED 50 MCG/0.5ML IM SUSR: 0.5000 mL | Freq: Once | INTRAMUSCULAR | 1 refills | Status: AC

## 2019-05-23 MED ORDER — TAMSULOSIN HCL 0.4 MG PO CAPS: 0.8000 mg | ORAL_CAPSULE | Freq: Every day | ORAL | 3 refills | Status: DC

## 2019-05-23 MED ORDER — SILDENAFIL CITRATE 50 MG PO TABS: 50.0000 mg | ORAL_TABLET | Freq: Every day | ORAL | 5 refills | Status: DC | PRN

## 2019-05-23 MED ORDER — FINASTERIDE 5 MG PO TABS: 5.0000 mg | ORAL_TABLET | Freq: Every day | ORAL | 3 refills | Status: DC
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
## 2019-05-23 NOTE — Patient Instructions (Addendum)
Get labs done in August and we will do appointment after - either in office or virtual   Glimepiride meds - they are quick acting and lower your blood sugar Only take with food once in the morning if you blood sugar is > 100  If your blood sugar is lower than 100 or you are not eating - HOLD your glimepiride    Please contact me if you have any low blood sugar episodes  See hand out below   Blood Glucose Monitoring, Adult Monitoring your blood sugar (glucose) is an important part of managing your diabetes (diabetes mellitus). Blood glucose monitoring involves checking your blood glucose as often as directed and keeping a record (log) of your results over time. Checking your blood glucose regularly and keeping a blood glucose log can:  Help you and your health care provider adjust your diabetes management plan as needed, including your medicines or insulin.  Help you understand how food, exercise, illnesses, and medicines affect your blood glucose.  Let you know what your blood glucose is at any time. You can quickly find out if you have low blood glucose (hypoglycemia) or high blood glucose (hyperglycemia). Your health care provider will set individualized treatment goals for you. Your goals will be based on your age, other medical conditions you have, and how you respond to diabetes treatment. Generally, the goal of treatment is to maintain the following blood glucose levels:  Before meals (preprandial): 80-130 mg/dL (4.4-7.2 mmol/L).  After meals (postprandial): below 180 mg/dL (10 mmol/L).  A1c level: less than 7%. Supplies needed:  Blood glucose meter.  Test strips for your meter. Each meter has its own strips. You must use the strips that came with your meter.  A needle to prick your finger (lancet). Do not use a lancet more than one time.  A device that holds the lancet (lancing device).  A journal or log book to write down your results. How to check your blood  glucose  1. Wash your hands with soap and water. 2. Prick the side of your finger (not the tip) with the lancet. Use a different finger each time. 3. Gently rub the finger until a small drop of blood appears. 4. Follow instructions that come with your meter for inserting the test strip, applying blood to the strip, and using your blood glucose meter. 5. Write down your result and any notes. Some meters allow you to use areas of your body other than your finger (alternative sites) to test your blood. The most common alternative sites are:  Forearm.  Thigh.  Palm of the hand. If you think you may have hypoglycemia, or if you have a history of not knowing when your blood glucose is getting low (hypoglycemia unawareness), do not use alternative sites. Use your finger instead. Alternative sites may not be as accurate as the fingers, because blood flow is slower in these areas. This means that the result you get may be delayed, and it may be different from the result that you would get from your finger. Follow these instructions at home: Blood glucose log   Every time you check your blood glucose, write down your result. Also write down any notes about things that may be affecting your blood glucose, such as your diet and exercise for the day. This information can help you and your health care provider: ? Look for patterns in your blood glucose over time. ? Adjust your diabetes management plan as needed.  Check if your  meter allows you to download your records to a computer. Most glucose meters store a record of glucose readings in the meter. If you have type 1 diabetes:  Check your blood glucose 2 or more times a day.  Also check your blood glucose: ? Before every insulin injection. ? Before and after exercise. ? Before meals. ? 2 hours after a meal. ? Occasionally between 2:00 a.m. and 3:00 a.m., as directed. ? Before potentially dangerous tasks, like driving or using heavy  machinery. ? At bedtime.  You may need to check your blood glucose more often, up to 6-10 times a day, if you: ? Use an insulin pump. ? Need multiple daily injections (MDI). ? Have diabetes that is not well-controlled. ? Are ill. ? Have a history of severe hypoglycemia. ? Have hypoglycemia unawareness. If you have type 2 diabetes:  If you take insulin or other diabetes medicines, check your blood glucose 2 or more times a day.  If you are on intensive insulin therapy, check your blood glucose 4 or more times a day. Occasionally, you may also need to check between 2:00 a.m. and 3:00 a.m., as directed.  Also check your blood glucose: ? Before and after exercise. ? Before potentially dangerous tasks, like driving or using heavy machinery.  You may need to check your blood glucose more often if: ? Your medicine is being adjusted. ? Your diabetes is not well-controlled. ? You are ill. General tips  Always keep your supplies with you.  If you have questions or need help, all blood glucose meters have a 24-hour "hotline" phone number that you can call. You may also contact your health care provider.  After you use a few boxes of test strips, adjust (calibrate) your blood glucose meter by following instructions that came with your meter. Contact a health care provider if:  Your blood glucose is at or above 240 mg/dL (13.3 mmol/L) for 2 days in a row.  You have been sick or have had a fever for 2 days or longer, and you are not getting better.  You have any of the following problems for more than 6 hours: ? You cannot eat or drink. ? You have nausea or vomiting. ? You have diarrhea. Get help right away if:  Your blood glucose is lower than 54 mg/dL (3 mmol/L).  You become confused or you have trouble thinking clearly.  You have difficulty breathing.  You have moderate or large ketone levels in your urine. Summary  Monitoring your blood sugar (glucose) is an important part  of managing your diabetes (diabetes mellitus).  Blood glucose monitoring involves checking your blood glucose as often as directed and keeping a record (log) of your results over time.  Your health care provider will set individualized treatment goals for you. Your goals will be based on your age, other medical conditions you have, and how you respond to diabetes treatment.  Every time you check your blood glucose, write down your result. Also write down any notes about things that may be affecting your blood glucose, such as your diet and exercise for the day. This information is not intended to replace advice given to you by your health care provider. Make sure you discuss any questions you have with your health care provider. Document Revised: 10/22/2017 Document Reviewed: 06/10/2015 Elsevier Patient Education  [employer].     Hypoglycemia Hypoglycemia is when the sugar (glucose) level in your blood is too low. Signs of low blood sugar  may include:  Feeling: ? Hungry. ? Worried or nervous (anxious). ? Sweaty and clammy. ? Confused. ? Dizzy. ? Sleepy. ? Sick to your stomach (nauseous).  Having: ? A fast heartbeat. ? A headache. ? A change in your vision. ? Tingling or no feeling (numbness) around your mouth, lips, or tongue. ? Jerky movements that you cannot control (seizure).  Having trouble with: ? Moving (coordination). ? Sleeping. ? Passing out (fainting). ? Getting upset easily (irritability). Low blood sugar can happen to people who have diabetes and people who do not have diabetes. Low blood sugar can happen quickly, and it can be an emergency. Treating low blood sugar Low blood sugar is often treated by eating or drinking something sugary right away, such as:  Fruit juice, 4-6 oz (120-150 mL).  Regular soda (not diet soda), 4-6 oz (120-150 mL).  Low-fat milk, 4 oz (120 mL).  Several pieces of hard candy.  Sugar or honey, 1 Tbsp (15 mL). Treating  low blood sugar if you have diabetes If you can think clearly and swallow safely, follow the 15:15 rule:  Take 15 grams of a fast-acting carb (carbohydrate). Talk with your doctor about how much you should take.  Always keep a source of fast-acting carb with you, such as: ? Sugar tablets (glucose pills). Take 3-4 pills. ? 6-8 pieces of hard candy. ? 4-6 oz (120-150 mL) of fruit juice. ? 4-6 oz (120-150 mL) of regular (not diet) soda. ? 1 Tbsp (15 mL) honey or sugar.  Check your blood sugar 15 minutes after you take the carb.  If your blood sugar is still at or below 70 mg/dL (3.9 mmol/L), take 15 grams of a carb again.  If your blood sugar does not go above 70 mg/dL (3.9 mmol/L) after 3 tries, get help right away.  After your blood sugar goes back to normal, eat a meal or a snack within 1 hour.  Treating very low blood sugar If your blood sugar is at or below 54 mg/dL (3 mmol/L), you have very low blood sugar (severe hypoglycemia). This may also cause:  Passing out.  Jerky movements you cannot control (seizure).  Losing consciousness (coma). This is an emergency. Do not wait to see if the symptoms will go away. Get medical help right away. Call your local emergency services (911 in the [place] Do not drive yourself to the hospital. If you have very low blood sugar and you cannot eat or drink, you may need a glucagon shot (injection). A family member or friend should learn how to check your blood sugar and how to give you a glucagon shot. Ask your doctor if you need to have a glucagon shot kit at home. Follow these instructions at home: General instructions  Take over-the-counter and prescription medicines only as told by your doctor.  Stay aware of your blood sugar as told by your doctor.  Limit alcohol intake to no more than 1 drink a day for nonpregnant women and 2 drinks a day for men. One drink equals 12 oz of beer (355 mL), 5 oz of wine (148 mL), or 1 oz of hard liquor (44  mL).  Keep all follow-up visits as told by your doctor. This is important. If you have diabetes:   Follow your diabetes care plan as told by your doctor. Make sure you: ? Know the signs of low blood sugar. ? Take your medicines as told. ? Follow your exercise and meal plan. ? Eat on time. Do  not skip meals. ? Check your blood sugar as often as told by your doctor. Always check it before and after exercise. ? Follow your sick day plan when you cannot eat or drink normally. Make this plan ahead of time with your doctor.  Share your diabetes care plan with: ? Your work or school. ? People you live with.  Check your pee (urine) for ketones: ? When you are sick. ? As told by your doctor.  Carry a card or wear jewelry that says you have diabetes. Contact a doctor if:  You have trouble keeping your blood sugar in your target range.  You have low blood sugar often. Get help right away if:  You still have symptoms after you eat or drink something sugary.  Your blood sugar is at or below 54 mg/dL (3 mmol/L).  You have jerky movements that you cannot control.  You pass out. These symptoms may be an emergency. Do not wait to see if the symptoms will go away. Get medical help right away. Call your local emergency services (911 in the [place] Do not drive yourself to the hospital. Summary  Hypoglycemia happens when the level of sugar (glucose) in your blood is too low.  Low blood sugar can happen to people who have diabetes and people who do not have diabetes. Low blood sugar can happen quickly, and it can be an emergency.  Make sure you know the signs of low blood sugar and know how to treat it.  Always keep a source of sugar (fast-acting carb) with you to treat low blood sugar. This information is not intended to replace advice given to you by your health care provider. Make sure you discuss any questions you have with your health care provider. Document Revised: 04/21/2018  Document Reviewed: 02/01/2015 Elsevier Patient Education  2020 [employer].

## 2019-06-19 ENCOUNTER — Telehealth: Payer: Self-pay | Admitting: Family Medicine

## 2019-06-19 NOTE — Chronic Care Management (AMB) (Signed)
  Care Management   Note  06/19/2019 Name: Rebecca Motta. MRN: [id number] DOB: [birth year]  Ziyad Dyar. is a 74 y.o. year old male who is a primary care patient of Laurell Roof and is actively engaged with the care management team. I reached out to [name] by phone today to assist with scheduling an initial visit with the Pharmacist  Follow up plan: Telephone appointment with care management team member scheduled for:07/18/2019  Noreene Larsson, [address], [place], [place] [address] Direct Dial: [phone] [email or web address]

## 2019-07-13 ENCOUNTER — Ambulatory Visit: Payer: Medicare Other | Admitting: Podiatry

## 2019-07-18 ENCOUNTER — Other Ambulatory Visit: Payer: Self-pay

## 2019-07-18 ENCOUNTER — Ambulatory Visit: Payer: Medicare Other | Admitting: Pharmacist

## 2019-07-18 DIAGNOSIS — E7800 Pure hypercholesterolemia, unspecified: Secondary | ICD-10-CM

## 2019-07-18 DIAGNOSIS — E1140 Type 2 diabetes mellitus with diabetic neuropathy, unspecified: Secondary | ICD-10-CM

## 2019-07-18 NOTE — Chronic Care Management (AMB) (Signed)
Chronic Care Management Pharmacy  Name: Aaron Calhoun.  MRN: [id number] DOB: January 10, 1946  Chief Complaint/ HPI  Aaron Calhoun.,  74 y.o. , male presents for their Initial CCM visit with the clinical pharmacist via telephone due to COVID-19 Pandemic.  PCP : Delsa Grana, PA-C  Their chronic conditions include: DM, HLD  Office Visits: 5/11 DM, [place], BP 136/80 P 78 Wt 164.5 BM 27.4, FBG 130-135, A1c 7%, LDL 46  Consult Visit: NA  Medications: Outpatient Encounter Medications as of 07/18/2019  Medication Sig Note  . aspirin EC 81 MG tablet Take 81 mg by mouth daily.   . empagliflozin (JARDIANCE) 10 MG TABS tablet Take 10 mg by mouth daily.   . finasteride (PROSCAR) 5 MG tablet Take 1 tablet (5 mg total) by mouth daily.   [name] gabapentin (NEURONTIN) 300 MG capsule TAKE 1 CAPSULE BY MOUTH 3  TIMES DAILY   . glimepiride (AMARYL) 4 MG tablet TAKE 1 TABLET BY MOUTH  DAILY WITH BREAKFAST, HOLD  IF BLOOD SUGAR &lt;100 OR IF  NOT EATING   . glucose blood (ONE TOUCH ULTRA TEST) test strip USE TO TEST BLOOD SUGAR TWICE DAILY   . Lancets (ONETOUCH ULTRASOFT) lancets Test blood sugar 1x day Dx:E11.40, E11.9 LON:99   . losartan-hydrochlorothiazide (HYZAAR) 100-25 MG tablet Take 1 tablet by mouth daily. For blood pressure   . metFORMIN (GLUCOPHAGE) 1000 MG tablet TAKE 1 TABLET BY MOUTH  TWICE DAILY WITH A MEAL   . rosuvastatin (CRESTOR) 5 MG tablet Take 1 tablet (5 mg total) by mouth at bedtime.   . sildenafil (REVATIO) 20 MG tablet TAKE 1 TO 3 TABLETS BY MOUTH 30 MINUTES PRIOR TO INTERCOURSE, DO NOT EXCEED 5 TABLETS PER DAY   . sildenafil (VIAGRA) 50 MG tablet Take 1-2 tablets (50-100 mg total) by mouth daily as needed for erectile dysfunction (take 30 min before sexual activity).   . tamsulosin (FLOMAX) 0.4 MG CAPS capsule Take 2 capsules (0.8 mg total) by mouth daily.   [name] VITAMIN D PO Take by mouth daily. 03/07/2019: 2000 iu daily   No facility-administered encounter medications on file as of  07/18/2019.      Financial Resource Strain: Low Risk   . Difficulty of Paying Living Expenses: Not very hard    Current Diagnosis/Assessment:  Goals Addressed   None    Diabetes   Recent Relevant Labs: Lab Results  Component Value Date/Time   HGBA1C 7.0 (H) 01/23/2019 08:48 AM   HGBA1C 6.6 (H) 10/21/2018 12:00 AM   MICROALBUR <0.2 10/21/2018 12:00 AM   MICROALBUR 0.9 08/11/2017 09:33 AM   MICROALBUR NEG 07/19/2015 10:20 AM   MICROALBUR 20 07/03/2014 03:32 PM     Checking BG: Daily  Recent FBG Readings: NA Recent pre-meal BG readings: 110 - 160  Patient is currently uncontrolled on the following medications: metformin, Jardiance  Last diabetic Foot exam:  Lab Results  Component Value Date/Time   HMDIABEYEEXA No Retinopathy 03/06/2019 12:00 AM    Last diabetic Eye exam: No results found for: HMDIABFOOTEX   We discussed:  Near goal A1c No changes recommended  Plan  Increase Jardiance 73m daily  Hyperlipidemia   LDL goal < 70  Lipid Panel     Component Value Date/Time   CHOL 118 01/23/2019 0848   CHOL 140 07/19/2015 1016   TRIG 76 01/23/2019 0848   HDL 56 01/23/2019 0848   HDL 70 07/19/2015 1016   LDLCALC 46 01/23/2019 0848    Hepatic Function Latest  Ref Rng & Units 01/23/2019 10/21/2018 06/17/2018  Total Protein 6.1 - 8.1 g/dL 7.3 7.4 6.7  Albumin 3.6 - 5.1 g/dL - - -  AST 10 - 35 U/L _0 ALT 9 - 46 U/L _1 Alk Phosphatase 40 - 115 U/L - - -  Total Bilirubin 0.2 - 1.2 mg/dL 0.7 0.6 0.6     The ASCVD Risk score ([place]., et al., 2013) failed to calculate for the following reasons:   The valid total cholesterol range is 130 to 320 mg/dL   Patient has failed these meds in past: NA Patient is currently controlled on the following medications:  . Crestor 44m daily  We discussed:   At goal Denies myalgias  Plan  Continue current medications   Medication Management   [place]?  Yes Pt endorses 100% compliance Disadvantaged to [employer]  We discussed:  Has no paperwork for lab work Wants social security statement back  PWataugacurrent medication management strategy  Follow up: 3 month phone visit  TMilus Height PharmD, [phone]

## 2019-07-19 NOTE — Patient Instructions (Addendum)
Visit Information  Goals Addressed            This Visit's Progress   . Chronic Care Management       CARE PLAN ENTRY (see longitudinal plan of care for additional care plan information)  Current Barriers:  . Chronic Disease Management support, education, and care coordination needs related to Hyperlipidemia and Diabetes   Hypertension BP Readings from Last 3 Encounters:  05/23/19 136/80  03/07/19 122/76  01/23/19 132/70   . Pharmacist Clinical Goal(s): o Over the next 90 days, patient will work with PharmD and providers to maintain BP goal <130/80 . Current regimen:  o Losartan/hydrochlorothiazide 100/25mg  daily . Interventions: o None . Patient self care activities - Over the next 90 days, patient will: o Check BP weekly, document, and provide at future appointments o Ensure daily salt intake < 2300 mg/day  Hyperlipidemia Lab Results  Component Value Date/Time   LDLCALC 46 01/23/2019 08:48 AM   . Pharmacist Clinical Goal(s): o Over the next 90 days, patient will work with PharmD and providers to maintain LDL goal < 70 . Current regimen:  o Crestor 5mg  daily . Interventions: o None . Patient self care activities - Over the next 90 days, patient will: o Continue current medication adherence strategies  o Continue current lifestyle modifications  Diabetes Lab Results  Component Value Date/Time   HGBA1C 7.0 (H) 01/23/2019 08:48 AM   HGBA1C 6.6 (H) 10/21/2018 12:00 AM   . Pharmacist Clinical Goal(s): o Over the next 90 days, patient will work with PharmD and providers to achieve A1c goal <7% . Current regimen:  o Metformin 1000mg  twice daily with food o Jardiance 10mg  daily . Interventions: o Provider to consider increase to Jardiance 25mg  daily . Patient self care activities - Over the next 90 days, patient will: o Check blood sugar once daily, document, and provide at future appointments o Contact provider with any episodes of hypoglycemia  Medication  management . Pharmacist Clinical Goal(s): o Over the next 90 days, patient will work with PharmD and providers to achieve optimal medication adherence . Current pharmacy: [facility] . Interventions o Order Jardiance through patient assistance program o Patient has already provided financial information o Comprehensive medication review performed. o Continue current medication management strategy . Patient self care activities - Over the next 90 days, patient will: o Focus on medication adherence by working with PharmD to get Jardiance free of charge o Take medications as prescribed o Report any questions or concerns to PharmD and/or provider(s)  Initial goal documentation        Aaron Calhoun was given information about Chronic Care Management services today including:  1. CCM service includes personalized support from designated clinical staff supervised by his physician, including individualized plan of care and coordination with other care providers 2. 24/7 contact phone numbers for assistance for urgent and routine care needs. 3. Standard insurance, coinsurance, copays and deductibles apply for chronic care management only during months in which we provide at least 20 minutes of these services. Most insurances cover these services at 100%, however patients may be responsible for any copay, coinsurance and/or deductible if applicable. This service may help you avoid the need for more expensive face-to-face services. 4. Only one practitioner may furnish and bill the service in a calendar month. 5. The patient may stop CCM services at any time (effective at the end of the month) by phone call to the office staff.  Patient agreed to services and verbal consent obtained.  Print copy of patient instructions provided.  Face to Face appointment with pharmacist scheduled for:  3 months  Aaron Calhoun, PharmD, [place], [facility] [phone]  Diabetic  Neuropathy Diabetic neuropathy refers to nerve damage that is caused by diabetes (diabetes mellitus). Over time, people with diabetes can develop nerve damage throughout the body. There are several types of diabetic neuropathy:  Peripheral neuropathy. This is the most common type of diabetic neuropathy. It causes damage to nerves that carry signals between the spinal cord and other parts of the body (peripheral nerves). This usually affects nerves in the feet and legs first, and may eventually affect the hands and arms. The damage affects the ability to sense touch or temperature.  Autonomic neuropathy. This type causes damage to nerves that control involuntary functions (autonomic nerves). These nerves carry signals that control: ? Heartbeat. ? Body temperature. ? Blood pressure. ? Urination. ? Digestion. ? Sweating. ? Sexual function. ? Response to changing blood sugar (glucose) levels.  Focal neuropathy. This type of nerve damage affects one area of the body, such as an arm, a leg, or the face. The injury may involve one nerve or a small group of nerves. Focal neuropathy can be painful and unpredictable, and occurs most often in older adults with diabetes. This often develops suddenly, but usually improves over time and does not cause long-term problems.  Proximal neuropathy. This type of nerve damage affects the nerves of the thighs, hips, buttocks, or legs. It causes severe pain, weakness, and muscle death (atrophy), usually in the thigh muscles. It is more common among older men and people who have type 2 diabetes. The length of recovery time may vary. What are the causes? Peripheral, autonomic, and focal neuropathies are caused by diabetes that is not well controlled with treatment. The cause of proximal neuropathy is not known, but it may be caused by inflammation related to uncontrolled blood glucose levels. What are the signs or symptoms? Peripheral neuropathy Peripheral neuropathy  develops slowly over time. When the nerves of the feet and legs no longer work, you may experience:  Burning, stabbing, or aching pain in the legs or feet.  Pain or cramping in the legs or feet.  Loss of feeling (numbness) and inability to feel pressure or pain in the feet. This can lead to: ? Thick calluses or sores on areas of constant pressure. ? Ulcers. ? Reduced ability to feel temperature changes.  Foot deformities.  Muscle weakness.  Loss of balance or coordination. Autonomic neuropathy The symptoms of autonomic neuropathy vary depending on which nerves are affected. Symptoms may include:  Problems with digestion, such as: ? Nausea or vomiting. ? Poor appetite. ? Bloating. ? Diarrhea or constipation. ? Trouble swallowing. ? Losing weight without trying to.  Problems with the heart, blood and lungs, such as: ? Dizziness, especially when standing up. ? Fainting. ? Shortness of breath. ? Irregular heartbeat.  Bladder problems, such as: ? Trouble starting or stopping urination. ? Leaking urine. ? Trouble emptying the bladder. ? Urinary tract infections (UTIs).  Problems with other body functions, such as: ? Sweat. You may sweat too much or too little. ? Temperature. You might get hot easily. Or, you might feel cold more than usual. ? Sexual function. Men may not be able to get or maintain an erection. Women may have vaginal dryness and difficulty with arousal. Focal neuropathy Symptoms affect only one area of the body. Common symptoms include:  Numbness.  Tingling.  Burning  pain.  Prickling feeling.  Very sensitive skin.  Weakness.  Inability to move (paralysis).  Muscle twitching.  Muscles getting smaller (wasting).  Poor coordination.  Double or blurred vision. Proximal neuropathy  Sudden, severe pain in the hip, thigh, or buttocks. Pain may spread from the back into the legs (sciatica).  Pain and numbness in the arms and  legs.  Tingling.  Loss of bladder control or bowel control.  Weakness and wasting of thigh muscles.  Difficulty getting up from a seated position.  Abdominal swelling.  Unexplained weight loss. How is this diagnosed? Diagnosis usually involves reviewing your medical history and any symptoms you have. Diagnosis varies depending on the type of neuropathy your health care provider suspects. Peripheral neuropathy Your health care provider will check areas that are affected by your nervous system (neurologic exam), such as your reflexes, how you move, and what you can feel. You may have other tests, such as:  Blood tests.  Removal and examination of fluid that surrounds the spinal cord (lumbar puncture).  CT scan.  MRI.  A test to check the nerves that control muscles (electromyogram, EMG).  Tests of how quickly messages pass through your nerves (nerve conduction velocity tests).  Removal of a small piece of nerve to be examined under a microscope (biopsy). Autonomic neuropathy You may have tests, such as:  Tests to measure your blood pressure and heart rate. This may include monitoring you while you are safely secured to an exam table that moves you from a lying position to an upright position (table tilt test).  Breathing tests to check your lungs.  Tests to check how food moves through the digestive system (gastric emptying tests).  Blood, sweat, or urine tests.  Ultrasound of your bladder.  Spinal fluid tests. Focal neuropathy This condition may be diagnosed with:  A neurologic exam.  CT scan.  MRI.  EMG.  Nerve conduction velocity tests. Proximal neuropathy There is no test to diagnose this type of neuropathy. You may have tests to rule out other possible causes of this type of neuropathy. Tests may include:  X-rays of your spine and lumbar region.  Lumbar puncture.  MRI. How is this treated? The goal of treatment is to keep nerve damage from getting  worse. The most important part of treatment is keeping your blood glucose level and your A1C level within your target range by following your diabetes management plan. Over time, maintaining lower blood glucose levels helps lessen symptoms. In some cases, you may need prescription pain medicine. Follow these instructions at home:  Lifestyle   Do not use any products that contain nicotine or tobacco, such as cigarettes and e-cigarettes. If you need help quitting, ask your health care provider.  Be physically active every day. Include strength training and balance exercises.  Follow a healthy meal plan.  Work with your health care provider to manage your blood pressure. General instructions  Follow your diabetes management plan as directed. ? Check your blood glucose levels as directed by your health care provider. ? Keep your blood glucose in your target range as directed by your health care provider. ? Have your A1C level checked at least two times a year, or as often as told by your health care provider.  Take over the counter and prescription medicines only as told by your health care provider. This includes insulin and diabetes medicine.  Do not drive or use heavy machinery while taking prescription pain medicines.  Check your skin and feet  every day for cuts, bruises, redness, blisters, or sores.  Keep all follow up visits as told by your health care provider. This is important. Contact a health care provider if:  You have burning, stabbing, or aching pain in your legs or feet.  You are unable to feel pressure or pain in your feet.  You develop problems with digestion, such as: ? Nausea. ? Vomiting. ? Bloating. ? Constipation. ? Diarrhea. ? Abdominal pain.  You have difficulty with urination, such as inability: ? To control when you urinate (incontinence). ? To completely empty the bladder (retention).  You have palpitations.  You feel dizzy, weak, or faint when you  stand up. Get help right away if:  You cannot urinate.  You have sudden weakness or loss of coordination.  You have trouble speaking.  You have pain or pressure in your chest.  You have an irregular heart beat.  You have sudden inability to move a part of your body. Summary  Diabetic neuropathy refers to nerve damage that is caused by diabetes. It can affect nerves throughout the entire body, causing numbness and pain in the arms, legs, digestive tract, heart, and other body systems.  Keep your blood glucose level and your blood pressure in your target range, as directed by your health care provider. This can help prevent neuropathy from getting worse.  Check your skin and feet every day for cuts, bruises, redness, blisters, or sores.  Do not use any products that contain nicotine or tobacco, such as cigarettes and e-cigarettes. If you need help quitting, ask your health care provider. This information is not intended to replace advice given to you by your health care provider. Make sure you discuss any questions you have with your health care provider. Document Revised: 02/10/2017 Document Reviewed: 02/03/2016 Elsevier Patient Education  2020 [employer].

## 2019-08-12 ENCOUNTER — Other Ambulatory Visit: Payer: Self-pay

## 2019-08-12 ENCOUNTER — Emergency Department
Admission: EM | Admit: 2019-08-12 | Discharge: 2019-08-12 | Disposition: A | Discharge status: Home / Self Care | Payer: Medicare Other | Attending: Emergency Medicine | Admitting: Emergency Medicine

## 2019-08-12 DIAGNOSIS — Z7984 Long term (current) use of oral hypoglycemic drugs: Secondary | ICD-10-CM | POA: Insufficient documentation

## 2019-08-12 DIAGNOSIS — Z7982 Long term (current) use of aspirin: Secondary | ICD-10-CM | POA: Insufficient documentation

## 2019-08-12 DIAGNOSIS — R42 Dizziness and giddiness: Secondary | ICD-10-CM | POA: Insufficient documentation

## 2019-08-12 DIAGNOSIS — Z79899 Other long term (current) drug therapy: Secondary | ICD-10-CM | POA: Diagnosis not present

## 2019-08-12 DIAGNOSIS — I10 Essential (primary) hypertension: Secondary | ICD-10-CM | POA: Diagnosis not present

## 2019-08-12 DIAGNOSIS — E119 Type 2 diabetes mellitus without complications: Secondary | ICD-10-CM | POA: Insufficient documentation

## 2019-08-12 LAB — CBC
HCT: 43.9 % (ref 39.0–52.0)
Hemoglobin: 15 g/dL (ref 13.0–17.0)
MCH: 31.8 pg (ref 26.0–34.0)
MCHC: 34.2 g/dL (ref 30.0–36.0)
MCV: 93.2 fL (ref 80.0–100.0)
Platelets: 222 10*3/uL (ref 150–400)
RBC: 4.71 MIL/uL (ref 4.22–5.81)
RDW: 12.7 % (ref 11.5–15.5)
WBC: 6.4 10*3/uL (ref 4.0–10.5)
nRBC: 0 % (ref 0.0–0.2)

## 2019-08-12 LAB — URINALYSIS, COMPLETE (UACMP) WITH MICROSCOPIC
Bacteria, UA: NONE SEEN
Bilirubin Urine: NEGATIVE
Glucose, UA: NEGATIVE mg/dL
Hgb urine dipstick: NEGATIVE
Ketones, ur: NEGATIVE mg/dL
Leukocytes,Ua: NEGATIVE
Nitrite: NEGATIVE
Protein, ur: NEGATIVE mg/dL
Specific Gravity, Urine: 1.003 — ABNORMAL LOW (ref 1.005–1.030)
Squamous Epithelial / HPF: NONE SEEN (ref 0–5)
pH: 7 (ref 5.0–8.0)

## 2019-08-12 LAB — BASIC METABOLIC PANEL
Anion gap: 12 (ref 5–15)
BUN: 12 mg/dL (ref 8–23)
CO2: 26 mmol/L (ref 22–32)
Calcium: 10 mg/dL (ref 8.9–10.3)
Chloride: 102 mmol/L (ref 98–111)
Creatinine, Ser: 0.76 mg/dL (ref 0.61–1.24)
GFR calc Af Amer: >60 mL/min (ref >60)
GFR calc non Af Amer: >60 mL/min (ref >60)
Glucose, Bld: 125 mg/dL — ABNORMAL HIGH (ref 70–99)
Potassium: 3.9 mmol/L (ref 3.5–5.1)
Sodium: 140 mmol/L (ref 135–145)

## 2019-08-12 MED ORDER — SODIUM CHLORIDE 0.9% FLUSH: 3.0000 mL | Freq: Once | INTRAVENOUS | Status: DC

## 2019-08-12 NOTE — ED Notes (Signed)
Patient denies dizziness at this time.  

## 2019-08-12 NOTE — ED Notes (Signed)
Repeat VS obtained by this RN. Pt visualized in NAD at this time.  
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
## 2019-08-12 NOTE — ED Triage Notes (Signed)
PT arrives via POV for reports of dizziness upon awakening this morning. Pt reports after he experienced the dizziness he fell back on the bed but did not experience any kind of injury. Pt reports dizziness has subsided but he still feels lightheaded. PT with clear speech. Skin warm and dry, NAD

## 2019-08-12 NOTE — ED Notes (Signed)
Pt updated on wait time. Pt verbalized understanding, family with patient while updated.

## 2019-08-12 NOTE — Discharge Instructions (Addendum)
Please seek medical attention for any high fevers, chest pain, shortness of breath, change in behavior, persistent vomiting, bloody stool or any other new or concerning symptoms.  
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
## 2019-08-16 ENCOUNTER — Other Ambulatory Visit: Payer: Self-pay

## 2019-08-16 ENCOUNTER — Encounter: Payer: Self-pay | Admitting: Internal Medicine

## 2019-08-16 ENCOUNTER — Ambulatory Visit (INDEPENDENT_AMBULATORY_CARE_PROVIDER_SITE_OTHER): Payer: Medicare Other | Admitting: Internal Medicine

## 2019-08-16 VITALS — BP 132/82 | HR 92 | Temp 97.9°F | Resp 16 | Ht 65.0 in | Wt 166.3 lb

## 2019-08-16 DIAGNOSIS — E1140 Type 2 diabetes mellitus with diabetic neuropathy, unspecified: Secondary | ICD-10-CM

## 2019-08-16 DIAGNOSIS — Z09 Encounter for follow-up examination after completed treatment for conditions other than malignant neoplasm: Secondary | ICD-10-CM | POA: Diagnosis not present

## 2019-08-16 DIAGNOSIS — R42 Dizziness and giddiness: Secondary | ICD-10-CM

## 2019-08-16 HISTORY — DX: Dizziness and giddiness: R42

## 2019-08-16 MED ORDER — MECLIZINE HCL 12.5 MG PO TABS: 12.5000 mg | ORAL_TABLET | Freq: Three times a day (TID) | ORAL | 0 refills | Status: DC | PRN

## 2019-08-16 NOTE — Patient Instructions (Signed)
Referral placed today to ENT

## 2019-08-23 ENCOUNTER — Other Ambulatory Visit: Payer: Self-pay

## 2019-08-23 ENCOUNTER — Encounter: Payer: Self-pay | Admitting: Family Medicine

## 2019-08-23 ENCOUNTER — Ambulatory Visit (INDEPENDENT_AMBULATORY_CARE_PROVIDER_SITE_OTHER): Payer: Medicare Other | Admitting: Family Medicine

## 2019-08-23 VITALS — BP 124/82 | HR 81 | Temp 98.0°F | Resp 18 | Ht 65.0 in | Wt 167.6 lb

## 2019-08-23 DIAGNOSIS — E1140 Type 2 diabetes mellitus with diabetic neuropathy, unspecified: Secondary | ICD-10-CM | POA: Diagnosis not present

## 2019-08-23 DIAGNOSIS — E7800 Pure hypercholesterolemia, unspecified: Secondary | ICD-10-CM | POA: Diagnosis not present

## 2019-08-23 DIAGNOSIS — I10 Essential (primary) hypertension: Secondary | ICD-10-CM

## 2019-08-23 DIAGNOSIS — Z5181 Encounter for therapeutic drug level monitoring: Secondary | ICD-10-CM

## 2019-08-23 DIAGNOSIS — N401 Enlarged prostate with lower urinary tract symptoms: Secondary | ICD-10-CM

## 2019-08-23 DIAGNOSIS — R351 Nocturia: Secondary | ICD-10-CM

## 2019-08-23 MED ORDER — LOSARTAN POTASSIUM-HCTZ 100-25 MG PO TABS: 1.0000 | ORAL_TABLET | Freq: Every day | ORAL | 3 refills | Status: DC

## 2019-08-23 MED ORDER — EMPAGLIFLOZIN 10 MG PO TABS: 10.0000 mg | ORAL_TABLET | Freq: Every day | ORAL | 11 refills | Status: DC

## 2019-08-23 MED ORDER — ROSUVASTATIN CALCIUM 5 MG PO TABS: 5.0000 mg | ORAL_TABLET | Freq: Every day | ORAL | 3 refills | Status: DC
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
## 2019-08-24 LAB — COMPLETE METABOLIC PANEL WITH GFR
AG Ratio: 1.8 (calc) (ref 1.0–2.5)
ALT: 29 U/L (ref 9–46)
AST: 17 U/L (ref 10–35)
Albumin: 4.6 g/dL (ref 3.6–5.1)
Alkaline phosphatase (APISO): 59 U/L (ref 35–144)
BUN: 14 mg/dL (ref 7–25)
CO2: 25 mmol/L (ref 20–32)
Calcium: 9.4 mg/dL (ref 8.6–10.3)
Chloride: 101 mmol/L (ref 98–110)
Creat: 0.81 mg/dL (ref 0.70–1.18)
GFR, Est African American: 101 mL/min/{1.73_m2} (ref >=60)
GFR, Est Non African American: 88 mL/min/{1.73_m2} (ref >=60)
Globulin: 2.5 g/dL (calc) (ref 1.9–3.7)
Glucose, Bld: 187 mg/dL — ABNORMAL HIGH (ref 65–99)
Potassium: 3.9 mmol/L (ref 3.5–5.3)
Sodium: 138 mmol/L (ref 135–146)
Total Bilirubin: 0.5 mg/dL (ref 0.2–1.2)
Total Protein: 7.1 g/dL (ref 6.1–8.1)

## 2019-08-24 LAB — HEMOGLOBIN A1C
Hgb A1c MFr Bld: 7.6 % of total Hgb — ABNORMAL HIGH (ref <5.7)
Mean Plasma Glucose: 171 (calc)
eAG (mmol/L): 9.5 (calc)

## 2019-08-29 ENCOUNTER — Other Ambulatory Visit: Payer: Self-pay | Admitting: Physician Assistant

## 2019-08-29 DIAGNOSIS — IMO0001 Reserved for inherently not codable concepts without codable children: Secondary | ICD-10-CM

## 2019-08-29 DIAGNOSIS — H9042 Sensorineural hearing loss, unilateral, left ear, with unrestricted hearing on the contralateral side: Secondary | ICD-10-CM

## 2019-09-12 ENCOUNTER — Encounter: Payer: Self-pay | Admitting: Family Medicine

## 2019-09-14 ENCOUNTER — Other Ambulatory Visit: Payer: Self-pay

## 2019-09-14 ENCOUNTER — Ambulatory Visit
Admission: RE | Admit: 2019-09-14 | Discharge: 2019-09-14 | Disposition: A | Discharge status: Home / Self Care | Payer: Medicare Other | Source: Ambulatory Visit | Attending: Physician Assistant | Admitting: Physician Assistant

## 2019-09-14 DIAGNOSIS — H9042 Sensorineural hearing loss, unilateral, left ear, with unrestricted hearing on the contralateral side: Secondary | ICD-10-CM | POA: Diagnosis not present

## 2019-09-14 DIAGNOSIS — IMO0001 Reserved for inherently not codable concepts without codable children: Secondary | ICD-10-CM

## 2019-09-14 MED ORDER — GADOBUTROL 1 MMOL/ML IV SOLN
7.5000 mL | Freq: Once | INTRAVENOUS | Status: AC | PRN
  Administered 2019-09-14: 7.5 mL via INTRAVENOUS

## 2019-09-25 ENCOUNTER — Encounter: Payer: Self-pay | Admitting: Family Medicine

## 2019-10-24 ENCOUNTER — Other Ambulatory Visit: Payer: Self-pay

## 2019-10-24 ENCOUNTER — Ambulatory Visit: Payer: Medicare Other | Admitting: Pharmacist

## 2019-10-24 DIAGNOSIS — E1140 Type 2 diabetes mellitus with diabetic neuropathy, unspecified: Secondary | ICD-10-CM

## 2019-10-24 DIAGNOSIS — E7800 Pure hypercholesterolemia, unspecified: Secondary | ICD-10-CM

## 2019-10-24 NOTE — Chronic Care Management (AMB) (Signed)
Chronic Care Management Pharmacy  Name: Aaron Calhoun.  MRN: [id number] DOB: 04/23/1945  Chief Complaint/ HPI  Aaron Calhoun.,  74 y.o. , male presents for their Follow-Up CCM visit with the clinical pharmacist In office.  PCP : Delsa Grana, PA-C  Their chronic conditions include: HTN, HLD, DM  Office Visits:NA  Consult Visit:NA  Medications: Outpatient Encounter Medications as of 10/24/2019  Medication Sig  . aspirin EC 81 MG tablet Take 81 mg by mouth daily.  . empagliflozin (JARDIANCE) 10 MG TABS tablet Take 1 tablet (10 mg total) by mouth daily.  . finasteride (PROSCAR) 5 MG tablet Take 1 tablet (5 mg total) by mouth daily.  [name] gabapentin (NEURONTIN) 300 MG capsule TAKE 1 CAPSULE BY MOUTH 3  TIMES DAILY  . glimepiride (AMARYL) 4 MG tablet TAKE 1 TABLET BY MOUTH  DAILY WITH BREAKFAST, HOLD  IF BLOOD SUGAR <100 OR IF  NOT EATING  . glucose blood (ONE TOUCH ULTRA TEST) test strip USE TO TEST BLOOD SUGAR TWICE DAILY  . Lancets (ONETOUCH ULTRASOFT) lancets Test blood sugar 1x day Dx:E11.40, E11.9 LON:99  . losartan-hydrochlorothiazide (HYZAAR) 100-25 MG tablet Take 1 tablet by mouth daily. For blood pressure  . meclizine (ANTIVERT) 12.5 MG tablet Take 1 tablet (12.5 mg total) by mouth 3 (three) times daily as needed for dizziness. May make drowsy, not drive after taking  . metFORMIN (GLUCOPHAGE) 1000 MG tablet TAKE 1 TABLET BY MOUTH  TWICE DAILY WITH A MEAL  . rosuvastatin (CRESTOR) 5 MG tablet Take 1 tablet (5 mg total) by mouth at bedtime.  . sildenafil (VIAGRA) 50 MG tablet Take 1-2 tablets (50-100 mg total) by mouth daily as needed for erectile dysfunction (take 30 min before sexual activity).  [name] VITAMIN D PO Take 1,000 Units by mouth daily.    No facility-administered encounter medications on file as of 10/24/2019.     Financial Resource Strain: Low Risk   . Difficulty of Paying Living Expenses: Not very hard     Current Diagnosis/Assessment:  Goals Addressed             This Visit's Progress   . Chronic Care Management       CARE PLAN ENTRY (see longitudinal plan of care for additional care plan information)  Current Barriers:  . Chronic Disease Management support, education, and care coordination needs related to Hyperlipidemia and Diabetes   Hypertension BP Readings from Last 3 Encounters:  05/23/19 136/80  03/07/19 122/76  01/23/19 132/70   . Pharmacist Clinical Goal(s): o Over the next 90 days, patient will work with PharmD and providers to maintain BP goal <130/80 . Current regimen:  o Losartan/hydrochlorothiazide 100/75m daily . Interventions: o None . Patient self care activities - Over the next 90 days, patient will: o Check BP weekly, document, and provide at future appointments o Ensure daily salt intake < 2300 mg/day  Hyperlipidemia Lab Results  Component Value Date/Time   LDLCALC 46 01/23/2019 08:48 AM   . Pharmacist Clinical Goal(s): o Over the next 90 days, patient will work with PharmD and providers to maintain LDL goal < 70 . Current regimen:  o Crestor 576mdaily . Interventions: o None . Patient self care activities - Over the next 90 days, patient will: o Continue current medication adherence strategies  o Continue current lifestyle modifications  Diabetes Lab Results  Component Value Date/Time   HGBA1C 7.0 (H) 01/23/2019 08:48 AM   HGBA1C 6.6 (H) 10/21/2018 12:00 AM   . Pharmacist  Clinical Goal(s): o Over the next 90 days, patient will work with PharmD and providers to achieve A1c goal <7% . Current regimen:  o Metformin 1082m twice daily with food o Glimepiride 42mdaily . Interventions: o Provider to consider increase to Jardiance 2549maily . Patient self care activities - Over the next 90 days, patient will: o Check blood sugar once daily, document, and provide at future appointments o Contact provider with any episodes of hypoglycemia  Medication management . Pharmacist Clinical  Goal(s): o Over the next 90 days, patient will work with PharmD and providers to achieve optimal medication adherence . Current pharmacy: [facility] . Interventions o Ordered Jardiance through patient assistance program o Patient has already provided financial information o Comprehensive medication review performed. o Continue current medication management strategy . Patient self care activities - Over the next 90 days, patient will: o Focus on medication adherence by working with PharmD to get Jar[place]ulee of charge o Take medications as prescribed o Report any questions or concerns to PharmD and/or provider(s)  Initial goal documentation       Diabetes   Recent Relevant Labs: Lab Results  Component Value Date/Time   HGBA1C 7.6 (H) 08/23/2019 09:07 AM   HGBA1C 7.0 (H) 01/23/2019 08:48 AM   MICROALBUR <0.2 10/21/2018 12:00 AM   MICROALBUR 0.9 08/11/2017 09:33 AM   MICROALBUR NEG 07/19/2015 10:20 AM   MICROALBUR 20 07/03/2014 03:32 PM     Checking BG: Rarely  Patient is currently uncontrolled on the following medications: glimepiride 4mg42mily, metformin 1000mg63m ac  Last diabetic Foot exam:  Lab Results  Component Value Date/Time   HMDIABEYEEXA No Retinopathy 03/06/2019 12:00 AM    Last diabetic Eye exam: No results found for: HMDIABFOOTEX   We discussed:  Not at goal Denies hypoglycemia Can't afford Jardiance even though insurance is paying and 3 month supply about $140.  Plan  Recommend restart/increase Jardiance 25mg 14my once received  Hyperlipidemia   LDL goal < 70  Lipid Panel     Component Value Date/Time   CHOL 118 01/23/2019 0848   CHOL 140 07/19/2015 1016   TRIG 76 01/23/2019 0848   HDL 56 01/23/2019 0848   HDL 70 07/19/2015 1016   LDLCALC 46 01/23/2019 0848    Hepatic Function Latest Ref Rng & Units 08/23/2019 01/23/2019 10/21/2018  Total Protein 6.1 - 8.1 g/dL 7.1 7.3 7.4  Albumin 3.6 - 5.1 g/dL - - -  AST 10 - 35 U/L '17 16 19  ' ALT 9 - 46  U/L '29 21 27  ' Alk Phosphatase 40 - 115 U/L - - -  Total Bilirubin 0.2 - 1.2 mg/dL 0.5 0.7 0.6     The ASCVD Risk score ([place]., 2013) failed to calculate for the following reasons:   The valid total cholesterol range is 130 to 320 mg/dL   Patient has failed these meds in past: NA Patient is currently controlled on the following medications:  . Crestor 5mg qh4mWe discussed:  At goal Denies myalgias  Plan  Continue current medications  Medication Management   We discussed:  Patient [place]  Follow up: 3 month phone visit  Lyndell Gillyard HanMilus HeightD, BCGP, CJupiter Inlet ColonyClWaltham Medical [phone]

## 2019-10-26 NOTE — Patient Instructions (Addendum)
Visit Information  Goals Addressed            This Visit's Progress   . Chronic Care Management       CARE PLAN ENTRY (see longitudinal plan of care for additional care plan information)  Current Barriers:  . Chronic Disease Management support, education, and care coordination needs related to Hyperlipidemia and Diabetes   Hypertension BP Readings from Last 3 Encounters:  05/23/19 136/80  03/07/19 122/76  01/23/19 132/70   . Pharmacist Clinical Goal(s): o Over the next 90 days, patient will work with PharmD and providers to maintain BP goal <130/80 . Current regimen:  o Losartan/hydrochlorothiazide 100/25mg  daily . Interventions: o None . Patient self care activities - Over the next 90 days, patient will: o Check BP weekly, document, and provide at future appointments o Ensure daily salt intake < 2300 mg/day  Hyperlipidemia Lab Results  Component Value Date/Time   LDLCALC 46 01/23/2019 08:48 AM   . Pharmacist Clinical Goal(s): o Over the next 90 days, patient will work with PharmD and providers to maintain LDL goal < 70 . Current regimen:  o Crestor 5mg  daily . Interventions: o None . Patient self care activities - Over the next 90 days, patient will: o Continue current medication adherence strategies  o Continue current lifestyle modifications  Diabetes Lab Results  Component Value Date/Time   HGBA1C 7.0 (H) 01/23/2019 08:48 AM   HGBA1C 6.6 (H) 10/21/2018 12:00 AM   . Pharmacist Clinical Goal(s): o Over the next 90 days, patient will work with PharmD and providers to achieve A1c goal <7% . Current regimen:  o Metformin 1000mg  twice daily with food o Glimepiride 4mg  daily . Interventions: o Provider to consider increase to Jardiance 25mg  daily . Patient self care activities - Over the next 90 days, patient will: o Check blood sugar once daily, document, and provide at future appointments o Contact provider with any episodes of hypoglycemia  Medication  management . Pharmacist Clinical Goal(s): o Over the next 90 days, patient will work with PharmD and providers to achieve optimal medication adherence . Current pharmacy: [facility] . Interventions o Ordered Jardiance through patient assistance program o Patient has already provided financial information o Comprehensive medication review performed. o Continue current medication management strategy . Patient self care activities - Over the next 90 days, patient will: o Focus on medication adherence by working with PharmD to get Jardiance free of charge o Take medications as prescribed o Report any questions or concerns to PharmD and/or provider(s)  Initial goal documentation        Print copy of patient instructions provided.   Telephone follow up appointment with pharmacy team member scheduled for: 3 months  Milus Height, PharmD, [place], [facility] [phone]  Diabetic Neuropathy Diabetic neuropathy refers to nerve damage that is caused by diabetes (diabetes mellitus). Over time, people with diabetes can develop nerve damage throughout the body. There are several types of diabetic neuropathy:  Peripheral neuropathy. This is the most common type of diabetic neuropathy. It causes damage to nerves that carry signals between the spinal cord and other parts of the body (peripheral nerves). This usually affects nerves in the feet and legs first, and may eventually affect the hands and arms. The damage affects the ability to sense touch or temperature.  Autonomic neuropathy. This type causes damage to nerves that control involuntary functions (autonomic nerves). These nerves carry signals that control: ? Heartbeat. ? Body temperature. ? Blood pressure. ?  Urination. ? Digestion. ? Sweating. ? Sexual function. ? Response to changing blood sugar (glucose) levels.  Focal neuropathy. This type of nerve damage affects one area of the body, such as an  arm, a leg, or the face. The injury may involve one nerve or a small group of nerves. Focal neuropathy can be painful and unpredictable, and occurs most often in older adults with diabetes. This often develops suddenly, but usually improves over time and does not cause long-term problems.  Proximal neuropathy. This type of nerve damage affects the nerves of the thighs, hips, buttocks, or legs. It causes severe pain, weakness, and muscle death (atrophy), usually in the thigh muscles. It is more common among older men and people who have type 2 diabetes. The length of recovery time may vary. What are the causes? Peripheral, autonomic, and focal neuropathies are caused by diabetes that is not well controlled with treatment. The cause of proximal neuropathy is not known, but it may be caused by inflammation related to uncontrolled blood glucose levels. What are the signs or symptoms? Peripheral neuropathy Peripheral neuropathy develops slowly over time. When the nerves of the feet and legs no longer work, you may experience:  Burning, stabbing, or aching pain in the legs or feet.  Pain or cramping in the legs or feet.  Loss of feeling (numbness) and inability to feel pressure or pain in the feet. This can lead to: ? Thick calluses or sores on areas of constant pressure. ? Ulcers. ? Reduced ability to feel temperature changes.  Foot deformities.  Muscle weakness.  Loss of balance or coordination. Autonomic neuropathy The symptoms of autonomic neuropathy vary depending on which nerves are affected. Symptoms may include:  Problems with digestion, such as: ? Nausea or vomiting. ? Poor appetite. ? Bloating. ? Diarrhea or constipation. ? Trouble swallowing. ? Losing weight without trying to.  Problems with the heart, blood and lungs, such as: ? Dizziness, especially when standing up. ? Fainting. ? Shortness of breath. ? Irregular heartbeat.  Bladder problems, such as: ? Trouble  starting or stopping urination. ? Leaking urine. ? Trouble emptying the bladder. ? Urinary tract infections (UTIs).  Problems with other body functions, such as: ? Sweat. You may sweat too much or too little. ? Temperature. You might get hot easily. Or, you might feel cold more than usual. ? Sexual function. Men may not be able to get or maintain an erection. Women may have vaginal dryness and difficulty with arousal. Focal neuropathy Symptoms affect only one area of the body. Common symptoms include:  Numbness.  Tingling.  Burning pain.  Prickling feeling.  Very sensitive skin.  Weakness.  Inability to move (paralysis).  Muscle twitching.  Muscles getting smaller (wasting).  Poor coordination.  Double or blurred vision. Proximal neuropathy  Sudden, severe pain in the hip, thigh, or buttocks. Pain may spread from the back into the legs (sciatica).  Pain and numbness in the arms and legs.  Tingling.  Loss of bladder control or bowel control.  Weakness and wasting of thigh muscles.  Difficulty getting up from a seated position.  Abdominal swelling.  Unexplained weight loss. How is this diagnosed? Diagnosis usually involves reviewing your medical history and any symptoms you have. Diagnosis varies depending on the type of neuropathy your health care provider suspects. Peripheral neuropathy Your health care provider will check areas that are affected by your nervous system (neurologic exam), such as your reflexes, how you move, and what you can feel. You may  have other tests, such as:  Blood tests.  Removal and examination of fluid that surrounds the spinal cord (lumbar puncture).  CT scan.  MRI.  A test to check the nerves that control muscles (electromyogram, EMG).  Tests of how quickly messages pass through your nerves (nerve conduction velocity tests).  Removal of a small piece of nerve to be examined under a microscope (biopsy). Autonomic  neuropathy You may have tests, such as:  Tests to measure your blood pressure and heart rate. This may include monitoring you while you are safely secured to an exam table that moves you from a lying position to an upright position (table tilt test).  Breathing tests to check your lungs.  Tests to check how food moves through the digestive system (gastric emptying tests).  Blood, sweat, or urine tests.  Ultrasound of your bladder.  Spinal fluid tests. Focal neuropathy This condition may be diagnosed with:  A neurologic exam.  CT scan.  MRI.  EMG.  Nerve conduction velocity tests. Proximal neuropathy There is no test to diagnose this type of neuropathy. You may have tests to rule out other possible causes of this type of neuropathy. Tests may include:  X-rays of your spine and lumbar region.  Lumbar puncture.  MRI. How is this treated? The goal of treatment is to keep nerve damage from getting worse. The most important part of treatment is keeping your blood glucose level and your A1C level within your target range by following your diabetes management plan. Over time, maintaining lower blood glucose levels helps lessen symptoms. In some cases, you may need prescription pain medicine. Follow these instructions at home:  Lifestyle   Do not use any products that contain nicotine or tobacco, such as cigarettes and e-cigarettes. If you need help quitting, ask your health care provider.  Be physically active every day. Include strength training and balance exercises.  Follow a healthy meal plan.  Work with your health care provider to manage your blood pressure. General instructions  Follow your diabetes management plan as directed. ? Check your blood glucose levels as directed by your health care provider. ? Keep your blood glucose in your target range as directed by your health care provider. ? Have your A1C level checked at least two times a year, or as often as told  by your health care provider.  Take over the counter and prescription medicines only as told by your health care provider. This includes insulin and diabetes medicine.  Do not drive or use heavy machinery while taking prescription pain medicines.  Check your skin and feet every day for cuts, bruises, redness, blisters, or sores.  Keep all follow up visits as told by your health care provider. This is important. Contact a health care provider if:  You have burning, stabbing, or aching pain in your legs or feet.  You are unable to feel pressure or pain in your feet.  You develop problems with digestion, such as: ? Nausea. ? Vomiting. ? Bloating. ? Constipation. ? Diarrhea. ? Abdominal pain.  You have difficulty with urination, such as inability: ? To control when you urinate (incontinence). ? To completely empty the bladder (retention).  You have palpitations.  You feel dizzy, weak, or faint when you stand up. Get help right away if:  You cannot urinate.  You have sudden weakness or loss of coordination.  You have trouble speaking.  You have pain or pressure in your chest.  You have an irregular heart beat.  You have sudden inability to move a part of your body. Summary  Diabetic neuropathy refers to nerve damage that is caused by diabetes. It can affect nerves throughout the entire body, causing numbness and pain in the arms, legs, digestive tract, heart, and other body systems.  Keep your blood glucose level and your blood pressure in your target range, as directed by your health care provider. This can help prevent neuropathy from getting worse.  Check your skin and feet every day for cuts, bruises, redness, blisters, or sores.  Do not use any products that contain nicotine or tobacco, such as cigarettes and e-cigarettes. If you need help quitting, ask your health care provider. This information is not intended to replace advice given to you by your health care  provider. Make sure you discuss any questions you have with your health care provider. Document Revised: 02/10/2017 Document Reviewed: 02/03/2016 Elsevier Patient Education  2020 [employer].

## 2019-12-04 ENCOUNTER — Telehealth: Payer: Self-pay

## 2019-12-04 NOTE — Progress Notes (Signed)
    Chronic Care Management Pharmacy Assistant   Name: Aaron Calhoun.  MRN: [id number] DOB: 09/10/1945  Reason for Encounter: Medication Management  Patient Questions:  1.  Have you seen any other providers since your last visit? No  2.  Any changes in your medicines or health? No   Aaron Calhoun.,  74 y.o. , male presents for their Follow-Up CCM visit with the clinical pharmacist via telephone.  PCP : Delsa Grana, PA-C  Allergies:  No Known Allergies  Medications: Outpatient Encounter Medications as of 12/04/2019  Medication Sig  . aspirin EC 81 MG tablet Take 81 mg by mouth daily.  . empagliflozin (JARDIANCE) 10 MG TABS tablet Take 1 tablet (10 mg total) by mouth daily.  . finasteride (PROSCAR) 5 MG tablet Take 1 tablet (5 mg total) by mouth daily.  [name] gabapentin (NEURONTIN) 300 MG capsule TAKE 1 CAPSULE BY MOUTH 3  TIMES DAILY  . glimepiride (AMARYL) 4 MG tablet TAKE 1 TABLET BY MOUTH  DAILY WITH BREAKFAST, HOLD  IF BLOOD SUGAR &lt;100 OR IF  NOT EATING  . glucose blood (ONE TOUCH ULTRA TEST) test strip USE TO TEST BLOOD SUGAR TWICE DAILY  . Lancets (ONETOUCH ULTRASOFT) lancets Test blood sugar 1x day Dx:E11.40, E11.9 LON:99  . losartan-hydrochlorothiazide (HYZAAR) 100-25 MG tablet Take 1 tablet by mouth daily. For blood pressure  . meclizine (ANTIVERT) 12.5 MG tablet Take 1 tablet (12.5 mg total) by mouth 3 (three) times daily as needed for dizziness. May make drowsy, not drive after taking  . metFORMIN (GLUCOPHAGE) 1000 MG tablet TAKE 1 TABLET BY MOUTH  TWICE DAILY WITH A MEAL  . rosuvastatin (CRESTOR) 5 MG tablet Take 1 tablet (5 mg total) by mouth at bedtime.  . sildenafil (VIAGRA) 50 MG tablet Take 1-2 tablets (50-100 mg total) by mouth daily as needed for erectile dysfunction (take 30 min before sexual activity).  [name] VITAMIN D PO Take 1,000 Units by mouth daily.    No facility-administered encounter medications on file as of 12/04/2019.    Current  Diagnosis: Patient Assistance Coordination   Follow-Up:  Patient Assistance Coordination Mailed application for Jardiance patient assistance to patient. Patient to complete and return it and any needed supporting documents for so that Delsa Grana, PA-C can provide prescription for application. Patient may contact me if any questions, phone number provided.

## 2019-12-18 ENCOUNTER — Telehealth: Payer: Self-pay

## 2019-12-18 NOTE — Progress Notes (Addendum)
12/18/2019 LVM on mobile phone number    Chronic Care Management Pharmacy Assistant   Name: Aaron Calhoun.  MRN: [id number] DOB: 02/16/1945  Reason for Encounter: Disease State  Patient Questions:  1.  Have you seen any other providers since your last visit? No  2.  Any changes in your medicines or health? No   Aaron Calhoun.,  74 y.o. , male presents for their Follow-Up CCM visit with the clinical pharmacist via telephone.  PCP : Delsa Grana, PA-C  Allergies:  No Known Allergies  Medications: Outpatient Encounter Medications as of 12/18/2019  Medication Sig   aspirin EC 81 MG tablet Take 81 mg by mouth daily.   empagliflozin (JARDIANCE) 10 MG TABS tablet Take 1 tablet (10 mg total) by mouth daily.   finasteride (PROSCAR) 5 MG tablet Take 1 tablet (5 mg total) by mouth daily.   gabapentin (NEURONTIN) 300 MG capsule TAKE 1 CAPSULE BY MOUTH 3  TIMES DAILY   glimepiride (AMARYL) 4 MG tablet TAKE 1 TABLET BY MOUTH  DAILY WITH BREAKFAST, HOLD  IF BLOOD SUGAR &lt;100 OR IF  NOT EATING   glucose blood (ONE TOUCH ULTRA TEST) test strip USE TO TEST BLOOD SUGAR TWICE DAILY   Lancets (ONETOUCH ULTRASOFT) lancets Test blood sugar 1x day Dx:E11.40, E11.9 LON:99   losartan-hydrochlorothiazide (HYZAAR) 100-25 MG tablet Take 1 tablet by mouth daily. For blood pressure   meclizine (ANTIVERT) 12.5 MG tablet Take 1 tablet (12.5 mg total) by mouth 3 (three) times daily as needed for dizziness. May make drowsy, not drive after taking   metFORMIN (GLUCOPHAGE) 1000 MG tablet TAKE 1 TABLET BY MOUTH  TWICE DAILY WITH A MEAL   rosuvastatin (CRESTOR) 5 MG tablet Take 1 tablet (5 mg total) by mouth at bedtime.   sildenafil (VIAGRA) 50 MG tablet Take 1-2 tablets (50-100 mg total) by mouth daily as needed for erectile dysfunction (take 30 min before sexual activity).   VITAMIN D PO Take 1,000 Units by mouth daily.    No facility-administered encounter medications on file as of 12/18/2019.    Current  Diagnosis: Patient Active Problem List   Diagnosis Date Noted   Vertigo 08/16/2019   Pain due to onychomycosis of toenails of both feet 07/07/2018   Encounter for screening colonoscopy    Benign neoplasm of descending colon    Polyp of sigmoid colon    Benign neoplasm of ascending colon    Penile irritation 03/24/2016   Chronic post-operative pain 01/21/2016   Lumbar spinal stenosis 04/22/2015   Type 2 diabetes, controlled, with neuropathy ([place]) 11/05/2014   Hypertension goal BP (blood pressure) < 140/90 11/05/2014   Hyperlipidemia 11/05/2014   Benign prostate hyperplasia 11/05/2014    Goals Addressed   None     Follow-Up:  Coordination of Enhanced Pharmacy Services and Pharmacist Review   .[name] Recent Relevant Labs: Lab Results  Component Value Date/Time   HGBA1C 7.6 (H) 08/23/2019 09:07 AM   HGBA1C 7.0 (H) 01/23/2019 08:48 AM   MICROALBUR <0.2 10/21/2018 12:00 AM   MICROALBUR 0.9 08/11/2017 09:33 AM   MICROALBUR NEG 07/19/2015 10:20 AM   MICROALBUR 20 07/03/2014 03:32 PM    Kidney Function Lab Results  Component Value Date/Time   CREATININE 0.81 08/23/2019 09:07 AM   CREATININE 0.76 08/12/2019 02:38 PM   CREATININE 0.84 01/23/2019 08:48 AM   GFRNONAA 88 08/23/2019 09:07 AM   GFRAA 101 08/23/2019 09:07 AM    Current antihyperglycemic regimen:  Jardiance 10 mg tabs 1 tablet  daily (awaiting PAP) Glimepiride 4 mg tab-Take 1 tablet by mouth daily with breakfast  Metformin 1000 mg-Take 1 tablet by mouth twice daily with meals What recent interventions/DTPs have been made to improve glycemic control:  none Have there been any recent hospitalizations or ED visits since last visit with CPP? No Patient denies hypoglycemic symptoms Patient denies hyperglycemic symptoms How often are you checking your blood sugar? infrequently What are your blood sugars ranging?  Fasting:  12/16/2019 176 12/18/2019 271  During the week, how often does your blood glucose drop below 70?  Never Are you checking your feet daily/regularly?  daily  Adherence Review: Is the patient currently on a STATIN medication? No Is the patient currently on ACE/ARB medication? Yes Does the patient have >5 day gap between last estimated fill dates? No  Pt encouraged to keep blood glucose log daily.  Pt encouraged to take blood glucose on a daily basis. Pt encouraged to keep up the great work in walking daily. Aaron Calhoun  Addendum 12/22/19: Patient with uncontrolled fasting readings, but not enough readings to note any trends. Will continue to have patient monitor blood sugars and discuss in detail at CPP visit in January. Anticipate increasing Jardiance / adding GLP-1 agonist if readings remain above goal.  [facility] [phone]

## 2019-12-26 ENCOUNTER — Telehealth: Payer: Self-pay

## 2019-12-26 NOTE — Telephone Encounter (Signed)
Patient was in office today with his wife for her Medicare AWV. Pt requested sample Accu Chek Guide me meter to assist with checking blood sugar at home due to pt stating his meter is very old and unsure if it's working well. Meter given to patient. Patient requests rx for:  Accu Chek guide test strips with testing directions and Accu Chek softclix lancets. Please send to [place] pharmacy.   Thank you!

## 2019-12-29 ENCOUNTER — Other Ambulatory Visit: Payer: Self-pay | Admitting: Emergency Medicine

## 2019-12-29 ENCOUNTER — Telehealth: Payer: Self-pay | Admitting: Family Medicine

## 2019-12-29 DIAGNOSIS — E1140 Type 2 diabetes mellitus with diabetic neuropathy, unspecified: Secondary | ICD-10-CM

## 2019-12-29 DIAGNOSIS — E119 Type 2 diabetes mellitus without complications: Secondary | ICD-10-CM

## 2019-12-29 DIAGNOSIS — IMO0002 Reserved for concepts with insufficient information to code with codable children: Secondary | ICD-10-CM

## 2019-12-29 MED ORDER — ACCU-CHEK GUIDE VI STRP: ORAL_STRIP | 3 refills | Status: DC

## 2019-12-29 MED ORDER — LANCETS MISC: 3 refills | Status: DC

## 2019-12-29 NOTE — Telephone Encounter (Signed)
Order pend to [employer]

## 2019-12-29 NOTE — Telephone Encounter (Signed)
Pt is needing test strips and the needles for Accu Check Meter he got from the [facility]. Pharm is [facility] on [address]

## 2019-12-29 NOTE — Telephone Encounter (Signed)
ACCUCHEK Guide ME meter. Testing BS 2 times a day

## 2020-01-18 ENCOUNTER — Ambulatory Visit: Payer: Medicare Other

## 2020-01-18 DIAGNOSIS — E1169 Type 2 diabetes mellitus with other specified complication: Secondary | ICD-10-CM

## 2020-01-18 DIAGNOSIS — E1140 Type 2 diabetes mellitus with diabetic neuropathy, unspecified: Secondary | ICD-10-CM

## 2020-01-18 DIAGNOSIS — E785 Hyperlipidemia, unspecified: Secondary | ICD-10-CM

## 2020-01-18 DIAGNOSIS — IMO0002 Reserved for concepts with insufficient information to code with codable children: Secondary | ICD-10-CM

## 2020-01-18 NOTE — Chronic Care Management (AMB) (Signed)
Chronic Care Management Pharmacy  Name: Aaron Calhoun.  MRN: [id number] DOB: 1945/02/19  Chief Complaint/ HPI  Aaron Calhoun.,  75 y.o. , male presents for their Follow-Up CCM visit with the clinical pharmacist via telephone.  PCP : Delsa Grana, PA-C  Their chronic conditions include: HTN, HLD, DM  BPH  Office Visits: 08/23/19: Patient presented to [facility] for follow-up.   Consult Visit:NA  Medications: Outpatient Encounter Medications as of 01/18/2020  Medication Sig  . aspirin EC 81 MG tablet Take 81 mg by mouth daily.  . empagliflozin (JARDIANCE) 10 MG TABS tablet Take 1 tablet (10 mg total) by mouth daily.  . finasteride (PROSCAR) 5 MG tablet Take 1 tablet (5 mg total) by mouth daily.  [name] gabapentin (NEURONTIN) 300 MG capsule TAKE 1 CAPSULE BY MOUTH 3  TIMES DAILY  . glimepiride (AMARYL) 4 MG tablet TAKE 1 TABLET BY MOUTH  DAILY WITH BREAKFAST, HOLD  IF BLOOD SUGAR <100 OR IF  NOT EATING  . glucose blood (ACCU-CHEK GUIDE) test strip Use as instructed  . Lancets (ONETOUCH ULTRASOFT) lancets Test blood sugar 1x day Dx:E11.40, E11.9 LON:99  . Lancets MISC Use as directed to monitor blood sugar once daily. Dx: E11.65.  [name] losartan-hydrochlorothiazide (HYZAAR) 100-25 MG tablet Take 1 tablet by mouth daily. For blood pressure  . meclizine (ANTIVERT) 12.5 MG tablet Take 1 tablet (12.5 mg total) by mouth 3 (three) times daily as needed for dizziness. May make drowsy, not drive after taking  . metFORMIN (GLUCOPHAGE) 1000 MG tablet TAKE 1 TABLET BY MOUTH  TWICE DAILY WITH A MEAL  . rosuvastatin (CRESTOR) 5 MG tablet Take 1 tablet (5 mg total) by mouth at bedtime.  . sildenafil (VIAGRA) 50 MG tablet Take 1-2 tablets (50-100 mg total) by mouth daily as needed for erectile dysfunction (take 30 min before sexual activity).  [name] VITAMIN D PO Take 1,000 Units by mouth daily.    No facility-administered encounter medications on file as of 01/18/2020.   Financial Resource Strain: Low  Risk   . Difficulty of Paying Living Expenses: Not very hard     Current Diagnosis/Assessment:  Goals Addressed   None    Hypertension   BP goal is:  <140/90  Office blood pressures are  BP Readings from Last 3 Encounters:  08/23/19 124/82  08/16/19 132/82  08/12/19 (!) 135/71   Patient checks BP at home Never Patient home BP readings are ranging: NA  Patient has failed these meds in the past: NA Patient is currently controlled on the following medications:  . Losartan-HCTZ 100-25 mg daily   We discussed diet and exercise extensively  Plan  Continue current medications  Recommend purchasing BP monitor or monitoring using in-store monitor   Diabetes   Recent Relevant Labs: Lab Results  Component Value Date/Time   HGBA1C 7.6 (H) 08/23/2019 09:07 AM   HGBA1C 7.0 (H) 01/23/2019 08:48 AM   MICROALBUR <0.2 10/21/2018 12:00 AM   MICROALBUR 0.9 08/11/2017 09:33 AM   MICROALBUR NEG 07/19/2015 10:20 AM   MICROALBUR 20 07/03/2014 03:32 PM     Checking BG: Weekly  Recent Fasting BGS: 165, 208,   Patient is currently uncontrolled on the following medications:  medications:  . Jardiance 10 mg daily  . Glimepiride 4 mg daily  . Metformin 1000 mg twice daily   Last diabetic Foot exam:  Lab Results  Component Value Date/Time   HMDIABEYEEXA No Retinopathy 03/06/2019 12:00 AM    Last diabetic Eye exam: No  results found for: HMDIABFOOTEX   We discussed: Patient completed PAP form, will submit application for 1779. Patient has not been routinely checking his blood sugars, but they have been elevated recently.   Plan  Jardiance PAP Submitted  Patient to increase monitoring to daily and record.  CPA Assessment in 2 months  Hyperlipidemia   LDL goal < 70  Lipid Panel     Component Value Date/Time   CHOL 118 01/23/2019 0848   CHOL 140 07/19/2015 1016   TRIG 76 01/23/2019 0848   HDL 56 01/23/2019 0848   HDL 70 07/19/2015 1016   LDLCALC 46 01/23/2019 0848     Hepatic Function Latest Ref Rng & Units 08/23/2019 01/23/2019 10/21/2018  Total Protein 6.1 - 8.1 g/dL 7.1 7.3 7.4  Albumin 3.6 - 5.1 g/dL - - -  AST 10 - 35 U/L '17 16 19  ' ALT 9 - 46 U/L '29 21 27  ' Alk Phosphatase 40 - 115 U/L - - -  Total Bilirubin 0.2 - 1.2 mg/dL 0.5 0.7 0.6     The ASCVD Risk score ([place]., et al., 2013) failed to calculate for the following reasons:   The valid total cholesterol range is 130 to 320 mg/dL   Patient has failed these meds in past: NA Patient is currently controlled on the following medications:  . Aspirin 81 mg daily  . Rosuvastatin 87m daily  We discussed: At goal; Denies myalgias  Plan  Continue current medications   BPH   PSA  Date Value Ref Range Status  10/21/2018 0.3 < OR = 4.0 ng/mL Final    Comment:    The total PSA value from this assay system is  standardized against the WHO standard. The test  result will be approximately 20% lower when compared  to the equimolar-standardized total PSA (Beckman  Coulter). Comparison of serial PSA results should be  interpreted with this fact in mind. . This test was performed using the Siemens  chemiluminescent method. Values obtained from  different assay methods cannot be used interchangeably. PSA levels, regardless of value, should not be interpreted as absolute evidence of the presence or absence of disease.   08/11/2017 0.3 < OR = 4.0 ng/mL Final    Comment:    The total PSA value from this assay system is  standardized against the WHO standard. The test  result will be approximately 20% lower when compared  to the equimolar-standardized total PSA (Beckman  Coulter). Comparison of serial PSA results should be  interpreted with this fact in mind. . This test was performed using the Siemens  chemiluminescent method. Values obtained from  different assay methods cannot be used interchangeably. PSA levels, regardless of value, should not be interpreted as absolute evidence of  the presence or absence of disease.   04/01/2016 0.6 <=4.0 ng/mL Final    Comment:      The total PSA value from this assay system is standardized against the WHO standard. The test result will be approximately 20% lower when compared to the equimolar-standardized total PSA (Beckman Coulter). Comparison of serial PSA results should be interpreted with this fact in mind.   This test was performed using the Siemens chemiluminescent method. Values obtained from different assay methods cannot be used interchangeably. PSA levels, regardless of value, should not be interpreted as absolute evidence of the presence or absence of disease.        Patient has failed these meds in past: NA Patient is currently controlled on  the following medications:  . Finasteride 5 mg daily   We discussed:  Stable  Plan  Continue current medications  Misc / OTC    . Gabapentin 300 mg TID  . Meclizine 12.5 mg TID PRN  . Sildenafil 50 mg 1-2 tablets daily PRN  . Vitamin D 1000 units daily   Plan  Continue current medications   Medication Management   Patient's preferred pharmacy is:  [facility], [address], [address] [place], [place] [address] Phone: [phone] Fax: [facility] [address], [place] - [place] [address] Phone: [phone] Fax: [phone]  Uses pill box? Yes Pt endorses 100% compliance  We discussed: Current pharmacy is preferred with insurance plan and patient is satisfied with pharmacy services  Plan  Continue current medication management strategy  Follow up: 6 month phone visit  [facility] [phone]

## 2020-01-18 NOTE — Patient Instructions (Addendum)
Visit Information It was great speaking with you today!  Please let me know if you have any questions about our visit. Goals Addressed            This Visit's Progress   . Chronic Care Management       CARE PLAN ENTRY (see longitudinal plan of care for additional care plan information)  Current Barriers:  . Chronic Disease Management support, education, and care coordination needs related to Hypertension, Hyperlipidemia, Diabetes, and BPH   Hypertension BP Readings from Last 3 Encounters:  05/23/19 136/80  03/07/19 122/76  01/23/19 132/70   . Pharmacist Clinical Goal(s): o Over the next 90 days, patient will work with PharmD and providers to maintain BP goal <130/80 . Current regimen:  o Losartan/hydrochlorothiazide 100/25mg  daily . Interventions: o None . Patient self care activities - Over the next 90 days, patient will: o Check BP weekly, document, and provide at future appointments o Ensure daily salt intake < 2300 mg/day  Hyperlipidemia Lab Results  Component Value Date/Time   LDLCALC 46 01/23/2019 08:48 AM   . Pharmacist Clinical Goal(s): o Over the next 90 days, patient will work with PharmD and providers to maintain LDL goal < 70 . Current regimen:  o Rosuvastatin 5mg  daily . Interventions: o None . Patient self care activities - Over the next 90 days, patient will: o Continue current medication adherence strategies  o Continue current lifestyle modifications  Diabetes Lab Results  Component Value Date/Time   HGBA1C 7.0 (H) 01/23/2019 08:48 AM   HGBA1C 6.6 (H) 10/21/2018 12:00 AM   . Pharmacist Clinical Goal(s): o Over the next 90 days, patient will work with PharmD and providers to achieve A1c goal <7% . Current regimen:  o Metformin 1000mg  twice daily with food o Glimepiride 4mg  daily o Jardiance 10 mg daily  . Patient self care activities - Over the next 90 days, patient will: o Check blood sugar once daily, document, and provide at future  appointments o Contact provider with any episodes of hypoglycemia  Medication management . Pharmacist Clinical Goal(s): o Over the next 90 days, patient will work with PharmD and providers to achieve optimal medication adherence . Current pharmacy: [facility] . Interventions o Comprehensive medication review performed. o Continue current medication management strategy . Patient self care activities - Over the next 90 days, patient will: o Take medications as prescribed o Report any questions or concerns to PharmD and/or provider(s)       The patient verbalized understanding of instructions, educational materials, and care plan provided today and declined offer to receive copy of patient instructions, educational materials, and care plan.   Telephone follow up appointment with pharmacy team member scheduled for: 07/19/19 at 1:00 PM  Clinical Pharmacist [facility] [phone]

## 2020-01-25 LAB — LIPID PANEL
Cholesterol: 127 (ref 0–200)
HDL: 71 — AB (ref 35–70)
LDL Cholesterol: 37
Triglycerides: 137 (ref 40–160)

## 2020-01-25 LAB — BASIC METABOLIC PANEL
BUN: 18 (ref 4–21)
Creatinine: 0.9 (ref 0.6–1.3)
Glucose: 147
Potassium: 3.8 (ref 3.4–5.3)
Sodium: 139 (ref 137–147)

## 2020-01-25 LAB — HEPATITIS B SURFACE ANTIGEN: Hepatitis B Surface Ag: NEGATIVE

## 2020-01-25 LAB — HIV ANTIBODY (ROUTINE TESTING W REFLEX): HIV 1&2 Ab, 4th Generation: NONREACTIVE

## 2020-01-25 LAB — PSA: PSA: 0.29

## 2020-01-25 LAB — HEMOGLOBIN A1C: Hemoglobin A1C: 8.7

## 2020-01-25 LAB — VITAMIN D 25 HYDROXY (VIT D DEFICIENCY, FRACTURES): Vit D, 25-Hydroxy: 32.3

## 2020-02-23 ENCOUNTER — Other Ambulatory Visit: Payer: Self-pay

## 2020-02-23 ENCOUNTER — Ambulatory Visit (INDEPENDENT_AMBULATORY_CARE_PROVIDER_SITE_OTHER): Payer: HMO | Admitting: Family Medicine

## 2020-02-23 ENCOUNTER — Encounter: Payer: Self-pay | Admitting: Family Medicine

## 2020-02-23 VITALS — BP 118/78 | HR 87 | Temp 98.2°F | Resp 16 | Ht 65.0 in | Wt 157.4 lb

## 2020-02-23 DIAGNOSIS — I10 Essential (primary) hypertension: Secondary | ICD-10-CM | POA: Diagnosis not present

## 2020-02-23 DIAGNOSIS — G8928 Other chronic postprocedural pain: Secondary | ICD-10-CM | POA: Diagnosis not present

## 2020-02-23 DIAGNOSIS — N401 Enlarged prostate with lower urinary tract symptoms: Secondary | ICD-10-CM | POA: Diagnosis not present

## 2020-02-23 DIAGNOSIS — R351 Nocturia: Secondary | ICD-10-CM | POA: Diagnosis not present

## 2020-02-23 DIAGNOSIS — E785 Hyperlipidemia, unspecified: Secondary | ICD-10-CM

## 2020-02-23 DIAGNOSIS — M79675 Pain in left toe(s): Secondary | ICD-10-CM

## 2020-02-23 DIAGNOSIS — Z9189 Other specified personal risk factors, not elsewhere classified: Secondary | ICD-10-CM | POA: Insufficient documentation

## 2020-02-23 DIAGNOSIS — B351 Tinea unguium: Secondary | ICD-10-CM

## 2020-02-23 DIAGNOSIS — E7800 Pure hypercholesterolemia, unspecified: Secondary | ICD-10-CM

## 2020-02-23 DIAGNOSIS — Z5181 Encounter for therapeutic drug level monitoring: Secondary | ICD-10-CM

## 2020-02-23 DIAGNOSIS — M79674 Pain in right toe(s): Secondary | ICD-10-CM

## 2020-02-23 DIAGNOSIS — R42 Dizziness and giddiness: Secondary | ICD-10-CM | POA: Diagnosis not present

## 2020-02-23 DIAGNOSIS — E1140 Type 2 diabetes mellitus with diabetic neuropathy, unspecified: Secondary | ICD-10-CM | POA: Diagnosis not present

## 2020-02-23 DIAGNOSIS — E1165 Type 2 diabetes mellitus with hyperglycemia: Secondary | ICD-10-CM

## 2020-02-23 DIAGNOSIS — E1169 Type 2 diabetes mellitus with other specified complication: Secondary | ICD-10-CM | POA: Diagnosis not present

## 2020-02-23 DIAGNOSIS — IMO0002 Reserved for concepts with insufficient information to code with codable children: Secondary | ICD-10-CM
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
## 2020-02-23 NOTE — Patient Instructions (Signed)
Health Maintenance  Topic Date Due  . Hemoglobin A1C  02/23/2020  . Eye exam for diabetics  03/05/2020  . Complete foot exam   02/22/2021  . Colon Cancer Screening  03/25/2023  . Flu Shot  Completed  . COVID-19 Vaccine  Completed  .  Hepatitis C: One time screening is recommended by Center for Disease Control  (CDC) for  adults born from [birth year] through 1966.   Completed  . Pneumonia vaccines  Completed  . Tetanus Vaccine  Discontinued   Please bring in your last lab results and any paperwork with med list from the [facility] in with your blood pressure meter and do a nurse visit and make sure your reading are accurate and the nurse can help you with that.  For your age we want to keep your A1C closer to 7.0 - 7.5 and want to avoid going >8.0  If your labs are abnormal or high we will need to have you come in sooner to adjust medications or come up with a treatment plan    Blood Glucose Monitoring, Adult Monitoring your blood sugar (glucose) is an important part of managing your diabetes. Blood glucose monitoring involves checking your blood glucose as often as directed and keeping a log or record of your results over time. Checking your blood glucose regularly and keeping a blood glucose log can:  Help you and your health care provider adjust your diabetes management plan as needed, including your medicines or insulin.  Help you understand how food, exercise, illnesses, and medicines affect your blood glucose.  Let you know what your blood glucose is at any time. You can quickly find out if you have low blood glucose (hypoglycemia) or high blood glucose (hyperglycemia). Your health care provider will set individualized treatment goals for you. Your goals will be based on your age, other medical conditions you have, and how you respond to diabetes treatment. Generally, the goal of treatment is to maintain the following blood glucose levels:  Before meals (preprandial): 80-130 mg/dL (4.4-7.2  mmol/L).  After meals (postprandial): below 180 mg/dL (10 mmol/L).  A1C level: less than 7%. Supplies needed:  Blood glucose meter.  Test strips for your meter. Each meter has its own strips. You must use the strips that came with your meter.  A needle to prick your finger (lancet). Do not use a lancet more than one time.  A device that holds the lancet (lancing device).  A journal or log book to write down your results. How to check your blood glucose Checking your blood glucose 1. Wash your hands for at least 20 seconds with soap and water. 2. Prick the side of your finger (not the tip) with the lancet. Do not use the same finger consecutively. 3. Gently rub the finger until a small drop of blood appears. 4. Follow instructions that come with your meter for inserting the test strip, applying blood to the strip, and using your blood glucose meter. 5. Write down your result and any notes in your log.   Using alternative sites Some meters allow you to use areas of your body other than your finger (alternative sites) to test your blood. The most common alternative sites are the forearm, the thigh, and the palm of your hand. Alternative sites may not be as accurate as the fingers because blood flow is slower in those areas. This means that the result you get may be delayed, and it may be different from the result that  you would get from your finger. Use the finger only, and do not use alternative sites, if:  You think you have hypoglycemia.  You sometimes do not know that your blood glucose is getting low (hypoglycemia unawareness). General tips and recommendations Blood glucose log  Every time you check your blood glucose, write down your result. Also write down any notes about things that may be affecting your blood glucose, such as your diet and exercise for the day. This information can help you and your health care provider: ? Look for patterns in your blood glucose over  time. ? Adjust your diabetes management plan as needed.  Check if your meter allows you to download your records to a computer or if there is an app for the meter. Most glucose meters store a record of glucose readings in the meter.   If you have type 1 diabetes:  Check your blood glucose 4 or more times a day if you are on intensive insulin therapy with multiple daily injections (MDI) or if you are using an insulin pump. Check your blood glucose: ? Before every meal and snack. ? Before bedtime.  Also check your blood glucose: ? If you have symptoms of hypoglycemia. ? After treating low blood glucose. ? Before doing activities that create a risk for injury, like driving or using machinery. ? Before and after exercise. ? Two hours after a meal. ? Occasionally between 2:00 a.m. and 3:00 a.m., as directed.  You may need to check your blood glucose more often, 6-10 times per day, if: ? You have diabetes that is not well controlled. ? You are ill. ? You have a history of severe hypoglycemia. ? You have hypoglycemia unawareness. If you have type 2 diabetes:  Check your blood glucose 2 or more times a day if you take insulin or other diabetes medicines.  Check your blood glucose 4 or more times a day if you are on intensive insulin therapy. Occasionally, you may also need to check your glucose between 2:00 a.m. and 3:00 a.m., as directed.  Also check your blood glucose: ? Before and after exercise. ? Before doing activities that create a risk for injury, like driving or using machinery.  You may need to check your blood glucose more often if: ? Your medicine is being adjusted. ? Your diabetes is not well controlled. ? You are ill. General tips  Make sure you always have your supplies with you.  After you use a few boxes of test strips, adjust (calibrate) your blood glucose meter by following instructions that came with your meter.  If you have questions or need help, all blood  glucose meters have a 24-hour hotline phone number available that you can call. Also contact your health care provider with questions or concerns you may have. Where to find more information  The American Diabetes Association: www.diabetes.org  The Association of Diabetes Care & Education Specialists: www.diabeteseducator.org Contact a health care provider if:  Your blood glucose is at or above 240 mg/dL (13.3 mmol/L) for 2 days in a row.  You have been sick or have had a fever for 2 days or longer, and you are not getting better.  You have any of the following problems for more than 6 hours: ? You cannot eat or drink. ? You have nausea or vomiting. ? You have diarrhea. Get help right away if:  Your blood glucose is lower than 54 mg/dL (3 mmol/L).  You become confused, or you have trouble  thinking clearly.  You have difficulty breathing.  You have moderate or large ketone levels in your urine. These symptoms may represent a serious problem that is an emergency. Do not wait to see if the symptoms will go away. Get medical help right away. Call your local emergency services (911 in the [place] Do not drive yourself to the hospital. Summary  Monitoring your blood glucose is an important part of managing your diabetes.  Blood glucose monitoring involves checking your blood glucose as often as directed and keeping a log or record of your results over time.  Your health care provider will set individualized treatment goals for you. Your goals will be based on your age, other medical conditions you have, and how you respond to diabetes treatment.  Every time you check your blood glucose, write down your result. Also, write down any notes about things that may be affecting your blood glucose, such as your diet and exercise for the day. This information is not intended to replace advice given to you by your health care provider. Make sure you discuss any questions you have with your health  care provider. Document Revised: 09/27/2019 Document Reviewed: 09/27/2019 Elsevier Patient Education  2021 [employer].

## 2020-02-24 LAB — COMPLETE METABOLIC PANEL WITH GFR
AG Ratio: 2 (calc) (ref 1.0–2.5)
ALT: 18 U/L (ref 9–46)
AST: 13 U/L (ref 10–35)
Albumin: 4.8 g/dL (ref 3.6–5.1)
Alkaline phosphatase (APISO): 57 U/L (ref 35–144)
BUN: 16 mg/dL (ref 7–25)
CO2: 29 mmol/L (ref 20–32)
Calcium: 9.7 mg/dL (ref 8.6–10.3)
Chloride: 102 mmol/L (ref 98–110)
Creat: 0.85 mg/dL (ref 0.70–1.18)
GFR, Est African American: 99 mL/min/{1.73_m2} (ref >=60)
GFR, Est Non African American: 85 mL/min/{1.73_m2} (ref >=60)
Globulin: 2.4 g/dL (calc) (ref 1.9–3.7)
Glucose, Bld: 169 mg/dL — ABNORMAL HIGH (ref 65–99)
Potassium: 3.8 mmol/L (ref 3.5–5.3)
Sodium: 140 mmol/L (ref 135–146)
Total Bilirubin: 0.7 mg/dL (ref 0.2–1.2)
Total Protein: 7.2 g/dL (ref 6.1–8.1)

## 2020-02-24 LAB — LIPID PANEL
Cholesterol: 103 mg/dL (ref <200)
HDL: 53 mg/dL (ref >=40)
LDL Cholesterol (Calc): 32 mg/dL (calc)
Non-HDL Cholesterol (Calc): 50 mg/dL (calc) (ref <130)
Total CHOL/HDL Ratio: 1.9 (calc) (ref <5.0)
Triglycerides: 96 mg/dL (ref <150)

## 2020-02-24 LAB — HEMOGLOBIN A1C
Hgb A1c MFr Bld: 8.4 % of total Hgb — ABNORMAL HIGH (ref <5.7)
Mean Plasma Glucose: 194 mg/dL
eAG (mmol/L): 10.8 mmol/L

## 2020-02-29 ENCOUNTER — Other Ambulatory Visit: Payer: Self-pay

## 2020-02-29 DIAGNOSIS — IMO0002 Reserved for concepts with insufficient information to code with codable children: Secondary | ICD-10-CM

## 2020-02-29 DIAGNOSIS — R351 Nocturia: Secondary | ICD-10-CM

## 2020-02-29 DIAGNOSIS — N401 Enlarged prostate with lower urinary tract symptoms: Secondary | ICD-10-CM

## 2020-02-29 DIAGNOSIS — E1140 Type 2 diabetes mellitus with diabetic neuropathy, unspecified: Secondary | ICD-10-CM

## 2020-02-29 DIAGNOSIS — I10 Essential (primary) hypertension: Secondary | ICD-10-CM

## 2020-02-29 DIAGNOSIS — G8928 Other chronic postprocedural pain: Secondary | ICD-10-CM

## 2020-02-29 DIAGNOSIS — E7800 Pure hypercholesterolemia, unspecified: Secondary | ICD-10-CM

## 2020-02-29 MED ORDER — EMPAGLIFLOZIN 10 MG PO TABS: 10.0000 mg | ORAL_TABLET | Freq: Every day | ORAL | 11 refills | Status: DC

## 2020-02-29 MED ORDER — ROSUVASTATIN CALCIUM 5 MG PO TABS: 5.0000 mg | ORAL_TABLET | Freq: Every day | ORAL | 1 refills | Status: DC

## 2020-02-29 MED ORDER — GLIMEPIRIDE 4 MG PO TABS: ORAL_TABLET | ORAL | 1 refills | Status: DC

## 2020-02-29 MED ORDER — GABAPENTIN 300 MG PO CAPS: 300.0000 mg | ORAL_CAPSULE | Freq: Three times a day (TID) | ORAL | 1 refills | Status: DC

## 2020-02-29 MED ORDER — METFORMIN HCL 1000 MG PO TABS: ORAL_TABLET | ORAL | 1 refills | Status: DC

## 2020-02-29 MED ORDER — FINASTERIDE 5 MG PO TABS: 5.0000 mg | ORAL_TABLET | Freq: Every day | ORAL | 3 refills | Status: DC

## 2020-02-29 MED ORDER — LOSARTAN POTASSIUM-HCTZ 100-25 MG PO TABS: 1.0000 | ORAL_TABLET | Freq: Every day | ORAL | 1 refills | Status: DC

## 2020-03-04 ENCOUNTER — Other Ambulatory Visit: Payer: Self-pay | Admitting: Family Medicine

## 2020-03-04 ENCOUNTER — Encounter: Payer: Self-pay | Admitting: Family Medicine

## 2020-03-04 DIAGNOSIS — E1140 Type 2 diabetes mellitus with diabetic neuropathy, unspecified: Secondary | ICD-10-CM

## 2020-03-04 MED ORDER — EMPAGLIFLOZIN 10 MG PO TABS: 10.0000 mg | ORAL_TABLET | Freq: Every day | ORAL | 0 refills | Status: DC

## 2020-03-04 NOTE — Telephone Encounter (Signed)
Melissa w/ [facility] calling to get the Rx empagliflozin (JARDIANCE) 10 MG TABS tablet Changed to 90 day w/ refills since they are mail order. (not 30 w/ 11 refills) cb [phone] Ref  [id number]  [place]

## 2020-03-04 NOTE — Telephone Encounter (Signed)
Melissa w/ [facility] pharmacy calling to get the Rx empagliflozin (JARDIANCE) 10 MG TABS tablet Changed to 90 day w/ refills since they are mail order. (not 30 w/ 11 refills) cb [phone] Ref  [id number]   [place]   Attempted to call pharmacy- estimated wait time 10 minutes +- message sent to office for follow up- Request change Rx for 90 day supply

## 2020-03-07 ENCOUNTER — Other Ambulatory Visit: Payer: Self-pay

## 2020-03-07 ENCOUNTER — Ambulatory Visit (INDEPENDENT_AMBULATORY_CARE_PROVIDER_SITE_OTHER): Payer: HMO

## 2020-03-07 VITALS — BP 108/68 | HR 71 | Temp 98.3°F | Resp 16 | Ht 65.0 in | Wt 161.9 lb

## 2020-03-07 DIAGNOSIS — Z0000 Encounter for general adult medical examination without abnormal findings: Secondary | ICD-10-CM
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
## 2020-03-07 NOTE — Patient Instructions (Signed)
Aaron Calhoun , Thank you for taking time to come for your Medicare Wellness Visit. I appreciate your ongoing commitment to your health goals. Please review the following plan we discussed and let me know if I can assist you in the future.   Screening recommendations/referrals: Colonoscopy: done 03/25/18 Recommended yearly ophthalmology/optometry visit for glaucoma screening and checkup Recommended yearly dental visit for hygiene and checkup  Vaccinations: Influenza vaccine: done 10/22/19 Pneumococcal vaccine: done 06/17/18 Tdap vaccine: due Shingles vaccine: done; will request records from [place].    Covid-19: done 02/24/19, 03/27/19 & 11/14/19  Conditions/risks identified: Recommend healthy eating and physical activity to lower A1c.   Next appointment: Follow up in one year for your annual wellness visit.   Preventive Care 70 Years and Older, Male Preventive care refers to lifestyle choices and visits with your health care provider that can promote health and wellness. What does preventive care include?  A yearly physical exam. This is also called an annual well check.  Dental exams once or twice a year.  Routine eye exams. Ask your health care provider how often you should have your eyes checked.  Personal lifestyle choices, including:  Daily care of your teeth and gums.  Regular physical activity.  Eating a healthy diet.  Avoiding tobacco and drug use.  Limiting alcohol use.  Practicing safe sex.  Taking low doses of aspirin every day.  Taking vitamin and mineral supplements as recommended by your health care provider. What happens during an annual well check? The services and screenings done by your health care provider during your annual well check will depend on your age, overall health, lifestyle risk factors, and family history of disease. Counseling  Your health care provider may ask you questions about your:  Alcohol use.  Tobacco use.  Drug  use.  Emotional well-being.  Home and relationship well-being.  Sexual activity.  Eating habits.  History of falls.  Memory and ability to understand (cognition).  Work and work Statistician. Screening  You may have the following tests or measurements:  Height, weight, and BMI.  Blood pressure.  Lipid and cholesterol levels. These may be checked every 5 years, or more frequently if you are over 62 years old.  Skin check.  Lung cancer screening. You may have this screening every year starting at age 66 if you have a 30-pack-year history of smoking and currently smoke or have quit within the past 15 years.  Fecal occult blood test (FOBT) of the stool. You may have this test every year starting at age 27.  Flexible sigmoidoscopy or colonoscopy. You may have a sigmoidoscopy every 5 years or a colonoscopy every 10 years starting at age 46.  Prostate cancer screening. Recommendations will vary depending on your family history and other risks.  Hepatitis C blood test.  Hepatitis B blood test.  Sexually transmitted disease (STD) testing.  Diabetes screening. This is done by checking your blood sugar (glucose) after you have not eaten for a while (fasting). You may have this done every 1-3 years.  Abdominal aortic aneurysm (AAA) screening. You may need this if you are a current or former smoker.  Osteoporosis. You may be screened starting at age 45 if you are at high risk. Talk with your health care provider about your test results, treatment options, and if necessary, the need for more tests. Vaccines  Your health care provider may recommend certain vaccines, such as:  Influenza vaccine. This is recommended every year.  Tetanus, diphtheria, and  acellular pertussis (Tdap, Td) vaccine. You may need a Td booster every 10 years.  Zoster vaccine. You may need this after age 6.  Pneumococcal 13-valent conjugate (PCV13) vaccine. One dose is recommended after age  26.  Pneumococcal polysaccharide (PPSV23) vaccine. One dose is recommended after age 52. Talk to your health care provider about which screenings and vaccines you need and how often you need them. This information is not intended to replace advice given to you by your health care provider. Make sure you discuss any questions you have with your health care provider. Document Released: 01/25/2015 Document Revised: 09/18/2015 Document Reviewed: 10/30/2014 Elsevier Interactive Patient Education  2017 [place] Prevention in the Home Falls can cause injuries. They can happen to people of all ages. There are many things you can do to make your home safe and to help prevent falls. What can I do on the outside of my home?  Regularly fix the edges of walkways and driveways and fix any cracks.  Remove anything that might make you trip as you walk through a door, such as a raised step or threshold.  Trim any bushes or trees on the path to your home.  Use bright outdoor lighting.  Clear any walking paths of anything that might make someone trip, such as rocks or tools.  Regularly check to see if handrails are loose or broken. Make sure that both sides of any steps have handrails.  Any raised decks and porches should have guardrails on the edges.  Have any leaves, snow, or ice cleared regularly.  Use sand or salt on walking paths during winter.  Clean up any spills in your garage right away. This includes oil or grease spills. What can I do in the bathroom?  Use night lights.  Install grab bars by the toilet and in the tub and shower. Do not use towel bars as grab bars.  Use non-skid mats or decals in the tub or shower.  If you need to sit down in the shower, use a plastic, non-slip stool.  Keep the floor dry. Clean up any water that spills on the floor as soon as it happens.  Remove soap buildup in the tub or shower regularly.  Attach bath mats securely with double-sided  non-slip rug tape.  Do not have throw rugs and other things on the floor that can make you trip. What can I do in the bedroom?  Use night lights.  Make sure that you have a light by your bed that is easy to reach.  Do not use any sheets or blankets that are too big for your bed. They should not hang down onto the floor.  Have a firm chair that has side arms. You can use this for support while you get dressed.  Do not have throw rugs and other things on the floor that can make you trip. What can I do in the kitchen?  Clean up any spills right away.  Avoid walking on wet floors.  Keep items that you use a lot in easy-to-reach places.  If you need to reach something above you, use a strong step stool that has a grab bar.  Keep electrical cords out of the way.  Do not use floor polish or wax that makes floors slippery. If you must use wax, use non-skid floor wax.  Do not have throw rugs and other things on the floor that can make you trip. What can I do with my stairs?  Do not leave any items on the stairs.  Make sure that there are handrails on both sides of the stairs and use them. Fix handrails that are broken or loose. Make sure that handrails are as long as the stairways.  Check any carpeting to make sure that it is firmly attached to the stairs. Fix any carpet that is loose or worn.  Avoid having throw rugs at the top or bottom of the stairs. If you do have throw rugs, attach them to the floor with carpet tape.  Make sure that you have a light switch at the top of the stairs and the bottom of the stairs. If you do not have them, ask someone to add them for you. What else can I do to help prevent falls?  Wear shoes that:  Do not have high heels.  Have rubber bottoms.  Are comfortable and fit you well.  Are closed at the toe. Do not wear sandals.  If you use a stepladder:  Make sure that it is fully opened. Do not climb a closed stepladder.  Make sure that both  sides of the stepladder are locked into place.  Ask someone to hold it for you, if possible.  Clearly mark and make sure that you can see:  Any grab bars or handrails.  First and last steps.  Where the edge of each step is.  Use tools that help you move around (mobility aids) if they are needed. These include:  Canes.  Walkers.  Scooters.  Crutches.  Turn on the lights when you go into a dark area. Replace any light bulbs as soon as they burn out.  Set up your furniture so you have a clear path. Avoid moving your furniture around.  If any of your floors are uneven, fix them.  If there are any pets around you, be aware of where they are.  Review your medicines with your doctor. Some medicines can make you feel dizzy. This can increase your chance of falling. Ask your doctor what other things that you can do to help prevent falls. This information is not intended to replace advice given to you by your health care provider. Make sure you discuss any questions you have with your health care provider. Document Released: 10/25/2008 Document Revised: 06/06/2015 Document Reviewed: 02/02/2014 Elsevier Interactive Patient Education  2017 [employer].

## 2020-03-15 ENCOUNTER — Telehealth: Payer: Self-pay

## 2020-03-15 DIAGNOSIS — IMO0002 Reserved for concepts with insufficient information to code with codable children: Secondary | ICD-10-CM

## 2020-03-15 DIAGNOSIS — E1140 Type 2 diabetes mellitus with diabetic neuropathy, unspecified: Secondary | ICD-10-CM

## 2020-03-15 NOTE — Telephone Encounter (Signed)
Per Bonnita Nasuti he wants to be placed on the [facility]. Please do so. Thanks

## 2020-03-15 NOTE — Telephone Encounter (Signed)
He is interested per your lab not in community care program.

## 2020-03-18 NOTE — Telephone Encounter (Signed)
In your result note you recommended community health program and he said ok.  What is community health program?

## 2020-03-21 ENCOUNTER — Telehealth: Payer: Self-pay | Admitting: *Deleted

## 2020-03-21 NOTE — Chronic Care Management (AMB) (Signed)
  Care Management   Note  03/21/2020 Name: Aaron Calhoun. MRN: [id number] DOB: 01-Jan-1946  [name]. is a 75 y.o. year old male who is a primary care patient of Laurell Roof and is actively engaged with the care management team. I reached out to [name] by phone today to assist with scheduling a follow up visit with the Pharmacist.  Follow up plan: Telephone appointment with care management team member scheduled for:04/04/2020  [place] Management

## 2020-03-27 ENCOUNTER — Encounter: Payer: Self-pay | Admitting: Family Medicine

## 2020-03-27 MED ORDER — FINASTERIDE 5 MG PO TABS: 5.0000 mg | ORAL_TABLET | Freq: Every day | ORAL | 3 refills | Status: DC

## 2020-03-27 MED ORDER — TAMSULOSIN HCL 0.4 MG PO CAPS: 0.4000 mg | ORAL_CAPSULE | Freq: Every day | ORAL | 3 refills | Status: DC

## 2020-04-03 ENCOUNTER — Telehealth: Payer: Self-pay

## 2020-04-03 NOTE — Progress Notes (Signed)
Left a voice message  to confirmed patient telephone appointment on 04/04/2020 for CCM at 1:00 pm with [name] the Clinical pharmacist.   [place] Pharmacist Assistant [phone]

## 2020-04-04 ENCOUNTER — Ambulatory Visit (INDEPENDENT_AMBULATORY_CARE_PROVIDER_SITE_OTHER): Payer: HMO

## 2020-04-04 DIAGNOSIS — IMO0002 Reserved for concepts with insufficient information to code with codable children: Secondary | ICD-10-CM

## 2020-04-04 DIAGNOSIS — E1140 Type 2 diabetes mellitus with diabetic neuropathy, unspecified: Secondary | ICD-10-CM

## 2020-04-04 DIAGNOSIS — I152 Hypertension secondary to endocrine disorders: Secondary | ICD-10-CM | POA: Diagnosis not present

## 2020-04-04 DIAGNOSIS — E1159 Type 2 diabetes mellitus with other circulatory complications: Secondary | ICD-10-CM

## 2020-04-04 DIAGNOSIS — E785 Hyperlipidemia, unspecified: Secondary | ICD-10-CM

## 2020-04-04 DIAGNOSIS — E1169 Type 2 diabetes mellitus with other specified complication: Secondary | ICD-10-CM

## 2020-04-04 DIAGNOSIS — E1165 Type 2 diabetes mellitus with hyperglycemia: Secondary | ICD-10-CM | POA: Diagnosis not present

## 2020-04-04 NOTE — Progress Notes (Signed)
Chronic Care Management Pharmacy Note  04/04/2020 Name:  Aaron Calhoun. MRN:  [id number] DOB:  12-01-1945  Subjective: Aaron Litt. is an 75 y.o. year old male who is a primary patient of Delsa Grana, [place].  The CCM team was consulted for assistance with disease management and care coordination needs.    Engaged with patient by telephone for follow up visit in response to provider referral for pharmacy case management and/or care coordination services.   Consent to Services:  The patient was given information about Chronic Care Management services, agreed to services, and gave verbal consent prior to initiation of services.  Please see initial visit note for detailed documentation.   Patient Care Team: Delsa Grana, PA-C as PCP - General (Family Medicine) Germaine Pomfret, [facility] (Pharmacist)  Recent office visits: 03/07/20: Patient presented to Clemetine Marker, LPN for AWV.  3/79/02: Patient presented to Delsa Grana, PA-C for follow-up. A1c improved slightly to 8.4%. Tamsulosin 0.4 mg daily started.   Recent consult visits: None in previous 6 months  Hospital visits: None in previous 6 months   Objective:  Lab Results  Component Value Date   CREATININE 0.85 02/23/2020   BUN 16 02/23/2020   GFRNONAA 85 02/23/2020   GFRAA 99 02/23/2020   NA 140 02/23/2020   K 3.8 02/23/2020   CALCIUM 9.7 02/23/2020   CO2 29 02/23/2020   GLUCOSE 169 (H) 02/23/2020    Lab Results  Component Value Date/Time   HGBA1C 8.4 (H) 02/23/2020 08:08 AM   HGBA1C 8.7 01/25/2020 12:00 AM   HGBA1C 7.6 (H) 08/23/2019 09:07 AM   MICROALBUR <0.2 10/21/2018 12:00 AM   MICROALBUR 0.9 08/11/2017 09:33 AM   MICROALBUR NEG 07/19/2015 10:20 AM   MICROALBUR 20 07/03/2014 03:32 PM    Last diabetic Eye exam:  Lab Results  Component Value Date/Time   HMDIABEYEEXA No Retinopathy 03/06/2019 12:00 AM    Last diabetic Foot exam: No results found for: HMDIABFOOTEX   Lab Results  Component Value Date    CHOL 103 02/23/2020   HDL 53 02/23/2020   LDLCALC 32 02/23/2020   TRIG 96 02/23/2020   CHOLHDL 1.9 02/23/2020    Hepatic Function Latest Ref Rng & Units 02/23/2020 08/23/2019 01/23/2019  Total Protein 6.1 - 8.1 g/dL 7.2 7.1 7.3  Albumin 3.6 - 5.1 g/dL - - -  AST 10 - 35 U/L '13 17 16  ' ALT 9 - 46 U/L '18 29 21  ' Alk Phosphatase 40 - 115 U/L - - -  Total Bilirubin 0.2 - 1.2 mg/dL 0.7 0.5 0.7    Lab Results  Component Value Date/Time   TSH 1.47 04/01/2016 12:07 PM   TSH 2.190 11/05/2014 11:04 AM    CBC Latest Ref Rng & Units 08/12/2019 10/21/2018 04/01/2016  WBC 4.0 - 10.5 K/uL 6.4 5.1 6.2  Hemoglobin 13.0 - 17.0 g/dL 15.0 16.0 14.7  Hematocrit 39.0 - 52.0 % 43.9 46.9 44.0  Platelets 150 - 400 K/uL 222 212 217    Lab Results  Component Value Date/Time   VD25OH 32.3 01/25/2020 12:00 AM   VD25OH 35 04/01/2016 12:07 PM    Clinical ASCVD: No  The ASCVD Risk score (Inman., et al., 2013) failed to calculate for the following reasons:   The valid total cholesterol range is 130 to 320 mg/dL    Depression screen [facility] 2/9 03/07/2020 02/23/2020 08/23/2019  Decreased Interest 0 0 0  Down, Depressed, Hopeless 0 0 0  PHQ - 2 Score 0  0 0  Altered sleeping - - 0  Tired, decreased energy - - 0  Change in appetite - - 0  Feeling bad or failure about yourself  - - 0  Trouble concentrating - - 0  Moving slowly or fidgety/restless - - 0  Suicidal thoughts - - 0  PHQ-9 Score - - 0  Difficult doing work/chores - - Not difficult at all  Some recent data might be hidden    Social History   Tobacco Use  Smoking Status Never Smoker  Smokeless Tobacco Former Systems developer  . Types: Snuff  Tobacco Comment   smoking cesation materials not required   BP Readings from Last 3 Encounters:  03/07/20 108/68  02/23/20 118/78  08/23/19 124/82   Pulse Readings from Last 3 Encounters:  03/07/20 71  02/23/20 87  08/23/19 81   Wt Readings from Last 3 Encounters:  03/07/20 161 lb 14.4 oz (73.4 kg)   02/23/20 157 lb 6.4 oz (71.4 kg)  08/23/19 167 lb 9.6 oz (76 kg)   BMI Readings from Last 3 Encounters:  03/07/20 26.94 kg/m  02/23/20 26.19 kg/m  08/23/19 27.89 kg/m    Assessment/Interventions: Review of patient past medical history, allergies, medications, health status, including review of consultants reports, laboratory and other test data, was performed as part of comprehensive evaluation and provision of chronic care management services.   SDOH:  (Social Determinants of Health) assessments and interventions performed: No SDOH Interventions   Flowsheet Row Most Recent Value  SDOH Interventions   Financial Strain Interventions Intervention Not Indicated     SDOH Screenings   Alcohol Screen: Low Risk   . Last Alcohol Screening Score (AUDIT): 0  Depression (PHQ2-9): Low Risk   . PHQ-2 Score: 0  Financial Resource Strain: Low Risk   . Difficulty of Paying Living Expenses: Not hard at all  Food Insecurity: No Food Insecurity  . Worried About Charity fundraiser in the Last Year: Never true  . Ran Out of Food in the Last Year: Never true  Housing: Low Risk   . Last Housing Risk Score: 0  Physical Activity: Sufficiently Active  . Days of Exercise per Week: 7 days  . Minutes of Exercise per Session: 30 min  Social Connections: Moderately Integrated  . Frequency of Communication with Friends and Family: More than three times a week  . Frequency of Social Gatherings with Friends and Family: Twice a week  . Attends Religious Services: More than 4 times per year  . Active Member of Clubs or Organizations: No  . Attends Archivist Meetings: Never  . Marital Status: Married  Stress: No Stress Concern Present  . Feeling of Stress : Not at all  Tobacco Use: Medium Risk  . Smoking Tobacco Use: Never Smoker  . Smokeless Tobacco Use: Former Soil scientist Needs: No Transportation Needs  . Lack of Transportation (Medical): No  . Lack of Transportation  (Non-Medical): No    CCM Care Plan  No Known Allergies  Medications Reviewed Today    Reviewed by Delsa Grana, PA-C (Physician Assistant Certified) on 27/51/70 at 1109  Med List Status: <None>  Medication Order Taking? Sig Documenting Provider Last Dose Status Informant  Cholecalciferol 25 MCG (1000 UT) tablet [id number]  Take 1 tablet by mouth daily. [provider]  Active   empagliflozin (JARDIANCE) 10 MG TABS tablet [id number]  Take 1 tablet (10 mg total) by mouth daily. Delsa Grana, PA-C  Active   finasteride (PROSCAR) 5 MG  tablet [id number]  Take 1 tablet (5 mg total) by mouth daily. Delsa Grana, PA-C  Active   gabapentin (NEURONTIN) 300 MG capsule [id number]  Take 1 capsule (300 mg total) by mouth 3 (three) times daily. Delsa Grana, PA-C  Active   glimepiride (AMARYL) 4 MG tablet [id number]  TAKE 1 TABLET BY MOUTH  DAILY WITH BREAKFAST, HOLD  IF BLOOD SUGAR <100 OR IF  NOT EATING Delsa Grana, PA-C  Active   glucose blood (ACCU-CHEK GUIDE) test strip [id number] No Use as instructed Delsa Grana, PA-C Not Taking Active   Lancets MISC [id number] No Use as directed to monitor blood sugar once daily. Dx: E11.65. Delsa Grana, PA-C Not Taking Active   losartan-hydrochlorothiazide (HYZAAR) 100-25 MG tablet [id number]  Take 1 tablet by mouth daily. For blood pressure Delsa Grana, PA-C  Active   metFORMIN (GLUCOPHAGE) 1000 MG tablet [id number]  TAKE 1 TABLET BY MOUTH  TWICE DAILY WITH A MEAL Delsa Grana, PA-C  Active   rosuvastatin (CRESTOR) 5 MG tablet [id number]  Take 1 tablet (5 mg total) by mouth at bedtime. Delsa Grana, PA-C  Active   sildenafil (VIAGRA) 50 MG tablet [id number] Yes Take 1-2 tablets (50-100 mg total) by mouth daily as needed for erectile dysfunction (take 30 min before sexual activity). Delsa Grana, PA-C Taking Active   tamsulosin (FLOMAX) 0.4 MG CAPS capsule [id number] Yes Take 1 capsule (0.4 mg total) by mouth daily. Delsa Grana, PA-C  Active           Patient  Active Problem List   Diagnosis Date Noted  . Other personal history presenting hazards to health 02/23/2020  . Vertigo 08/16/2019  . Pain due to onychomycosis of toenails of both feet 07/07/2018  . Encounter for screening colonoscopy   . Benign neoplasm of descending colon   . Polyp of sigmoid colon   . Benign neoplasm of ascending colon   . Penile irritation 03/24/2016  . Chronic post-operative pain 01/21/2016  . Lumbar spinal stenosis 04/22/2015  . Type 2 diabetes, controlled, with neuropathy ([place]) 11/05/2014  . Hypertension goal BP (blood pressure) < 140/90 11/05/2014  . Hyperlipidemia 11/05/2014  . Benign prostate hyperplasia 11/05/2014    Immunization History  Administered Date(s) Administered  . Fluad Quad(high Dose 65+) 10/19/2018  . Influenza,inj,Quad PF,6+ Mos 10/09/2015  . Influenza-Unspecified 10/12/2008, 10/02/2014, 10/16/2016, 10/07/2017, 09/27/2019, 10/22/2019  . Moderna Sars-Covid-2 Vaccination 02/24/2019, 03/27/2019, 11/14/2019  . Pneumococcal Conjugate-13 02/19/2017  . Pneumococcal Polysaccharide-23 11/30/2008, 06/17/2018  . Tdap 11/22/2009  . Zoster Recombinat (Shingrix) 05/24/2019, 07/26/2019    Conditions to be addressed/monitored:  Hypertension, Hyperlipidemia, Diabetes and BPH  Care Plan : General Pharmacy (Adult)  Updates made by Germaine Pomfret, RPH since 04/04/2020 12:00 AM    Problem: Hypertension, Hyperlipidemia, Diabetes and BPH   Priority: High    Long-Range Goal: Patient-Specific Goal   Start Date: 04/04/2020  Expected End Date: 10/05/2020  This Visit's Progress: On track  Priority: High  Note:   Current Barriers:  . Unable to achieve control of diabetes   Pharmacist Clinical Goal(s):  [name] Patient will achieve control of diabetes as evidenced by A1c less than 8% through collaboration with PharmD and provider.   Interventions: . 1:1 collaboration with Delsa Grana, PA-C regarding development and update of comprehensive plan of care as  evidenced by provider attestation and co-signature . Inter-disciplinary care team collaboration (see longitudinal plan of care) . Comprehensive medication review performed; medication list updated in electronic medical record  Hypertension (BP goal <140/90) -Controlled -Current  treatment: . Losartan-HCTZ 100-25 mg daily  -Medications previously tried: NA  -Current home readings: NA -Denies hypotensive/hypertensive symptoms -Educated on Daily salt intake goal < 2300 mg; Exercise goal of 150 minutes per week; -Counseled to monitor BP at home weekly, document, and provide log at future appointments -Recommended to continue current medication  Hyperlipidemia: (LDL goal < 70) -Controlled -Current treatment: . Rosuvastatin 5 mg nightly  -Medications previously tried: NA  -Educated on Importance of limiting foods high in cholesterol; -Recommended to continue current medication  Diabetes (A1c goal <8%) -Uncontrolled -Current medications: . Jardiance 10 mg daily  . Glimepiride 4 mg daily  . Metformin 1000 mg twice daily  -Medications previously tried: Januvia   -Current home glucose readings  Fasting Pre-dinner  24-Mar 166   20-Mar 163 133  12-Mar 153 117  11-Mar 172 132  10-Mar 163 134  9-Mar 164 165  8-Mar 170 220  Average 164 150   -Denies hypoglycemic/hyperglycemic symptoms -Current meal patterns:  . breakfast: Cereal + fruit + milk   . lunch: Varies   . dinner: Varies . snacks: Cut back on sweets, crackers + cheese, graham cracker + peanut butter  . drinks: water, will sometimes do vinegar sips  -Current exercise: Walking some days -Educated on A1c and blood sugar goals; Carbohydrate counting and/or plate method -Counseled to check feet daily and get yearly eye exams -Recommended to continue current medication  BPH (Goal: achieve symptom relief) -Controlled -Current treatment  . Finasteride 5 mg daily  . Tamsulosin 0.4 mg daily  -Medications previously tried:  NA - Patient reports frequent dizziness when looking up or when changing body position. Has been happening in the past week. Counseled patient to schedule follow-up with PCP if symptoms do not improve or worsen within the next month.   -Recommended to continue current medication   Patient Goals/Self-Care Activities . Patient will:  - check glucose twice daily (before breakfast and before supper), document, and provide at future appointments check blood pressure weekly, document, and provide at future appointments  Follow Up Plan: Telephone follow up appointment with care management team member scheduled for: 07/18/2020 at 1:00 PM      Medication Assistance: None required.  Patient affirms current coverage meets needs.  Patient's preferred pharmacy is:  [facility] [address] [place] - [place] [address] [place] [address] Phone: [phone] Fax: [phone]  [place] ([place], [place] [address] Phone: [phone] Fax: [phone]  Uses pill box? Yes Pt endorses 100% compliance  We discussed: Current pharmacy is preferred with insurance plan and patient is satisfied with pharmacy services Patient decided to: Continue current medication management strategy  Care Plan and Follow Up Patient Decision:  Patient agrees to Care Plan and Follow-up.  Plan: Telephone follow up appointment with care management team member scheduled for:  07/18/2020 at 1:00 PM  Doristine Section, [facility] [phone]

## 2020-04-04 NOTE — Patient Instructions (Signed)
Visit Information It was great speaking with you today!  Please let me know if you have any questions about our visit.  Goals Addressed            This Visit's Progress   . Monitor and Manage My Blood Sugar-Diabetes Type 2       Timeframe:  Long-Range Goal Priority:  High Start Date:   04/04/2020                           Expected End Date:   10/05/2020                    Follow Up Date 05/11/2020    - check blood sugar at prescribed times - enter blood sugar readings and medication or insulin into daily log - take the blood sugar log to all doctor visits    Why is this important?    Checking your blood sugar at home helps to keep it from getting very high or very low.   Writing the results in a diary or log helps the doctor know how to care for you.   Your blood sugar log should have the time, date and the results.   Also, write down the amount of insulin or other medicine that you take.   Other information, like what you ate, exercise done and how you were feeling, will also be helpful.     Notes:        Patient Care Plan: General Pharmacy (Adult)    Problem Identified: Hypertension, Hyperlipidemia, Diabetes and BPH   Priority: High    Long-Range Goal: Patient-Specific Goal   Start Date: 04/04/2020  Expected End Date: 10/05/2020  This Visit's Progress: On track  Priority: High  Note:   Current Barriers:  . Unable to achieve control of diabetes   Pharmacist Clinical Goal(s):  [name] Patient will achieve control of diabetes as evidenced by A1c less than 8% through collaboration with PharmD and provider.   Interventions: . 1:1 collaboration with Aaron Grana, Aaron Calhoun regarding development and update of comprehensive plan of care as evidenced by provider attestation and co-signature . Inter-disciplinary care team collaboration (see longitudinal plan of care) . Comprehensive medication review performed; medication list updated in electronic medical record  Hypertension (BP  goal <140/90) -Controlled -Current treatment: . Losartan-HCTZ 100-25 mg daily  -Medications previously tried: NA  -Current home readings: NA -Denies hypotensive/hypertensive symptoms -Educated on Daily salt intake goal < 2300 mg; Exercise goal of 150 minutes per week; -Counseled to monitor BP at home weekly, document, and provide log at future appointments -Recommended to continue current medication  Hyperlipidemia: (LDL goal < 70) -Controlled -Current treatment: . Rosuvastatin 5 mg nightly  -Medications previously tried: NA  -Educated on Importance of limiting foods high in cholesterol; -Recommended to continue current medication  Diabetes (A1c goal <8%) -Uncontrolled -Current medications: . Jardiance 10 mg daily  . Glimepiride 4 mg daily  . Metformin 1000 mg twice daily  -Medications previously tried: Januvia   -Current home glucose readings  Fasting Pre-dinner  24-Mar 166   20-Mar 163 133  12-Mar 153 117  11-Mar 172 132  10-Mar 163 134  9-Mar 164 165  8-Mar 170 220  Average 164 150   -Denies hypoglycemic/hyperglycemic symptoms -Current meal patterns:  . breakfast: Cereal + fruit + milk   . lunch: Varies   . dinner: Varies . snacks: Cut back on sweets, crackers + cheese, graham cracker +  peanut butter  . drinks: water, will sometimes do vinegar sips  -Current exercise: Walking some days -Educated on A1c and blood sugar goals; Carbohydrate counting and/or plate method -Counseled to check feet daily and get yearly eye exams -Recommended to continue current medication  BPH (Goal: achieve symptom relief) -Controlled -Current treatment  . Finasteride 5 mg daily  . Tamsulosin 0.4 mg daily  -Medications previously tried: NA - Patient reports frequent dizziness when looking up or when changing body position. Has been happening in the past week. Counseled patient to schedule follow-up with PCP if symptoms do not improve or worsen within the next month.    -Recommended to continue current medication   Patient Goals/Self-Care Activities . Patient will:  - check glucose twice daily (before breakfast and before supper), document, and provide at future appointments check blood pressure weekly, document, and provide at future appointments  Follow Up Plan: Telephone follow up appointment with care management team member scheduled for: 07/18/2020 at 1:00 PM    Patient agreed to services and verbal consent obtained.   The patient verbalized understanding of instructions, educational materials, and care plan provided today and declined offer to receive copy of patient instructions, educational materials, and care plan.   Aaron Calhoun, [place] [facility] [phone]

## 2020-04-22 DIAGNOSIS — R42 Dizziness and giddiness: Secondary | ICD-10-CM | POA: Diagnosis not present

## 2020-04-22 DIAGNOSIS — H8112 Benign paroxysmal vertigo, left ear: Secondary | ICD-10-CM | POA: Diagnosis not present

## 2020-04-23 ENCOUNTER — Telehealth: Payer: Self-pay

## 2020-04-23 NOTE — Progress Notes (Signed)
Chronic Care Management Pharmacy Assistant   Name: Jesselee Poth.  MRN: [id number] DOB: [birth year]  Reason for Encounter:Diabetes Disease State Call.   Recent office visits:  No recent Office visit  Recent consult visits:  No recent consult visit  Hospital visits:  None in previous 6 months  Medications: Outpatient Encounter Medications as of 04/23/2020  Medication Sig  . Cholecalciferol 25 MCG (1000 UT) tablet Take 1 tablet by mouth daily.  . empagliflozin (JARDIANCE) 10 MG TABS tablet Take 1 tablet (10 mg total) by mouth daily.  . finasteride (PROSCAR) 5 MG tablet Take 1 tablet (5 mg total) by mouth daily.  [name] gabapentin (NEURONTIN) 300 MG capsule Take 1 capsule (300 mg total) by mouth 3 (three) times daily.  [name] glimepiride (AMARYL) 4 MG tablet TAKE 1 TABLET BY MOUTH  DAILY WITH BREAKFAST, HOLD  IF BLOOD SUGAR &lt;100 OR IF  NOT EATING  . glucose blood (ACCU-CHEK GUIDE) test strip Use as instructed  . Lancets MISC Use as directed to monitor blood sugar once daily. Dx: E11.65.  [name] losartan-hydrochlorothiazide (HYZAAR) 100-25 MG tablet Take 1 tablet by mouth daily. For blood pressure  . metFORMIN (GLUCOPHAGE) 1000 MG tablet TAKE 1 TABLET BY MOUTH  TWICE DAILY WITH A MEAL  . rosuvastatin (CRESTOR) 5 MG tablet Take 1 tablet (5 mg total) by mouth at bedtime.  . sildenafil (VIAGRA) 50 MG tablet Take 1-2 tablets (50-100 mg total) by mouth daily as needed for erectile dysfunction (take 30 min before sexual activity).  . tamsulosin (FLOMAX) 0.4 MG CAPS capsule Take 1 capsule (0.4 mg total) by mouth daily.   No facility-administered encounter medications on file as of 04/23/2020.    Star Rating Drugs: Jardiance 10 mg last filled on 03/04/2020 for 90 day supply from [place] 4 mg last filled on 03/01/2020 for 90 day supply from [facility]. Losartan-HCTZ 100-25 mg last filled on 03/01/2020 for 90 day supply from [facility]. Metformin 1000 mg last filled on  03/01/2020 for 90 day supply from [facility]. Rosuvastatin 5 mg last filled on 03/01/2020 for 90 day supply from [employer].  Recent Relevant Labs: Lab Results  Component Value Date/Time   HGBA1C 8.4 (H) 02/23/2020 08:08 AM   HGBA1C 8.7 01/25/2020 12:00 AM   HGBA1C 7.6 (H) 08/23/2019 09:07 AM   MICROALBUR <0.2 10/21/2018 12:00 AM   MICROALBUR 0.9 08/11/2017 09:33 AM   MICROALBUR NEG 07/19/2015 10:20 AM   MICROALBUR 20 07/03/2014 03:32 PM    Kidney Function Lab Results  Component Value Date/Time   CREATININE 0.85 02/23/2020 08:08 AM   CREATININE 0.9 01/25/2020 12:00 AM   CREATININE 0.81 08/23/2019 09:07 AM   GFRNONAA 85 02/23/2020 08:08 AM   GFRAA 99 02/23/2020 08:08 AM    . Current antihyperglycemic regimen:   Jardiance 10 mg daily   Glimepiride 4 mg daily   Metformin 1000 mg twice daily  . What recent interventions/DTPs have been made to improve glycemic control:  o None ID . Have there been any recent hospitalizations or ED visits since last visit with CPP? No . Patient denies hypoglycemic symptoms, including Pale, Sweaty, Shaky, Hungry, Nervous/irritable and Vision changes . Patient denies hyperglycemic symptoms, including blurry vision, excessive thirst, fatigue, polyuria and weakness . How often are you checking your blood sugar?  o Patient states he checks his blood sugar 2 -3 times a week. . What are your blood sugars ranging?  o Fasting: n/a o Before meals:  - Patient states  his blood sugars range around 150-156 in the morning and 115 in the afternoon. o After meals: n/a o Bedtime: n/a . During the week, how often does your blood glucose drop below 70? Never . Are you checking your feet daily/regularly?   Patient denies numbness, pain ,or tingling sensation in his feet. Adherence Review: Is the patient currently on a STATIN medication? Yes Is the patient currently on ACE/ARB medication? Yes Does the patient have >5 day gap between last estimated  fill dates? No   Anderson [place] Clinical Production designer, theatre/television/film [phone]

## 2020-04-25 DIAGNOSIS — H8112 Benign paroxysmal vertigo, left ear: Secondary | ICD-10-CM | POA: Diagnosis not present

## 2020-05-09 ENCOUNTER — Telehealth: Payer: Self-pay

## 2020-05-09 NOTE — Progress Notes (Signed)
    Chronic Care Management Pharmacy Assistant   Name: Aaron Calhoun.  MRN: [id number] DOB: 1945/09/29  Reason for Encounter: Medication Review    Recent office visits:  N/A  Recent consult visits:  [place] [facility] visits:  None in previous 6 months  Medications: Outpatient Encounter Medications as of 05/09/2020  Medication Sig  . Cholecalciferol 25 MCG (1000 UT) tablet Take 1 tablet by mouth daily.  . empagliflozin (JARDIANCE) 10 MG TABS tablet Take 1 tablet (10 mg total) by mouth daily.  . finasteride (PROSCAR) 5 MG tablet Take 1 tablet (5 mg total) by mouth daily.  [name] gabapentin (NEURONTIN) 300 MG capsule Take 1 capsule (300 mg total) by mouth 3 (three) times daily.  [name] glimepiride (AMARYL) 4 MG tablet TAKE 1 TABLET BY MOUTH  DAILY WITH BREAKFAST, HOLD  IF BLOOD SUGAR &lt;100 OR IF  NOT EATING  . glucose blood (ACCU-CHEK GUIDE) test strip Use as instructed  . Lancets MISC Use as directed to monitor blood sugar once daily. Dx: E11.65.  [name] losartan-hydrochlorothiazide (HYZAAR) 100-25 MG tablet Take 1 tablet by mouth daily. For blood pressure  . metFORMIN (GLUCOPHAGE) 1000 MG tablet TAKE 1 TABLET BY MOUTH  TWICE DAILY WITH A MEAL  . rosuvastatin (CRESTOR) 5 MG tablet Take 1 tablet (5 mg total) by mouth at bedtime.  . sildenafil (VIAGRA) 50 MG tablet Take 1-2 tablets (50-100 mg total) by mouth daily as needed for erectile dysfunction (take 30 min before sexual activity).  . tamsulosin (FLOMAX) 0.4 MG CAPS capsule Take 1 capsule (0.4 mg total) by mouth daily.   No facility-administered encounter medications on file as of 05/09/2020.   Performed cost analysis for patient, estimated yearly medication cost around $ 64.00 .  [place] Pharmacist Assistant [phone]

## 2020-05-22 ENCOUNTER — Ambulatory Visit: Payer: HMO | Admitting: Family Medicine

## 2020-06-07 ENCOUNTER — Ambulatory Visit: Payer: HMO | Admitting: Family Medicine

## 2020-06-20 ENCOUNTER — Ambulatory Visit: Payer: Self-pay | Admitting: Family Medicine

## 2020-06-21 ENCOUNTER — Ambulatory Visit: Payer: Self-pay | Admitting: Family Medicine

## 2020-06-21 ENCOUNTER — Ambulatory Visit (INDEPENDENT_AMBULATORY_CARE_PROVIDER_SITE_OTHER): Payer: HMO | Admitting: Unknown Physician Specialty

## 2020-06-21 ENCOUNTER — Other Ambulatory Visit: Payer: Self-pay

## 2020-06-21 ENCOUNTER — Encounter: Payer: Self-pay | Admitting: Unknown Physician Specialty

## 2020-06-21 VITALS — BP 142/78 | HR 64 | Temp 98.2°F | Resp 16 | Ht 65.0 in | Wt 163.1 lb

## 2020-06-21 DIAGNOSIS — E1165 Type 2 diabetes mellitus with hyperglycemia: Secondary | ICD-10-CM | POA: Diagnosis not present

## 2020-06-21 DIAGNOSIS — E7800 Pure hypercholesterolemia, unspecified: Secondary | ICD-10-CM | POA: Diagnosis not present

## 2020-06-21 DIAGNOSIS — E1140 Type 2 diabetes mellitus with diabetic neuropathy, unspecified: Secondary | ICD-10-CM | POA: Diagnosis not present

## 2020-06-21 DIAGNOSIS — R351 Nocturia: Secondary | ICD-10-CM | POA: Diagnosis not present

## 2020-06-21 DIAGNOSIS — N401 Enlarged prostate with lower urinary tract symptoms: Secondary | ICD-10-CM

## 2020-06-21 DIAGNOSIS — G8928 Other chronic postprocedural pain: Secondary | ICD-10-CM

## 2020-06-21 DIAGNOSIS — I10 Essential (primary) hypertension: Secondary | ICD-10-CM

## 2020-06-21 DIAGNOSIS — IMO0002 Reserved for concepts with insufficient information to code with codable children: Secondary | ICD-10-CM

## 2020-06-21 LAB — POCT GLYCOSYLATED HEMOGLOBIN (HGB A1C): Hemoglobin A1C: 7.9 % — AB (ref 4.0–5.6)

## 2020-06-21 MED ORDER — GLIMEPIRIDE 4 MG PO TABS: ORAL_TABLET | ORAL | 1 refills | Status: DC

## 2020-06-21 MED ORDER — GABAPENTIN 300 MG PO CAPS: 300.0000 mg | ORAL_CAPSULE | Freq: Three times a day (TID) | ORAL | 1 refills | Status: DC

## 2020-06-21 MED ORDER — TAMSULOSIN HCL 0.4 MG PO CAPS: 0.4000 mg | ORAL_CAPSULE | Freq: Every day | ORAL | 3 refills | Status: DC

## 2020-06-21 MED ORDER — EMPAGLIFLOZIN 10 MG PO TABS: 10.0000 mg | ORAL_TABLET | Freq: Every day | ORAL | 0 refills | Status: DC

## 2020-06-21 MED ORDER — LOSARTAN POTASSIUM-HCTZ 100-25 MG PO TABS: 1.0000 | ORAL_TABLET | Freq: Every day | ORAL | 1 refills | Status: DC

## 2020-06-21 MED ORDER — METFORMIN HCL 1000 MG PO TABS: ORAL_TABLET | ORAL | 1 refills | Status: DC

## 2020-06-21 MED ORDER — FINASTERIDE 5 MG PO TABS: 5.0000 mg | ORAL_TABLET | Freq: Every day | ORAL | 3 refills | Status: DC

## 2020-06-21 MED ORDER — ROSUVASTATIN CALCIUM 5 MG PO TABS: 5.0000 mg | ORAL_TABLET | Freq: Every day | ORAL | 1 refills | Status: DC

## 2020-06-21 NOTE — Assessment & Plan Note (Addendum)
Hgb A1C is 7.9 today.  Not to goal but improved and out of meds for 2 weeks.  Will refill and recheck in 3 months.  Pt denies neuropathy as indicated on previous diagnosis

## 2020-06-21 NOTE — Assessment & Plan Note (Signed)
Not to goal in the office but better at home.  Continue present medications

## 2020-06-21 NOTE — Assessment & Plan Note (Signed)
LDL was 30 lst visit.  Continue present meds.  Labs also done at the [facility]

## 2020-06-21 NOTE — Progress Notes (Signed)
BP (!) 142/78   Pulse 64   Temp 98.2 F (36.8 C) (Oral)   Resp 16   Ht '5\' 5"'  (1.651 m)   Wt 163 lb 1.6 oz (74 kg)   SpO2 98%   BMI 27.14 kg/m    Subjective:    Patient ID: Aaron Simmonds., male    DOB: [birth year], 75 y.o.   MRN: [id number]  HPI: Aaron Granlund. is a 75 y.o. male  Chief Complaint  Patient presents with   Diabetes   Hyperlipidemia   Hypertension    Follow up, medication refills   Diabetes: Out of medications for 2 weeks.   No hypoglycemic episodes No hyperglycemic episodes Feet problems:none Blood Sugars averaging: eye exam within last year Last Hgb A1C: 8.4 but 7.9 today  Hypertension  Using medications without difficulty Average home BPs: top number 120s   Using medication without problems or lightheadedness No chest pain with exertion or shortness of breath No Edema  Elevated Cholesterol Using medications without problems No Muscle aches  Diet: Exercise:Doing well  Relevant past medical, surgical, family and social history reviewed and updated as indicated. Interim medical history since our last visit reviewed. Allergies and medications reviewed and updated.    Per HPI unless specifically indicated above     Objective:    BP (!) 142/78   Pulse 64   Temp 98.2 F (36.8 C) (Oral)   Resp 16   Ht '5\' 5"'  (1.651 m)   Wt 163 lb 1.6 oz (74 kg)   SpO2 98%   BMI 27.14 kg/m   Wt Readings from Last 3 Encounters:  06/21/20 163 lb 1.6 oz (74 kg)  03/07/20 161 lb 14.4 oz (73.4 kg)  02/23/20 157 lb 6.4 oz (71.4 kg)    Physical Exam Constitutional:      General: He is not in acute distress.    Appearance: Normal appearance. He is well-developed.  HENT:     Head: Normocephalic and atraumatic.  Eyes:     General: Lids are normal. No scleral icterus.       Right eye: No discharge.        Left eye: No discharge.     Conjunctiva/sclera: Conjunctivae normal.  Neck:     Vascular: No carotid bruit or JVD.  Cardiovascular:     Rate and  Rhythm: Normal rate and regular rhythm.     Heart sounds: Normal heart sounds.  Pulmonary:     Effort: Pulmonary effort is normal. No respiratory distress.     Breath sounds: Normal breath sounds.  Abdominal:     Palpations: There is no hepatomegaly or splenomegaly.  Musculoskeletal:        General: Normal range of motion.     Cervical back: Normal range of motion and neck supple.  Skin:    General: Skin is warm and dry.     Coloration: Skin is not pale.     Findings: No rash.  Neurological:     Mental Status: He is alert and oriented to person, place, and time.  Psychiatric:        Behavior: Behavior normal.        Thought Content: Thought content normal.        Judgment: Judgment normal.    Results for orders placed or performed in visit on 06/21/20  POCT HgB A1C  Result Value Ref Range   Hemoglobin A1C 7.9 (A) 4.0 - 5.6 %   HbA1c POC (<> result, manual entry)  HbA1c, POC (prediabetic range)     HbA1c, POC (controlled diabetic range)        Assessment & Plan:   Problem List Items Addressed This Visit       Unprioritized   Hyperlipidemia (Chronic)    LDL was 30 lst visit.  Continue present meds.  Labs also done at the [facility]       Relevant Medications   rosuvastatin (CRESTOR) 5 MG tablet   losartan-hydrochlorothiazide (HYZAAR) 100-25 MG tablet   Hypertension goal BP (blood pressure) < 140/90 (Chronic)    Not to goal in the office but better at home.  Continue present medications       Relevant Medications   rosuvastatin (CRESTOR) 5 MG tablet   losartan-hydrochlorothiazide (HYZAAR) 100-25 MG tablet   Benign prostate hyperplasia   Relevant Medications   finasteride (PROSCAR) 5 MG tablet   tamsulosin (FLOMAX) 0.4 MG CAPS capsule   Uncontrolled type 2 diabetes mellitus (HCC)    Hgb A1C is 7.9 today.  Not to goal but improved and out of meds for 2 weeks.  Will refill and recheck in 3 months.  Pt denies neuropathy as indicated on previous diagnosis        Relevant Medications   rosuvastatin (CRESTOR) 5 MG tablet   empagliflozin (JARDIANCE) 10 MG TABS tablet   losartan-hydrochlorothiazide (HYZAAR) 100-25 MG tablet   glimepiride (AMARYL) 4 MG tablet   metFORMIN (GLUCOPHAGE) 1000 MG tablet   Other Visit Diagnoses     Type 2 diabetes, uncontrolled, with neuropathy (HCC)    -  Primary   Relevant Medications   rosuvastatin (CRESTOR) 5 MG tablet   empagliflozin (JARDIANCE) 10 MG TABS tablet   losartan-hydrochlorothiazide (HYZAAR) 100-25 MG tablet   glimepiride (AMARYL) 4 MG tablet   metFORMIN (GLUCOPHAGE) 1000 MG tablet   Other Relevant Orders   POCT HgB A1C (Completed)   Type 2 diabetes, controlled, with neuropathy ([place])       has been well controlled, compliant with meds, due for labs, neuropathy wel controlled with gabapentin   Relevant Medications   rosuvastatin (CRESTOR) 5 MG tablet   empagliflozin (JARDIANCE) 10 MG TABS tablet   losartan-hydrochlorothiazide (HYZAAR) 100-25 MG tablet   glimepiride (AMARYL) 4 MG tablet   metFORMIN (GLUCOPHAGE) 1000 MG tablet   Chronic post-operative pain       Relevant Medications   gabapentin (NEURONTIN) 300 MG capsule   Uncontrolled type 2 diabetes with neuropathy (HCC)       Relevant Medications   rosuvastatin (CRESTOR) 5 MG tablet   empagliflozin (JARDIANCE) 10 MG TABS tablet   gabapentin (NEURONTIN) 300 MG capsule   losartan-hydrochlorothiazide (HYZAAR) 100-25 MG tablet   glimepiride (AMARYL) 4 MG tablet   metFORMIN (GLUCOPHAGE) 1000 MG tablet        Follow up plan: Return in about 3 months (around 09/21/2020).

## 2020-07-09 ENCOUNTER — Telehealth: Payer: Self-pay

## 2020-07-09 NOTE — Telephone Encounter (Signed)
Called wait time was more than 10 mins.  Rx's should be filled as writtien no year supply

## 2020-07-09 NOTE — Telephone Encounter (Signed)
Copied from [place] [phone]. Topic: General - Other >> Jul 09, 2020  9:33 AM Pawlus, Brayton Layman A wrote: Reason for CRM: Pharmacist was calling in to follow up on 8 prescriptions that were sent over on 6/10, pharmacist needed clarification on the instructions regarding the 90 day supply or year supply. Please call back.  Reference # [id number]

## 2020-07-17 ENCOUNTER — Telehealth: Payer: Self-pay

## 2020-07-17 NOTE — Chronic Care Management (AMB) (Signed)
    Chronic Care Management Pharmacy Assistant   Name: Aaron Calhoun.  MRN: [id number] DOB: [birth year]  Date- Patient called to remind of appointment with [name], CPP  on 07/18/2020 @ 1300.   No answer, left message of appointment date, time and type of appointment (either telephone or in person). Left message to have all medications, supplements, blood pressure and/or blood sugar logs available during appointment and to return call if need to reschedule.   Star Rating Drug: Rosuvastatin 5mg  last filled for a 90-Day supply on 07/07/2020 with [place] Jardiance 10 mg last filled on 06/24/2020 for a 90-Day supply with [place] 100-25 mg last filled for a 90-Day supply on 07/07/2020 with [place] Glimepiride 4 mg last filled on 07/10/2020 for a 90-Day supply with [place] Metformin 1000 mg last filled for a 90-Day supply on 07/07/2020 with [place]  Any gaps in medications fill history? None    Lynann Bologna, CPA/CMA Clinical Pharmacist Assistant Phone: [phone]

## 2020-07-18 ENCOUNTER — Ambulatory Visit (INDEPENDENT_AMBULATORY_CARE_PROVIDER_SITE_OTHER): Payer: HMO

## 2020-07-18 ENCOUNTER — Other Ambulatory Visit: Payer: Self-pay

## 2020-07-18 DIAGNOSIS — I152 Hypertension secondary to endocrine disorders: Secondary | ICD-10-CM

## 2020-07-18 DIAGNOSIS — E1159 Type 2 diabetes mellitus with other circulatory complications: Secondary | ICD-10-CM | POA: Diagnosis not present

## 2020-07-18 DIAGNOSIS — E1140 Type 2 diabetes mellitus with diabetic neuropathy, unspecified: Secondary | ICD-10-CM | POA: Diagnosis not present

## 2020-07-18 DIAGNOSIS — IMO0002 Reserved for concepts with insufficient information to code with codable children: Secondary | ICD-10-CM

## 2020-07-18 DIAGNOSIS — E1165 Type 2 diabetes mellitus with hyperglycemia: Secondary | ICD-10-CM | POA: Diagnosis not present

## 2020-07-18 NOTE — Patient Instructions (Addendum)
Visit Information It was great speaking with you today!  Please let me know if you have any questions about our visit.  Plan:  Check your blood pressure once weekly.  Check your blood sugar twice daily (Before breakfast and before supper)   Goals Addressed             This Visit's Progress    Monitor and Manage My Blood Sugar-Diabetes Type 2       Timeframe:  Long-Range Goal Priority:  High Start Date:   04/04/2020                           Expected End Date:   10/05/2021                    Follow Up Date 05/11/2021    - check blood sugar at prescribed times - enter blood sugar readings and medication or insulin into daily log - take the blood sugar log to all doctor visits    Why is this important?   Checking your blood sugar at home helps to keep it from getting very high or very low.  Writing the results in a diary or log helps the doctor know how to care for you.  Your blood sugar log should have the time, date and the results.  Also, write down the amount of insulin or other medicine that you take.  Other information, like what you ate, exercise done and how you were feeling, will also be helpful.     Notes:          Patient Care Plan: General Pharmacy (Adult)     Problem Identified: Hypertension, Hyperlipidemia, Diabetes and BPH   Priority: High     Long-Range Goal: Patient-Specific Goal   Start Date: 04/04/2020  Expected End Date: 10/05/2020  This Visit's Progress: On track  Recent Progress: On track  Priority: High  Note:   Current Barriers:  Unable to achieve control of diabetes   Pharmacist Clinical Goal(s):  Patient will achieve control of diabetes as evidenced by A1c less than 8% through collaboration with PharmD and provider.   Interventions: 1:1 collaboration with Delsa Grana, PA-C regarding development and update of comprehensive plan of care as evidenced by provider attestation and co-signature Inter-disciplinary care team collaboration (see  longitudinal plan of care) Comprehensive medication review performed; medication list updated in electronic medical record  Hypertension (BP goal <140/90) -Controlled -Current treatment: Losartan-HCTZ 100-25 mg daily  -Medications previously tried: NA  -Current home readings: 111/72 -Denies hypotensive/hypertensive symptoms -Educated on Daily salt intake goal < 2300 mg; Exercise goal of 150 minutes per week; -Counseled to monitor BP at home weekly, document, and provide log at future appointments -Recommended to continue current medication  Hyperlipidemia: (LDL goal < 70) -Controlled -Current treatment: Rosuvastatin 5 mg nightly  -Medications previously tried: NA  -Educated on Importance of limiting foods high in cholesterol; -Recommended to continue current medication  Diabetes (A1c goal <8%) -Uncontrolled -Current medications: Jardiance 10 mg daily  Glimepiride 4 mg daily  Metformin 1000 mg twice daily  -Medications previously tried: Januvia   -Current home glucose readings:  Fasting: 150-160s Pre-dinner: 110-120s -Denies hypoglycemic/hyperglycemic symptoms -Current meal patterns:  breakfast: Cereal + fruit + milk   lunch: Varies   dinner: Fish Sandwich from [employer] + 4 fries + water  snacks: Cut back on sweets, crackers + cheese, graham cracker + peanut butter  drinks: water, will sometimes do vinegar  sips  -Current exercise: Walking daily (20 minutes)  -Educated on A1c and blood sugar goals; Carbohydrate counting and/or plate method -Counseled to check feet daily and get yearly eye exams -Recommended to continue current medication  BPH (Goal: achieve symptom relief) -Controlled -Current treatment  Finasteride 5 mg daily  Tamsulosin 0.4 mg daily  -Medications previously tried: NA -Recommended to continue current medication  Patient Goals/Self-Care Activities Patient will:  - check glucose twice daily (before breakfast and before supper), document, and  provide at future appointments check blood pressure weekly, document, and provide at future appointments  Follow Up Plan: Telephone follow up appointment with care management team member scheduled for:  10/16/2020 at 10:00 AM    Patient agreed to services and verbal consent obtained.   Print copy of patient instructions, educational materials, and care plan provided in person. Telephone follow up appointment with pharmacy team member scheduled for: 10/16/2020 at 10:00 AM  Doristine Section, [facility] [phone]

## 2020-07-18 NOTE — Progress Notes (Signed)
Chronic Care Management Pharmacy Note  07/19/2020 Name:  Aaron Calhoun. MRN:  [id number] DOB:  [birth year]  Subjective:  Summary: Patient presents for follow-up. Patient thinks his blood sugars have been much improved recently as he has been focusing on eating less junk food and eating more fresh fruit.   Recommendations/Changes made from today's visit: Continue Current medications  Plan: CPP follow-up in 3 months   Rosevelt Luu. is an 75 y.o. year old male who is a primary patient of Delsa Grana, [place].  The CCM team was consulted for assistance with disease management and care coordination needs.    Engaged with patient by telephone for follow up visit in response to provider referral for pharmacy case management and/or care coordination services.   Consent to Services:  The patient was given information about Chronic Care Management services, agreed to services, and gave verbal consent prior to initiation of services.  Please see initial visit note for detailed documentation.   Patient Care Team: Delsa Grana, PA-C as PCP - General (Family Medicine) Germaine Pomfret, [facility] (Pharmacist)  Recent office visits: 06/21/20: Patient presented to Kathrine Haddock, NP for follow-up. A1c improved to 7.9%. Out of medications for 2 weeks.  03/07/20: Patient presented to Clemetine Marker, LPN for AWV.  4/58/09: Patient presented to Delsa Grana, PA-C for follow-up. A1c improved slightly to 8.4%. Tamsulosin 0.4 mg daily started.   Recent consult visits: None in previous 6 months  Hospital visits: None in previous 6 months   Objective:  Lab Results  Component Value Date   CREATININE 0.85 02/23/2020   BUN 16 02/23/2020   GFRNONAA 85 02/23/2020   GFRAA 99 02/23/2020   NA 140 02/23/2020   K 3.8 02/23/2020   CALCIUM 9.7 02/23/2020   CO2 29 02/23/2020   GLUCOSE 169 (H) 02/23/2020    Lab Results  Component Value Date/Time   HGBA1C 7.9 (A) 06/21/2020 07:49 AM   HGBA1C 8.4 (H)  02/23/2020 08:08 AM   HGBA1C 8.7 01/25/2020 12:00 AM   HGBA1C 7.6 (H) 08/23/2019 09:07 AM   MICROALBUR <0.2 10/21/2018 12:00 AM   MICROALBUR 0.9 08/11/2017 09:33 AM   MICROALBUR NEG 07/19/2015 10:20 AM   MICROALBUR 20 07/03/2014 03:32 PM    Last diabetic Eye exam:  Lab Results  Component Value Date/Time   HMDIABEYEEXA No Retinopathy 03/06/2019 12:00 AM    Last diabetic Foot exam: No results found for: HMDIABFOOTEX   Lab Results  Component Value Date   CHOL 103 02/23/2020   HDL 53 02/23/2020   LDLCALC 32 02/23/2020   TRIG 96 02/23/2020   CHOLHDL 1.9 02/23/2020    Hepatic Function Latest Ref Rng & Units 02/23/2020 08/23/2019 01/23/2019  Total Protein 6.1 - 8.1 g/dL 7.2 7.1 7.3  Albumin 3.6 - 5.1 g/dL - - -  AST 10 - 35 U/L _0 ALT 9 - 46 U/L _1 Alk Phosphatase 40 - 115 U/L - - -  Total Bilirubin 0.2 - 1.2 mg/dL 0.7 0.5 0.7    Lab Results  Component Value Date/Time   TSH 1.47 04/01/2016 12:07 PM   TSH 2.190 11/05/2014 11:04 AM    CBC Latest Ref Rng & Units 08/12/2019 10/21/2018 04/01/2016  WBC 4.0 - 10.5 K/uL 6.4 5.1 6.2  Hemoglobin 13.0 - 17.0 g/dL 15.0 16.0 14.7  Hematocrit 39.0 - 52.0 % 43.9 46.9 44.0  Platelets 150 - 400 K/uL 222 212 217    Lab Results  Component Value Date/Time  VD25OH 32.3 01/25/2020 12:00 AM   VD25OH 35 04/01/2016 12:07 PM    Clinical ASCVD: No  The ASCVD Risk score Mikey Bussing DC Jr., et al., 2013) failed to calculate for the following reasons:   The valid total cholesterol range is 130 to 320 mg/dL    Depression screen [facility] 2/9 06/21/2020 03/07/2020 02/23/2020  Decreased Interest 0 0 0  Down, Depressed, Hopeless 0 0 0  PHQ - 2 Score 0 0 0  Altered sleeping - - -  Tired, decreased energy - - -  Change in appetite - - -  Feeling bad or failure about yourself  - - -  Trouble concentrating - - -  Moving slowly or fidgety/restless - - -  Suicidal thoughts - - -  PHQ-9 Score - - -  Difficult doing work/chores - - -  Some recent data  might be hidden    Social History   Tobacco Use  Smoking Status Never  Smokeless Tobacco Former   Types: Snuff   Quit date: 1980  Tobacco Comments   smoking cesation materials not required   BP Readings from Last 3 Encounters:  06/21/20 (!) 142/78  03/07/20 108/68  02/23/20 118/78   Pulse Readings from Last 3 Encounters:  06/21/20 64  03/07/20 71  02/23/20 87   Wt Readings from Last 3 Encounters:  06/21/20 163 lb 1.6 oz (74 kg)  03/07/20 161 lb 14.4 oz (73.4 kg)  02/23/20 157 lb 6.4 oz (71.4 kg)   BMI Readings from Last 3 Encounters:  06/21/20 27.14 kg/m  03/07/20 26.94 kg/m  02/23/20 26.19 kg/m    Assessment/Interventions: Review of patient past medical history, allergies, medications, health status, including review of consultants reports, laboratory and other test data, was performed as part of comprehensive evaluation and provision of chronic care management services.   SDOH:  (Social Determinants of Health) assessments and interventions performed: No   SDOH Screenings   Alcohol Screen: Low Risk    Last Alcohol Screening Score (AUDIT): 0  Depression (PHQ2-9): Low Risk    PHQ-2 Score: 0  Financial Resource Strain: Low Risk    Difficulty of Paying Living Expenses: Not hard at all  Food Insecurity: No Food Insecurity   Worried About Charity fundraiser in the Last Year: Never true   Ran Out of Food in the Last Year: Never true  Housing: Low Risk    Last Housing Risk Score: 0  Physical Activity: Sufficiently Active   Days of Exercise per Week: 7 days   Minutes of Exercise per Session: 30 min  Social Connections: Moderately Integrated   Frequency of Communication with Friends and Family: More than three times a week   Frequency of Social Gatherings with Friends and Family: Twice a week   Attends Religious Services: More than 4 times per year   Active Member of [employer] or Organizations: No   Attends Music therapist: Never   Marital Status:  Married  Stress: No Stress Concern Present   Feeling of Stress : Not at all  Tobacco Use: Medium Risk   Smoking Tobacco Use: Never   Smokeless Tobacco Use: Former  Transport planner Needs: No Data processing manager (Medical): No   Lack of Transportation (Non-Medical): No    CCM Care Plan  No Known Allergies  Medications Reviewed Today     Reviewed by Hollie Salk, RMA (Registered Medical Assistant) on 06/21/20 at [phone]  Med List Status: <None>   Medication Order Taking? Sig  Documenting Provider Last Dose Status Informant  Cholecalciferol 25 MCG (1000 UT) tablet [id number] Yes Take 1 tablet by mouth daily. [provider] Taking Active   empagliflozin (JARDIANCE) 10 MG TABS tablet [id number] Yes Take 1 tablet (10 mg total) by mouth daily. Delsa Grana, PA-C Taking Active   finasteride (PROSCAR) 5 MG tablet [id number] Yes Take 1 tablet (5 mg total) by mouth daily. Delsa Grana, PA-C Taking Active   gabapentin (NEURONTIN) 300 MG capsule [id number] Yes Take 1 capsule (300 mg total) by mouth 3 (three) times daily. Delsa Grana, PA-C Taking Active   glimepiride (AMARYL) 4 MG tablet [id number] Yes TAKE 1 TABLET BY MOUTH  DAILY WITH BREAKFAST, HOLD  IF BLOOD SUGAR &lt;100 OR IF  NOT EATING Delsa Grana, PA-C Taking Active   glucose blood (ACCU-CHEK GUIDE) test strip [id number] No Use as instructed  Patient not taking: Reported on 06/21/2020   Delsa Grana, PA-C Not Taking Active   Lancets MISC [id number] No Use as directed to monitor blood sugar once daily. Dx: E11.65.  Patient not taking: Reported on 06/21/2020   Delsa Grana, PA-C Not Taking Active   losartan-hydrochlorothiazide [facility]) 100-25 MG tablet [id number] Yes Take 1 tablet by mouth daily. For blood pressure Delsa Grana, PA-C Taking Active   metFORMIN (GLUCOPHAGE) 1000 MG tablet [id number] Yes TAKE 1 TABLET BY MOUTH  TWICE DAILY WITH A MEAL Delsa Grana, PA-C Taking Active   rosuvastatin (CRESTOR) 5 MG tablet  [id number] Yes Take 1 tablet (5 mg total) by mouth at bedtime. Delsa Grana, PA-C Taking Active   sildenafil (VIAGRA) 50 MG tablet [id number] Yes Take 1-2 tablets (50-100 mg total) by mouth daily as needed for erectile dysfunction (take 30 min before sexual activity). Delsa Grana, PA-C Taking Active   tamsulosin (FLOMAX) 0.4 MG CAPS capsule [id number] Yes Take 1 capsule (0.4 mg total) by mouth daily. Delsa Grana, PA-C Taking Active             Patient Active Problem List   Diagnosis Date Noted   Other personal history presenting hazards to health 02/23/2020   Vertigo 08/16/2019   Penile irritation 03/24/2016   Lumbar spinal stenosis 04/22/2015   Uncontrolled type 2 diabetes mellitus ([place]) 11/05/2014   Hypertension goal BP (blood pressure) < 140/90 11/05/2014   Hyperlipidemia 11/05/2014   Benign prostate hyperplasia 11/05/2014    Immunization History  Administered Date(s) Administered   Fluad Quad(high Dose 65+) 10/19/2018   Influenza,inj,Quad PF,6+ Mos 10/09/2015   Influenza-Unspecified 10/12/2008, 10/02/2014, 10/16/2016, 10/07/2017, 09/27/2019, 10/22/2019   Moderna Sars-Covid-2 Vaccination 02/24/2019, 03/27/2019, 11/14/2019, 04/15/2020   Pneumococcal Conjugate-13 02/19/2017   Pneumococcal Polysaccharide-23 11/30/2008, 06/17/2018   Tdap 11/22/2009   Zoster Recombinat (Shingrix) 05/24/2019, 07/26/2019    Conditions to be addressed/monitored:  Hypertension, Hyperlipidemia, Diabetes and BPH  Care Plan : General Pharmacy (Adult)  Updates made by Germaine Pomfret, RPH since 07/19/2020 12:00 AM     Problem: Hypertension, Hyperlipidemia, Diabetes and BPH   Priority: High     Long-Range Goal: Patient-Specific Goal   Start Date: 04/04/2020  Expected End Date: 10/05/2020  This Visit's Progress: On track  Recent Progress: On track  Priority: High  Note:   Current Barriers:  Unable to achieve control of diabetes   Pharmacist Clinical Goal(s):  Patient will achieve control of  diabetes as evidenced by A1c less than 8% through collaboration with PharmD and provider.   Interventions: 1:1 collaboration with Delsa Grana, PA-C regarding development and update of comprehensive plan of care as evidenced by provider  attestation and co-signature Inter-disciplinary care team collaboration (see longitudinal plan of care) Comprehensive medication review performed; medication list updated in electronic medical record  Hypertension (BP goal <140/90) -Controlled -Current treatment: Losartan-HCTZ 100-25 mg daily  -Medications previously tried: NA  -Current home readings: 111/72 -Denies hypotensive/hypertensive symptoms -Educated on Daily salt intake goal < 2300 mg; Exercise goal of 150 minutes per week; -Counseled to monitor BP at home weekly, document, and provide log at future appointments -Recommended to continue current medication  Hyperlipidemia: (LDL goal < 70) -Controlled -Current treatment: Rosuvastatin 5 mg nightly  -Medications previously tried: NA  -Educated on Importance of limiting foods high in cholesterol; -Recommended to continue current medication  Diabetes (A1c goal <8%) -Uncontrolled -Current medications: Jardiance 10 mg daily  Glimepiride 4 mg daily  Metformin 1000 mg twice daily  -Medications previously tried: Januvia   -Current home glucose readings:  Fasting: 150-160s Pre-dinner: 110-120s -Denies hypoglycemic/hyperglycemic symptoms -Current meal patterns:  breakfast: Cereal + fruit + milk   lunch: Varies   dinner: Fish Sandwich from [employer] + 4 fries + water  snacks: Cut back on sweets, crackers + cheese, graham cracker + peanut butter  drinks: water, will sometimes do vinegar sips  -Current exercise: Walking daily (20 minutes)  -Educated on A1c and blood sugar goals; Carbohydrate counting and/or plate method -Counseled to check feet daily and get yearly eye exams -Recommended to continue current medication  BPH (Goal: achieve  symptom relief) -Controlled -Current treatment  Finasteride 5 mg daily  Tamsulosin 0.4 mg daily  -Medications previously tried: NA -Recommended to continue current medication  Patient Goals/Self-Care Activities Patient will:  - check glucose twice daily (before breakfast and before supper), document, and provide at future appointments check blood pressure weekly, document, and provide at future appointments  Follow Up Plan: Telephone follow up appointment with care management team member scheduled for:  10/16/2020 at 10:00 AM       Medication Assistance: None required.  Patient affirms current coverage meets needs.  Patient's preferred pharmacy is:  [facility] [address], [place] - [place] [address] [place] [address] Phone: [phone] Fax: [phone]  [place] ([place], [place] [address] Phone: [phone] Fax: [phone]  Uses pill box? Yes Pt endorses 100% compliance  We discussed: Current pharmacy is preferred with insurance plan and patient is satisfied with pharmacy services Patient decided to: Continue current medication management strategy  Care Plan and Follow Up Patient Decision:  Patient agrees to Care Plan and Follow-up.  Plan: Telephone follow up appointment with care management team member scheduled for:  10/16/2020 at 10:00 AM  Doristine Section, [facility] [phone]

## 2020-07-22 DIAGNOSIS — U071 COVID-19: Secondary | ICD-10-CM | POA: Diagnosis not present

## 2020-07-22 DIAGNOSIS — Z20822 Contact with and (suspected) exposure to covid-19: Secondary | ICD-10-CM | POA: Diagnosis not present

## 2020-08-29 ENCOUNTER — Telehealth: Payer: Self-pay

## 2020-08-29 NOTE — Progress Notes (Signed)
Chronic Care Management Pharmacy Assistant   Name: Aaron Calhoun.  MRN: [id number] DOB: May 23, 1945  Reason for Encounter: Diabetes Disease State Call   Recent office visits:  None ID  Recent consult visits:  None ID  Hospital visits:  None in previous 6 months  Medications: Outpatient Encounter Medications as of 08/29/2020  Medication Sig   Cholecalciferol 25 MCG (1000 UT) tablet Take 1 tablet by mouth daily.   empagliflozin (JARDIANCE) 10 MG TABS tablet Take 1 tablet (10 mg total) by mouth daily.   finasteride (PROSCAR) 5 MG tablet Take 1 tablet (5 mg total) by mouth daily.   gabapentin (NEURONTIN) 300 MG capsule Take 1 capsule (300 mg total) by mouth 3 (three) times daily.   glimepiride (AMARYL) 4 MG tablet TAKE 1 TABLET BY MOUTH  DAILY WITH BREAKFAST, HOLD  IF BLOOD SUGAR &lt;100 OR IF  NOT EATING   glucose blood (ACCU-CHEK GUIDE) test strip Use as instructed (Patient not taking: Reported on 06/21/2020)   Lancets MISC Use as directed to monitor blood sugar once daily. Dx: E11.65. (Patient not taking: Reported on 06/21/2020)   losartan-hydrochlorothiazide (HYZAAR) 100-25 MG tablet Take 1 tablet by mouth daily. For blood pressure   metFORMIN (GLUCOPHAGE) 1000 MG tablet TAKE 1 TABLET BY MOUTH  TWICE DAILY WITH A MEAL   rosuvastatin (CRESTOR) 5 MG tablet Take 1 tablet (5 mg total) by mouth at bedtime.   sildenafil (VIAGRA) 50 MG tablet Take 1-2 tablets (50-100 mg total) by mouth daily as needed for erectile dysfunction (take 30 min before sexual activity).   tamsulosin (FLOMAX) 0.4 MG CAPS capsule Take 1 capsule (0.4 mg total) by mouth daily.   No facility-administered encounter medications on file as of 08/29/2020.   Care Gaps: Influenza Vaccine  Star Rating Drugs: Rosuvastatin 5 mg last filled on 07/10/2020 for a 90-Day supply with [place] Jardiance 10 mg last filled on 06/24/2020 for a 90-Day supply with [facility] Losartan-HCTZ 100-25  mg last filled on 07/10/2020 for a 90-Day supply with [place] Glimepiride 4 mg last filled on 07/10/2020 for a 90-Day supply with [place] Metformin 1000 mg last filled on 07/10/2020 for a 90-Day supply with [facility]   Recent Relevant Labs: Lab Results  Component Value Date/Time   HGBA1C 7.9 (A) 06/21/2020 07:49 AM   HGBA1C 8.4 (H) 02/23/2020 08:08 AM   HGBA1C 8.7 01/25/2020 12:00 AM   HGBA1C 7.6 (H) 08/23/2019 09:07 AM   MICROALBUR <0.2 10/21/2018 12:00 AM   MICROALBUR 0.9 08/11/2017 09:33 AM   MICROALBUR NEG 07/19/2015 10:20 AM   MICROALBUR 20 07/03/2014 03:32 PM    Kidney Function Lab Results  Component Value Date/Time   CREATININE 0.85 02/23/2020 08:08 AM   CREATININE 0.9 01/25/2020 12:00 AM   CREATININE 0.81 08/23/2019 09:07 AM   GFRNONAA 85 02/23/2020 08:08 AM   GFRAA 99 02/23/2020 08:08 AM    Current antihyperglycemic regimen:  Jardiance 10 mg daily  Glimepiride 4 mg daily  Metformin 1000 mg twice daily   What recent interventions/DTPs have been made to improve glycemic control:  None ID  Have there been any recent hospitalizations or ED visits since last visit with CPP? No  Patient denies hypoglycemic symptoms, including Pale, Sweaty, Shaky, Hungry, Nervous/irritable, and Vision changes  Patient denies hyperglycemic symptoms, including blurry vision, excessive thirst, fatigue, polyuria, and weakness  How often are you checking your blood sugar? twice daily  What are  your blood sugars ranging?  Last 2 numbers he provided was before a meal and they were 110 & 120  During the week, how often does your blood glucose drop below 70? Never  Are you checking your feet daily/regularly? Yes  Adherence Review: Is the patient currently on a STATIN medication? Yes Is the patient currently on ACE/ARB medication? Yes Does the patient have >5 day gap between last estimated fill dates? No   Patient states his  appetite is fine, and he still is walking daily. He denies needing any refills on any of his medications at this time and denies having any side effects at this time. Patient reports that he has been doing well and denies any type of issues.  Patient has upcoming appointment with Aaron Calhoun, CPP on 10/16/2020 @ 1000 AWV completed on 03/07/2020    Lynann Bologna, CPA/CMA Clinical Pharmacist Assistant Phone: [phone]

## 2020-09-12 ENCOUNTER — Telehealth: Payer: Self-pay

## 2020-09-12 DIAGNOSIS — E1140 Type 2 diabetes mellitus with diabetic neuropathy, unspecified: Secondary | ICD-10-CM

## 2020-09-12 MED ORDER — EMPAGLIFLOZIN 10 MG PO TABS: 10.0000 mg | ORAL_TABLET | Freq: Every day | ORAL | 0 refills | Status: DC

## 2020-09-12 NOTE — Telephone Encounter (Signed)
Pt requesting a refill on Jardiance '10mg'$ . He have about a week worth left. Please send to [facility]

## 2020-09-20 ENCOUNTER — Ambulatory Visit: Payer: HMO | Admitting: Family Medicine

## 2020-09-20 DIAGNOSIS — E1165 Type 2 diabetes mellitus with hyperglycemia: Secondary | ICD-10-CM

## 2020-09-20 DIAGNOSIS — E7800 Pure hypercholesterolemia, unspecified: Secondary | ICD-10-CM

## 2020-09-20 DIAGNOSIS — N401 Enlarged prostate with lower urinary tract symptoms: Secondary | ICD-10-CM

## 2020-09-20 DIAGNOSIS — I152 Hypertension secondary to endocrine disorders: Secondary | ICD-10-CM

## 2020-09-20 DIAGNOSIS — I10 Essential (primary) hypertension: Secondary | ICD-10-CM

## 2020-09-24 ENCOUNTER — Other Ambulatory Visit: Payer: Self-pay

## 2020-09-24 ENCOUNTER — Encounter: Payer: Self-pay | Admitting: Family Medicine

## 2020-09-24 ENCOUNTER — Ambulatory Visit (INDEPENDENT_AMBULATORY_CARE_PROVIDER_SITE_OTHER): Payer: HMO | Admitting: Family Medicine

## 2020-09-24 VITALS — BP 122/62 | HR 70 | Temp 98.1°F | Resp 16 | Ht 65.0 in | Wt 154.6 lb

## 2020-09-24 DIAGNOSIS — E1165 Type 2 diabetes mellitus with hyperglycemia: Secondary | ICD-10-CM

## 2020-09-24 DIAGNOSIS — I10 Essential (primary) hypertension: Secondary | ICD-10-CM

## 2020-09-24 LAB — POCT GLYCOSYLATED HEMOGLOBIN (HGB A1C): Hemoglobin A1C: 7.7 % — AB (ref 4.0–5.6)

## 2020-09-24 NOTE — Assessment & Plan Note (Signed)
A1c at goal for age, 7.7 today. Some reported lows though not often, no hypoglycemic sx reported. Continue to monitor on current regimen. F/u in 3 months.

## 2020-09-24 NOTE — Assessment & Plan Note (Signed)
At goal, no changes. Some low normal BP measurements at home though without orthostatic sx, continue to monitor on current regimen.

## 2020-09-24 NOTE — Progress Notes (Signed)
   SUBJECTIVE:   CHIEF COMPLAINT / HPI:   Hypertension: - Medications: losartan-HCTZ - Compliance: good - Checking BP at home: yes, 110-120s SBP - Denies any SOB, CP, vision changes, LE edema, medication SEs, or symptoms of hypotension  Diabetes, Type 2 - Last A1c 7.9 06/2020 - Medications: jardiance, glimepiride, metformin - Compliance: good - Checking BG at home: occasionally, 182 this am. Usually around 112-160. Lowest 86. - Diet: 2 meals with snacks. Eats fruits, vegetables.  - Exercise: walking - Eye exam: UTD - Foot exam: UTD - Microalbumin: UTD - Statin: yes - PNA vaccine: UTD - with neuropathy on gabapentin   OBJECTIVE:   BP 122/62   Pulse 70   Temp 98.1 F (36.7 C)   Resp 16   Ht '5\' 5"'$  (1.651 m)   Wt 154 lb 9.6 oz (70.1 kg)   SpO2 97%   BMI 25.73 kg/m   Gen: well appearing, in NAD Card: RRR Lungs: CTAB Ext: WWP, no edema   ASSESSMENT/PLAN:   Hypertension goal BP (blood pressure) < 140/90 At goal, no changes. Some low normal BP measurements at home though without orthostatic sx, continue to monitor on current regimen.  Uncontrolled type 2 diabetes mellitus (HCC) A1c at goal for age, 7.7 today. Some reported lows though not often, no hypoglycemic sx reported. Continue to monitor on current regimen. F/u in 3 months.     Myles Gip, DO

## 2020-09-24 NOTE — Patient Instructions (Signed)
It was great to see you!  Our plans for today:  - No changes to your medications today.  - Continue to keep an eye on your blood sugars. When you take your blood sugar, write down your values and bring them with you to your next visit.  - Come back in 3 months.   We are checking some labs today, we will release these results to your MyChart.  Take care and seek immediate care sooner if you develop any concerns.   Dr. Ky Barban

## 2020-09-25 LAB — BASIC METABOLIC PANEL
BUN: 14 mg/dL (ref 7–25)
CO2: 27 mmol/L (ref 20–32)
Calcium: 9.6 mg/dL (ref 8.6–10.3)
Chloride: 103 mmol/L (ref 98–110)
Creat: 0.8 mg/dL (ref 0.70–1.28)
Glucose, Bld: 162 mg/dL — ABNORMAL HIGH (ref 65–99)
Potassium: 3.8 mmol/L (ref 3.5–5.3)
Sodium: 141 mmol/L (ref 135–146)

## 2020-10-02 ENCOUNTER — Ambulatory Visit: Payer: Medicare Other

## 2020-10-02 ENCOUNTER — Other Ambulatory Visit: Payer: Self-pay

## 2020-10-02 DIAGNOSIS — Z23 Encounter for immunization: Secondary | ICD-10-CM

## 2020-10-15 ENCOUNTER — Telehealth: Payer: Self-pay

## 2020-10-15 NOTE — Progress Notes (Signed)
    Chronic Care Management Pharmacy Assistant   Name: Aaron Calhoun.  MRN: [id number] DOB: 1945-03-23  Patient called to be reminded of his appointment with [name], CPP on 10/16/2020 @1000  via telephone.  Patient aware of appointment date, time, and type of appointment (either telephone or in person). Patient aware to have/bring all medications, supplements, blood pressure and/or blood sugar logs to visit.  Questions: Are there any concerns you would like to discuss during your office visit? Nothing he can think of at this time.  Are you having any problems obtaining your medications? (Whether it pharmacy issues or cost) No  Star Rating Drug: Jardiance 10 mg last filled on 06/24/2020 for a 90-Day supply with [facility] Rosuvastatin 5 mg last filled on 09/17/2020 for a 90-Day supply with [place] 100-25 mg last filled on 09/17/2020 for a 90-Day supply with [place] Glimepiride 4 mg last filled on 09/17/2020 for a 90-Day supply with [place] Metformin 1000 mg last filled on 09/17/2020 for a 90-Day supply with [place]   Any gaps in medications fill history? Yes  Care Gaps: None ID   Lynann Bologna, CPA/CMA Clinical Pharmacist Assistant Phone: [phone]

## 2020-10-16 ENCOUNTER — Ambulatory Visit (INDEPENDENT_AMBULATORY_CARE_PROVIDER_SITE_OTHER): Payer: HMO

## 2020-10-16 DIAGNOSIS — E7800 Pure hypercholesterolemia, unspecified: Secondary | ICD-10-CM

## 2020-10-16 DIAGNOSIS — E1140 Type 2 diabetes mellitus with diabetic neuropathy, unspecified: Secondary | ICD-10-CM

## 2020-10-16 NOTE — Progress Notes (Signed)
Chronic Care Management Pharmacy Note  10/16/2020 Name:  Aaron Calhoun. MRN:  [id number] DOB:  1945/08/13  Subjective:  Summary: Patient presents for follow-up. He reports doing well, but he has been in the [facility]. He has been out of Jardiance for one month. He is only monitoring his blood sugars sporadically, but they have been elevated in the morning.   Recommendations/Changes made from today's visit: Continue Current medications -Will see about samples for Jardiance and start PAP  Plan: CPP follow-up in 3 months   Recommended Problem List Changes:   Modify:  Uncontrolled T2DM to Type 2 diabetes mellitus with diabetic polyneuropathy, without long-term current use of insulin E11.42   HTN to Hypertension associated with type 2 diabetes mellitus (HCC)   HLD to Hyperlipidemia associated with type 2 diabetes mellitus (HCC)  Aaron Calhoun. is an 75 y.o. year old male who is a primary patient of Delsa Grana, [place].  The CCM team was consulted for assistance with disease management and care coordination needs.    Engaged with patient by telephone for follow up visit in response to provider referral for pharmacy case management and/or care coordination services.   Consent to Services:  The patient was given information about Chronic Care Management services, agreed to services, and gave verbal consent prior to initiation of services.  Please see initial visit note for detailed documentation.   Patient Care Team: Delsa Grana, PA-C as PCP - General (Family Medicine) Germaine Pomfret, [facility] (Pharmacist)  Recent office visits: 09/24/20: Patient presented to Dr. Ky Barban for follow-up. A1c improved to 7.7%.  06/21/20: Patient presented to Kathrine Haddock, NP for follow-up. A1c improved to 7.9%. Out of medications for 2 weeks.  03/07/20: Patient presented to Clemetine Marker, LPN for AWV.  4/65/03: Patient presented to Delsa Grana, PA-C for follow-up. A1c improved slightly to 8.4%.  Tamsulosin 0.4 mg daily started.   Recent consult visits: None in previous 6 months  Hospital visits: None in previous 6 months   Objective:  Lab Results  Component Value Date   CREATININE 0.80 09/24/2020   BUN 14 09/24/2020   GFRNONAA 85 02/23/2020   GFRAA 99 02/23/2020   NA 141 09/24/2020   K 3.8 09/24/2020   CALCIUM 9.6 09/24/2020   CO2 27 09/24/2020   GLUCOSE 162 (H) 09/24/2020    Lab Results  Component Value Date/Time   HGBA1C 7.7 (A) 09/24/2020 09:00 AM   HGBA1C 7.9 (A) 06/21/2020 07:49 AM   HGBA1C 8.4 (H) 02/23/2020 08:08 AM   HGBA1C 8.7 01/25/2020 12:00 AM   HGBA1C 7.6 (H) 08/23/2019 09:07 AM   MICROALBUR <0.2 10/21/2018 12:00 AM   MICROALBUR 0.9 08/11/2017 09:33 AM   MICROALBUR NEG 07/19/2015 10:20 AM   MICROALBUR 20 07/03/2014 03:32 PM    Last diabetic Eye exam:  Lab Results  Component Value Date/Time   HMDIABEYEEXA No Retinopathy 03/06/2019 12:00 AM    Last diabetic Foot exam: No results found for: HMDIABFOOTEX   Lab Results  Component Value Date   CHOL 103 02/23/2020   HDL 53 02/23/2020   LDLCALC 32 02/23/2020   TRIG 96 02/23/2020   CHOLHDL 1.9 02/23/2020    Hepatic Function Latest Ref Rng & Units 02/23/2020 08/23/2019 01/23/2019  Total Protein 6.1 - 8.1 g/dL 7.2 7.1 7.3  Albumin 3.6 - 5.1 g/dL - - -  AST 10 - 35 U/L '13 17 16  ' ALT 9 - 46 U/L '18 29 21  ' Alk Phosphatase 40 - 115 U/L - - -  Total Bilirubin 0.2 - 1.2 mg/dL 0.7 0.5 0.7    Lab Results  Component Value Date/Time   TSH 1.47 04/01/2016 12:07 PM   TSH 2.190 11/05/2014 11:04 AM    CBC Latest Ref Rng & Units 08/12/2019 10/21/2018 04/01/2016  WBC 4.0 - 10.5 K/uL 6.4 5.1 6.2  Hemoglobin 13.0 - 17.0 g/dL 15.0 16.0 14.7  Hematocrit 39.0 - 52.0 % 43.9 46.9 44.0  Platelets 150 - 400 K/uL 222 212 217    Lab Results  Component Value Date/Time   VD25OH 32.3 01/25/2020 12:00 AM   VD25OH 35 04/01/2016 12:07 PM    Clinical ASCVD: No  The ASCVD Risk score (Arnett DK, et al., 2019) failed to  calculate for the following reasons:   The valid total cholesterol range is 130 to 320 mg/dL    Depression screen [facility] 2/9 09/24/2020 06/21/2020 03/07/2020  Decreased Interest 0 0 0  Down, Depressed, Hopeless 0 0 0  PHQ - 2 Score 0 0 0  Altered sleeping 0 - -  Tired, decreased energy 0 - -  Change in appetite 0 - -  Feeling bad or failure about yourself  0 - -  Trouble concentrating 0 - -  Moving slowly or fidgety/restless 0 - -  Suicidal thoughts 0 - -  PHQ-9 Score 0 - -  Difficult doing work/chores Not difficult at all - -  Some recent data might be hidden    Social History   Tobacco Use  Smoking Status Never  Smokeless Tobacco Former   Types: Snuff   Quit date: 1980  Tobacco Comments   smoking cesation materials not required   BP Readings from Last 3 Encounters:  09/24/20 122/62  06/21/20 (!) 142/78  03/07/20 108/68   Pulse Readings from Last 3 Encounters:  09/24/20 70  06/21/20 64  03/07/20 71   Wt Readings from Last 3 Encounters:  09/24/20 154 lb 9.6 oz (70.1 kg)  06/21/20 163 lb 1.6 oz (74 kg)  03/07/20 161 lb 14.4 oz (73.4 kg)   BMI Readings from Last 3 Encounters:  09/24/20 25.73 kg/m  06/21/20 27.14 kg/m  03/07/20 26.94 kg/m    Assessment/Interventions: Review of patient past medical history, allergies, medications, health status, including review of consultants reports, laboratory and other test data, was performed as part of comprehensive evaluation and provision of chronic care management services.   SDOH:  (Social Determinants of Health) assessments and interventions performed: No   SDOH Screenings   Alcohol Screen: Low Risk    Last Alcohol Screening Score (AUDIT): 0  Depression (PHQ2-9): Low Risk    PHQ-2 Score: 0  Financial Resource Strain: Low Risk    Difficulty of Paying Living Expenses: Not hard at all  Food Insecurity: No Food Insecurity   Worried About Charity fundraiser in the Last Year: Never true   Ran Out of Food in the Last  Year: Never true  Housing: Low Risk    Last Housing Risk Score: 0  Physical Activity: Sufficiently Active   Days of Exercise per Week: 7 days   Minutes of Exercise per Session: 30 min  Social Connections: Moderately Integrated   Frequency of Communication with Friends and Family: More than three times a week   Frequency of Social Gatherings with Friends and Family: Twice a week   Attends Religious Services: More than 4 times per year   Active Member of [employer] or Organizations: No   Attends Archivist Meetings: Never   Marital Status: Married  Stress: No Stress Concern Present   Feeling of Stress : Not at all  Tobacco Use: Medium Risk   Smoking Tobacco Use: Never   Smokeless Tobacco Use: Former  Transport planner Needs: No Data processing manager (Medical): No   Lack of Transportation (Non-Medical): No    CCM Care Plan  No Known Allergies  Medications Reviewed Today     Reviewed by Myles Gip, DO (Physician) on 09/24/20 at [place]  Med List Status: <None>   Medication Order Taking? Sig Documenting Provider Last Dose Status Informant  Cholecalciferol 25 MCG (1000 UT) tablet [id number] Yes Take 1 tablet by mouth daily. [provider] Taking Active   empagliflozin (JARDIANCE) 10 MG TABS tablet [id number] Yes Take 1 tablet (10 mg total) by mouth daily. Kathrine Haddock, NP Taking Active   finasteride (PROSCAR) 5 MG tablet [id number] Yes Take 1 tablet (5 mg total) by mouth daily. Kathrine Haddock, NP Taking Active   gabapentin (NEURONTIN) 300 MG capsule [id number] Yes Take 1 capsule (300 mg total) by mouth 3 (three) times daily. Kathrine Haddock, NP Taking Active   glimepiride (AMARYL) 4 MG tablet [id number] Yes TAKE 1 TABLET BY MOUTH  DAILY WITH BREAKFAST, HOLD  IF BLOOD SUGAR &lt;100 OR IF  NOT EATING Kathrine Haddock, NP Taking Active   glucose blood (ACCU-CHEK GUIDE) test strip [id number] Yes Use as instructed Delsa Grana, PA-C Taking Active   Lancets  MISC [id number] Yes Use as directed to monitor blood sugar once daily. Dx: E11.65. Delsa Grana, PA-C Taking Active   losartan-hydrochlorothiazide (HYZAAR) 100-25 MG tablet [id number] Yes Take 1 tablet by mouth daily. For blood pressure Kathrine Haddock, NP Taking Active   metFORMIN (GLUCOPHAGE) 1000 MG tablet [id number] Yes TAKE 1 TABLET BY MOUTH  TWICE DAILY WITH A MEAL Kathrine Haddock, NP Taking Active   rosuvastatin (CRESTOR) 5 MG tablet [id number] Yes Take 1 tablet (5 mg total) by mouth at bedtime. Kathrine Haddock, NP Taking Active   sildenafil (VIAGRA) 50 MG tablet [id number] Yes Take 1-2 tablets (50-100 mg total) by mouth daily as needed for erectile dysfunction (take 30 min before sexual activity). Delsa Grana, PA-C Taking Active   tamsulosin (FLOMAX) 0.4 MG CAPS capsule [id number] Yes Take 1 capsule (0.4 mg total) by mouth daily. Kathrine Haddock, NP Taking Active             Patient Active Problem List   Diagnosis Date Noted   Other personal history presenting hazards to health 02/23/2020   Vertigo 08/16/2019   Penile irritation 03/24/2016   Lumbar spinal stenosis 04/22/2015   Uncontrolled type 2 diabetes mellitus 11/05/2014   Hypertension goal BP (blood pressure) < 140/90 11/05/2014   Hyperlipidemia 11/05/2014   Benign prostate hyperplasia 11/05/2014    Immunization History  Administered Date(s) Administered   Fluad Quad(high Dose 65+) 10/19/2018   Influenza,inj,Quad PF,6+ Mos 10/09/2015, 10/02/2020   Influenza-Unspecified 10/12/2008, 10/02/2014, 10/16/2016, 10/07/2017, 09/27/2019, 10/22/2019   Moderna Sars-Covid-2 Vaccination 02/24/2019, 03/27/2019, 11/14/2019, 04/15/2020   Pneumococcal Conjugate-13 02/19/2017   Pneumococcal Polysaccharide-23 11/30/2008, 06/17/2018   Tdap 11/22/2009   Zoster Recombinat (Shingrix) 05/24/2019, 07/26/2019    Conditions to be addressed/monitored:  Hypertension, Hyperlipidemia, Diabetes and BPH  There are no care plans that you recently modified  to display for this patient.   Compliance/Adherence/Medication fill history: Care Gaps: None ID  Star-Rating Drugs: Jardiance 10 mg last filled on 06/24/2020 for a 90-Day supply with [facility] Rosuvastatin 5 mg last filled on 09/17/2020 for a 90-Day supply  with [facility] Losartan-HCTZ 100-25 mg last filled on 09/17/2020 for a 90-Day supply with [facility] Glimepiride 4 mg last filled on 09/17/2020 for a 90-Day supply with [facility] Metformin 1000 mg last filled on 09/17/2020 for a 90-Day supply with [facility]  Medication Assistance: Application for Jardiance  medication assistance program. in process.  Anticipated assistance start date TBD.  See plan of care for additional detail.  Patient's preferred pharmacy is:  [facility] [address], [place] - [place] [address] [place] [address] Phone: [phone] Fax: [phone]  [facility] ([place], [place] [address] Phone: [phone] Fax: [phone]  Uses pill box? Yes Pt endorses 100% compliance  We discussed: Current pharmacy is preferred with insurance plan and patient is satisfied with pharmacy services Patient decided to: Continue current medication management strategy  Care Plan and Follow Up Patient Decision:  Patient agrees to Care Plan and Follow-up.  Plan: Telephone follow up appointment with care management team member scheduled for:  02/12/2021 at 11:00 AM  Doristine Section, [facility] [phone]

## 2020-10-16 NOTE — Patient Instructions (Signed)
Visit Information It was great speaking with you today!  Please let me know if you have any questions about our visit.   Goals Addressed             This Visit's Progress    Monitor and Manage My Blood Sugar-Diabetes Type 2   Not on track    Timeframe:  Long-Range Goal Priority:  High Start Date:   04/04/2020                           Expected End Date:   10/05/2021                    Follow Up within 30 days   - check blood sugar at prescribed times - enter blood sugar readings and medication or insulin into daily log - take the blood sugar log to all doctor visits    Why is this important?   Checking your blood sugar at home helps to keep it from getting very high or very low.  Writing the results in a diary or log helps the doctor know how to care for you.  Your blood sugar log should have the time, date and the results.  Also, write down the amount of insulin or other medicine that you take.  Other information, like what you ate, exercise done and how you were feeling, will also be helpful.     Notes:         Patient Care Plan: General Pharmacy (Adult)     Problem Identified: Hypertension, Hyperlipidemia, Diabetes and BPH   Priority: High     Long-Range Goal: Patient-Specific Goal   Start Date: 04/04/2020  Expected End Date: 10/16/2021  This Visit's Progress: Not on track  Recent Progress: On track  Priority: High  Note:   Current Barriers:  Unable to achieve control of diabetes   Pharmacist Clinical Goal(s):  Patient will achieve control of diabetes as evidenced by A1c less than 8% through collaboration with PharmD and provider.   Interventions: 1:1 collaboration with Delsa Grana, PA-C regarding development and update of comprehensive plan of care as evidenced by provider attestation and co-signature Inter-disciplinary care team collaboration (see longitudinal plan of care) Comprehensive medication review performed; medication list updated in electronic  medical record  Hypertension (BP goal <140/90) -Controlled -Current treatment: Losartan-HCTZ 100-25 mg daily  -Medications previously tried: NA  -Current home readings: 118/74 -Denies hypotensive/hypertensive symptoms -Educated on Daily salt intake goal < 2300 mg; Exercise goal of 150 minutes per week; -Counseled to monitor BP at home weekly, document, and provide log at future appointments -Recommended to continue current medication  Hyperlipidemia: (LDL goal < 70) -Controlled -Current treatment: Rosuvastatin 5 mg nightly  -Medications previously tried: NA  -Educated on Importance of limiting foods high in cholesterol; -Recommended to continue current medication  Diabetes (A1c goal <8%) -Controlled -Current medications: Glimepiride 4 mg daily  Metformin 1000 mg twice daily  -Medications previously tried: Januvia   -Ran out of Jardiance due to being in the [employer], reports he has not taken in one month  -Current home glucose readings:  Fasting Afternoon  29-Sep 161 127  27-Sep 185 110  24-Sep 160 124  21-Sep 170 115  Average 169 119  -Denies hypoglycemic/hyperglycemic symptoms -Current meal patterns:  breakfast: Cereal + fruit + milk   lunch: Varies   dinner: Fried Chicken + Mashed Potatoes + glass of sweet tea.  snacks: Grapes (5-6) before  bed.  drinks: water, will sometimes do vinegar sips  -Current exercise: Walking daily (20 minutes)  -Will see about getting samples of Jardiance and starting PAP for patient.  -Recommended to continue current medication  BPH (Goal: achieve symptom relief) -Controlled -Current treatment  Finasteride 5 mg daily  Tamsulosin 0.4 mg daily  -Medications previously tried: NA -Recommended to continue current medication  Patient Goals/Self-Care Activities Patient will:  - check glucose twice daily (before breakfast and before supper), document, and provide at future appointments check blood pressure weekly, document, and provide  at future appointments  Follow Up Plan: Telephone follow up appointment with care management team member scheduled for:  02/12/2021 at 11:00 AM    Patient agreed to services and verbal consent obtained.   The patient verbalized understanding of instructions, educational materials, and care plan provided today and declined offer to receive copy of patient instructions, educational materials, and care plan.   [name], [facility] [phone]

## 2020-10-22 ENCOUNTER — Telehealth: Payer: Self-pay

## 2020-10-22 NOTE — Progress Notes (Signed)
Chronic Care Management Pharmacy Assistant   Name: Aaron Calhoun.  MRN: [id number] DOB: 10-02-1945  Patient Assistance Application  I received a task from [name], CPP requesting a I complete a patient assistance application  for the patient's Jardiance. I did ask CPP if patient had already tried to get low income subsidy but the CPP stated he did not ask. For [employer] they do request that patients who receive SSI complete the LIS application prior to filling out an application with them. If the patient is denied LIS than we can complete the application for Jardiance and submit along with the denial letter for LIS.  Reached out to the patient, but had to leave a message requesting him to give me a call back  directly @ [phone] regarding the LIS application process.  Patient returned my call while I was on lunch, and LVM requesting I return his call. I contacted the patient back on his mobile phone. I informed the patient that in order to receive patient assistance for Jardiance we would need to fill out the LIS application via social security first. Patient is agreeable, but upon filling this information out the application was unable to be submitted via online due to an error with information regarding one of the social security numbers the system did not recognize. Patient requested I call social security with him on the phone to assist with letting them know what we are doing and to allow them to assist him further.   The representative that answered the call after 50 minutes was able to take the patient's information and assist him further, and setting up an appointment on line for someone to give him a call  once the application has been submitted.  The patient's appointment with Social Security will be 10/14 @ 1330 and the representative informed him he would get a letter in the mail and text message reminding him about the appointment.  I advised patient that once he has his  appointment, and if they approve or deny him for him to contact me and let me know so I will know how to proceed. Patient verbalized understanding.   Medications: Outpatient Encounter Medications as of 10/22/2020  Medication Sig   Cholecalciferol 25 MCG (1000 UT) tablet Take 1 tablet by mouth daily.   empagliflozin (JARDIANCE) 10 MG TABS tablet Take 1 tablet (10 mg total) by mouth daily. (Patient not taking: Reported on 10/16/2020)   finasteride (PROSCAR) 5 MG tablet Take 1 tablet (5 mg total) by mouth daily.   gabapentin (NEURONTIN) 300 MG capsule Take 1 capsule (300 mg total) by mouth 3 (three) times daily.   glimepiride (AMARYL) 4 MG tablet TAKE 1 TABLET BY MOUTH  DAILY WITH BREAKFAST, HOLD  IF BLOOD SUGAR &lt;100 OR IF  NOT EATING   glucose blood (ACCU-CHEK GUIDE) test strip Use as instructed   Lancets MISC Use as directed to monitor blood sugar once daily. Dx: E11.65.   losartan-hydrochlorothiazide (HYZAAR) 100-25 MG tablet Take 1 tablet by mouth daily. For blood pressure   metFORMIN (GLUCOPHAGE) 1000 MG tablet TAKE 1 TABLET BY MOUTH  TWICE DAILY WITH A MEAL   rosuvastatin (CRESTOR) 5 MG tablet Take 1 tablet (5 mg total) by mouth at bedtime.   sildenafil (VIAGRA) 50 MG tablet Take 1-2 tablets (50-100 mg total) by mouth daily as needed for erectile dysfunction (take 30 min before sexual activity).   tamsulosin (FLOMAX) 0.4 MG CAPS capsule Take 1 capsule (0.4 mg total) by  mouth daily.   No facility-administered encounter medications on file as of 10/22/2020.     Lynann Bologna, CPA/CMA Clinical Pharmacist Assistant Phone: [phone]

## 2020-10-29 ENCOUNTER — Telehealth: Payer: Self-pay

## 2020-10-29 NOTE — Progress Notes (Signed)
    Chronic Care Management Pharmacy Assistant   Name: Salah Nakamura.  MRN: [id number] DOB: 1945/08/21  Patient Assistant Application for Jardiance  Patient called me to inform me that he received a letter stating that he does not qualify for [place]. I informed the patient that he would need to keep that letter so we can include it with his patient assistance application that we will submit to [employer] for his Jardiance.  Patient advised that I would start the application today, and e-mail it to [name], CPP so he can print it out. Patient stated that he would pick the application up at the providers office. I informed him that he when he comes into the office he can complete the application there, let the front desk staff make a copy of his denial letter, and give both the denial letter and application to the staff so they can give it to Cristie Hem to fax over to [employer] @ [phone] for processing.   Patient verbalized understanding to all. I encouraged the patient that if he has any questions about anything he can give me a call for clarification.   Application e-mailed to [name], CPP he will print and leave the application at the front desk for the patient.   Medications: Outpatient Encounter Medications as of 10/29/2020  Medication Sig   Cholecalciferol 25 MCG (1000 UT) tablet Take 1 tablet by mouth daily.   empagliflozin (JARDIANCE) 10 MG TABS tablet Take 1 tablet (10 mg total) by mouth daily. (Patient not taking: Reported on 10/16/2020)   finasteride (PROSCAR) 5 MG tablet Take 1 tablet (5 mg total) by mouth daily.   gabapentin (NEURONTIN) 300 MG capsule Take 1 capsule (300 mg total) by mouth 3 (three) times daily.   glimepiride (AMARYL) 4 MG tablet TAKE 1 TABLET BY MOUTH  DAILY WITH BREAKFAST, HOLD  IF BLOOD SUGAR &lt;100 OR IF  NOT EATING   glucose blood (ACCU-CHEK GUIDE) test strip Use as instructed   Lancets MISC Use as directed to monitor blood sugar once  daily. Dx: E11.65.   losartan-hydrochlorothiazide (HYZAAR) 100-25 MG tablet Take 1 tablet by mouth daily. For blood pressure   metFORMIN (GLUCOPHAGE) 1000 MG tablet TAKE 1 TABLET BY MOUTH  TWICE DAILY WITH A MEAL   rosuvastatin (CRESTOR) 5 MG tablet Take 1 tablet (5 mg total) by mouth at bedtime.   sildenafil (VIAGRA) 50 MG tablet Take 1-2 tablets (50-100 mg total) by mouth daily as needed for erectile dysfunction (take 30 min before sexual activity).   tamsulosin (FLOMAX) 0.4 MG CAPS capsule Take 1 capsule (0.4 mg total) by mouth daily.   No facility-administered encounter medications on file as of 10/29/2020.   Lynann Bologna, CPA/CMA Clinical Pharmacist Assistant Phone: [phone]

## 2020-11-11 DIAGNOSIS — E1140 Type 2 diabetes mellitus with diabetic neuropathy, unspecified: Secondary | ICD-10-CM

## 2020-11-11 DIAGNOSIS — E7800 Pure hypercholesterolemia, unspecified: Secondary | ICD-10-CM | POA: Diagnosis not present

## 2020-11-18 ENCOUNTER — Telehealth: Payer: Self-pay

## 2020-11-18 NOTE — Progress Notes (Signed)
Chronic Care Management Pharmacy Assistant   Name: Aaron Calhoun.  MRN: [id number] DOB: [birth year]  Patient Assistance Application Status for Jardiance  I received a task requesting that I reach out to [facility] @ [phone] to verify the receipt of the application that the CPP faxed over on 11/06/2020 for the patient's Jardiance.  I contacted [employer], and spoke with a representative whom stated that the the patient would need to apply for LIS before they would be able to process his application. I explained to the representative that the patient did apply, and that his denial letter should have been faxed over with the patient's application. The representative stated that the letter she received was a denial for SSI not LIS.  I called to patient so I could update him on this information, but I had to leave a message requesting the patient return my call.  Patient returned my call, and I was able to complete the LIS Application for the patient. Patient has been advised that the application has been submitted via online to the [employer]. Patient advised that if Social Security needs additional information they will contact him directly via telephone. If there is nothing else needed from him they will process his application, and mail him out a letter with their final decision. Patient has been instructed to contact me directly @ [phone] once he receives his decision. Patient advised if they approved him for LIS there will be nothing more to do regarding his Jardiance. The prescription will continue to be sent to his chosen pharmacy, but his copayment will either be zero dollars or an affordable amount. Patient instructed if they deny his application than we will need to submit the denial letter to [facility] so they can finish processing his patient assistance application. Patient instructed to give it about 2 weeks, and if he has not heard anything he can contact [place]  directly @ [phone] Monday-Friday 7 am - 7 pm, or he can give me a call and we can call them together. The patient verbalized understanding to all.  Medications: Outpatient Encounter Medications as of 11/18/2020  Medication Sig   Cholecalciferol 25 MCG (1000 UT) tablet Take 1 tablet by mouth daily.   empagliflozin (JARDIANCE) 10 MG TABS tablet Take 1 tablet (10 mg total) by mouth daily. (Patient not taking: Reported on 10/16/2020)   finasteride (PROSCAR) 5 MG tablet Take 1 tablet (5 mg total) by mouth daily.   gabapentin (NEURONTIN) 300 MG capsule Take 1 capsule (300 mg total) by mouth 3 (three) times daily.   glimepiride (AMARYL) 4 MG tablet TAKE 1 TABLET BY MOUTH  DAILY WITH BREAKFAST, HOLD  IF BLOOD SUGAR &lt;100 OR IF  NOT EATING   glucose blood (ACCU-CHEK GUIDE) test strip Use as instructed   Lancets MISC Use as directed to monitor blood sugar once daily. Dx: E11.65.   losartan-hydrochlorothiazide (HYZAAR) 100-25 MG tablet Take 1 tablet by mouth daily. For blood pressure   metFORMIN (GLUCOPHAGE) 1000 MG tablet TAKE 1 TABLET BY MOUTH  TWICE DAILY WITH A MEAL   rosuvastatin (CRESTOR) 5 MG tablet Take 1 tablet (5 mg total) by mouth at bedtime.   sildenafil (VIAGRA) 50 MG tablet Take 1-2 tablets (50-100 mg total) by mouth daily as needed for erectile dysfunction (take 30 min before sexual activity).   tamsulosin (FLOMAX) 0.4 MG CAPS capsule Take 1 capsule (0.4 mg total) by mouth daily.   No facility-administered encounter medications on file as  of 11/18/2020.    Lynann Bologna, CPA/CMA Clinical Pharmacist Assistant Phone: [phone]

## 2020-11-21 ENCOUNTER — Telehealth: Payer: Self-pay | Admitting: *Deleted

## 2020-11-21 NOTE — Chronic Care Management (AMB) (Signed)
  Care Management   Note  11/21/2020 Name: Aaron Calhoun. MRN: [id number] DOB: 01-05-1946  Aaron Calhoun. is a 75 y.o. year old male who is a primary care patient of Laurell Roof and is actively engaged with the care management team. I reached out to [name] by phone today to assist with scheduling an initial visit with the RN Case Manager  Follow up plan: Unsuccessful telephone outreach attempt made. A HIPAA compliant phone message was left for the patient providing contact information and requesting a return call.  The care management team will reach out to the patient again over the next 7 days.  If patient returns call to provider office, please advise to call [place] at [phone].  [place] [facility]  Direct Dial: [phone]

## 2020-11-21 NOTE — Chronic Care Management (AMB) (Signed)
  Care Management   Note  11/21/2020 Name: Mansoor Hillyard. MRN: [id number] DOB: Oct 01, 1945  Cordale Manera. is a 75 y.o. year old male who is a primary care patient of Laurell Roof and is actively engaged with the care management team. I reached out to [name] by phone today to assist with scheduling an initial visit with the RN Case Manager  Follow up plan: Telephone appointment with care management team member scheduled for:11/27/20  [place] Management  Direct Dial: [phone]

## 2020-11-27 ENCOUNTER — Ambulatory Visit (INDEPENDENT_AMBULATORY_CARE_PROVIDER_SITE_OTHER): Payer: HMO

## 2020-11-27 DIAGNOSIS — I152 Hypertension secondary to endocrine disorders: Secondary | ICD-10-CM

## 2020-11-27 DIAGNOSIS — E1159 Type 2 diabetes mellitus with other circulatory complications: Secondary | ICD-10-CM

## 2020-11-27 DIAGNOSIS — E1169 Type 2 diabetes mellitus with other specified complication: Secondary | ICD-10-CM

## 2020-11-27 DIAGNOSIS — E1140 Type 2 diabetes mellitus with diabetic neuropathy, unspecified: Secondary | ICD-10-CM

## 2020-11-27 NOTE — Patient Instructions (Addendum)
Thank you for allowing the Chronic Care Management team to participate in your care.   Problem: Hypertension, Hyperlipidemia, Type 2 Diabetes      Long-Range Goal: Disease Progression Prevented or Minimized   Start Date: 11/27/2020  Expected End Date: 02/25/2021  Priority: High  Note:   Current Barriers:  Chronic Disease Management support and education needs related to HTN, HLD, and DMII  RNCM Clinical Goal(s):  Patient will demonstrate Improved adherence to prescribed treatment plan for HTN, HLD, and DMII through collaboration with the provider and care management team.  Interventions: 1:1 collaboration with primary care provider regarding development and update of comprehensive plan of care as evidenced by provider attestation and co-signature Inter-disciplinary care team collaboration (see longitudinal plan of care) Evaluation of current treatment plan related to  self management and patient's adherence to plan as established by provider  Hypertension Interventions: Last practice recorded BP readings:  BP Readings from Last 3 Encounters:  09/24/20 122/62  06/21/20 (!) 142/78  03/07/20 108/68  Most recent eGFR/CrCl: No results found for: EGFR  No components found for: CRCL  Reviewed medications and importance of compliance. Reports excellent compliance. Denies concerns regarding medication management. Will update the CCM Pharmacist if he requires assistance with medication cost. Provided information regarding established blood pressure parameters along with indications for notifying a provider. Reports home readings have been within range. Encouraged to continue monitoring and record readings.  Discussed compliance with recommended cardiac prudent diet. Encouraged to read nutrition labels, monitor sodium intake and avoid highly processed foods when possible. Discussed complications of uncontrolled blood pressure.  Reviewed s/sx of heart attack, stroke and worsening symptoms that  require immediate medical attention.     Hyperlipidemia Interventions: Lab Results  Component Value Date   CHOL 103 02/23/2020   HDL 53 02/23/2020   LDLCALC 32 02/23/2020   TRIG 96 02/23/2020   CHOLHDL 1.9 02/23/2020  Reviewed medications. Currently tolerating statin well. Advised to notify a provider if unable to tolerate the regimen. Reviewed established cholesterol goals Discussed importance of completing regular labs as scheduled Discussed strategies to manage statin-induced myalgias Reviewed importance of limiting foods high in cholesterol  Diabetes Interventions: Lab Results  Component Value Date   HGBA1C 7.7 (A) 09/24/2020  Reviewed medications. Reports taking as prescribed. Reports currently working the [employer] to receive assistance with Jardiance.  Provided information regarding importance of consistent blood glucose monitoring. Currently monitors levels a few times a week. Readings have ranged from 102 to 166 mg/dl. Encouraged to monitor and maintain a log. Reviewed s/sx of hypoglycemia and hyperglycemia along with recommended interventions. Discussed nutritional intake and importance of complying with a carb modified/diabetic diet.  Discussed and offered referrals for available Diabetes education classes. Declined need for additional resources. Discussed importance of completing recommended DM preventive care. Reports completing foot care and eye exams as advised. Foot exam and Eye exam are up to date.   Patient Goals/Self-Care Activities: Patient will self administer medications as prescribed Patient will attend all scheduled provider appointments Patient will call pharmacy for medication refills Patient will continue to perform ADL's independently Patient will continue to perform IADL's independently Patient will call provider office for new concerns or questions   Follow Up Plan:   Will follow up in two months     Consent to CCM Services: Mr. Dube  was given information about Chronic Care Management services including:  CCM service includes personalized support from designated clinical staff supervised by his physician, including individualized plan of care and  coordination with other care providers 24/7 contact phone numbers for assistance for urgent and routine care needs. Service will only be billed when office clinical staff spend 20 minutes or more in a month to coordinate care. Only one practitioner may furnish and bill the service in a calendar month. The patient may stop CCM services at any time (effective at the end of the month) by phone call to the office staff. The patient will be responsible for cost sharing (co-pay) of up to 20% of the service fee (after annual deductible is met).  Patient agreed to services and verbal consent obtained.   Mr. Dershem verbalized understanding of the information discussed during the telephonic outreach. Declined need for mailed/printed instructions.  A member of the care management team will follow up in two months.    Cristy [facility]lander [facility] Care Management [facility] [phone]

## 2020-11-27 NOTE — Chronic Care Management (AMB) (Signed)
Chronic Care Management   CCM RN Visit Note  11/27/2020 Name: Aaron Calhoun. MRN: [id number] DOB: 22-Nov-1945  Subjective: Aaron Calhoun. is a 75 y.o. year old male who is a primary care patient of Delsa Grana, [place]. The care management team was consulted for assistance with disease management and care coordination needs.    Engaged with patient by telephone for initial visit in response to provider referral for case management and care coordination services.   Consent to Services:  The patient was given the following information about Chronic Care Management services: 1. CCM service includes personalized support from designated clinical staff supervised by the primary care provider, including individualized plan of care and coordination with other care providers 2. 24/7 contact phone numbers for assistance for urgent and routine care needs. 3. Service will only be billed when office clinical staff spend 20 minutes or more in a month to coordinate care. 4. Only one practitioner may furnish and bill the service in a calendar month. 5.The patient may stop CCM services at any time (effective at the end of the month) by phone call to the office staff. 6. The patient will be responsible for cost sharing (co-pay) of up to 20% of the service fee (after annual deductible is met). Patient agreed to services and consent obtained.  Assessment: Review of patient past medical history, allergies, medications, health status, including review of consultants reports, laboratory and other test data, was performed as part of comprehensive evaluation and provision of chronic care management services.   SDOH (Social Determinants of Health) assessments and interventions performed:  SDOH Interventions    Flowsheet Row Most Recent Value  SDOH Interventions   Food Insecurity Interventions Intervention Not Indicated  Transportation Interventions Intervention Not Indicated        CCM Care Plan  No Known  Allergies  Outpatient Encounter Medications as of 11/27/2020  Medication Sig   Cholecalciferol 25 MCG (1000 UT) tablet Take 1 tablet by mouth daily.   empagliflozin (JARDIANCE) 10 MG TABS tablet Take 1 tablet (10 mg total) by mouth daily.   finasteride (PROSCAR) 5 MG tablet Take 1 tablet (5 mg total) by mouth daily.   gabapentin (NEURONTIN) 300 MG capsule Take 1 capsule (300 mg total) by mouth 3 (three) times daily.   glimepiride (AMARYL) 4 MG tablet TAKE 1 TABLET BY MOUTH  DAILY WITH BREAKFAST, HOLD  IF BLOOD SUGAR &lt;100 OR IF  NOT EATING   losartan-hydrochlorothiazide (HYZAAR) 100-25 MG tablet Take 1 tablet by mouth daily. For blood pressure   metFORMIN (GLUCOPHAGE) 1000 MG tablet TAKE 1 TABLET BY MOUTH  TWICE DAILY WITH A MEAL   rosuvastatin (CRESTOR) 5 MG tablet Take 1 tablet (5 mg total) by mouth at bedtime.   tamsulosin (FLOMAX) 0.4 MG CAPS capsule Take 1 capsule (0.4 mg total) by mouth daily.   glucose blood (ACCU-CHEK GUIDE) test strip Use as instructed   Lancets MISC Use as directed to monitor blood sugar once daily. Dx: E11.65.   sildenafil (VIAGRA) 50 MG tablet Take 1-2 tablets (50-100 mg total) by mouth daily as needed for erectile dysfunction (take 30 min before sexual activity).   No facility-administered encounter medications on file as of 11/27/2020.    Patient Active Problem List   Diagnosis Date Noted   Other personal history presenting hazards to health 02/23/2020   Vertigo 08/16/2019   Penile irritation 03/24/2016   Lumbar spinal stenosis 04/22/2015   Uncontrolled type 2 diabetes mellitus 11/05/2014   Hypertension goal BP (  blood pressure) < 140/90 11/05/2014   Hyperlipidemia 11/05/2014   Benign prostate hyperplasia 11/05/2014    Conditions to be addressed/monitored:HTN, HLD, and DMII  Patient Care Plan: RN Care Management Plan of Care     Problem Identified: Hypertension, Hyperlipidemia, Type 2 Diabetes      Long-Range Goal: Disease Progression Prevented  or Minimized   Start Date: 11/27/2020  Expected End Date: 02/25/2021  Priority: High  Note:   Current Barriers:  Chronic Disease Management support and education needs related to HTN, HLD, and DMII  RNCM Clinical Goal(s):  Patient will demonstrate Improved adherence to prescribed treatment plan for HTN, HLD, and DMII through collaboration with the provider and care management team.  Interventions: 1:1 collaboration with primary care provider regarding development and update of comprehensive plan of care as evidenced by provider attestation and co-signature Inter-disciplinary care team collaboration (see longitudinal plan of care) Evaluation of current treatment plan related to  self management and patient's adherence to plan as established by provider  Hypertension Interventions: Last practice recorded BP readings:  BP Readings from Last 3 Encounters:  09/24/20 122/62  06/21/20 (!) 142/78  03/07/20 108/68  Most recent eGFR/CrCl: No results found for: EGFR  No components found for: CRCL  Reviewed medications and importance of compliance. Reports excellent compliance. Denies concerns regarding medication management. Will update the CCM Pharmacist if he requires assistance with medication cost. Provided information regarding established blood pressure parameters along with indications for notifying a provider. Reports home readings have been within range. Encouraged to continue monitoring and record readings.  Discussed compliance with recommended cardiac prudent diet. Encouraged to read nutrition labels, monitor sodium intake and avoid highly processed foods when possible. Discussed complications of uncontrolled blood pressure.  Reviewed s/sx of heart attack, stroke and worsening symptoms that require immediate medical attention.     Hyperlipidemia Interventions: Lab Results  Component Value Date   CHOL 103 02/23/2020   HDL 53 02/23/2020   LDLCALC 32 02/23/2020   TRIG 96  02/23/2020   CHOLHDL 1.9 02/23/2020  Reviewed medications. Currently tolerating statin well. Advised to notify a provider if unable to tolerate the regimen. Reviewed established cholesterol goals Discussed importance of completing regular labs as scheduled Discussed strategies to manage statin-induced myalgias Reviewed importance of limiting foods high in cholesterol  Diabetes Interventions: Lab Results  Component Value Date   HGBA1C 7.7 (A) 09/24/2020  Reviewed medications. Reports taking as prescribed. Reports currently working the [employer] to receive assistance with Jardiance.  Provided information regarding importance of consistent blood glucose monitoring. Currently monitors levels a few times a week. Readings have ranged from 102 to 166 mg/dl. Encouraged to monitor and maintain a log. Reviewed s/sx of hypoglycemia and hyperglycemia along with recommended interventions. Discussed nutritional intake and importance of complying with a carb modified/diabetic diet.  Discussed and offered referrals for available Diabetes education classes. Declined need for additional resources. Discussed importance of completing recommended DM preventive care. Reports completing foot care and eye exams as advised. Foot exam and Eye exam are up to date.   Patient Goals/Self-Care Activities: Patient will self administer medications as prescribed Patient will attend all scheduled provider appointments Patient will call pharmacy for medication refills Patient will continue to perform ADL's independently Patient will continue to perform IADL's independently Patient will call provider office for new concerns or questions   Follow Up Plan:   Will follow up in two months       PLAN A member of the care management team will  follow up two months.   Cristy [facility]lander [facility] Care Management [facility] [phone]

## 2020-11-29 ENCOUNTER — Telehealth: Payer: Self-pay

## 2020-11-29 NOTE — Progress Notes (Signed)
Chronic Care Management Pharmacy Assistant   Name: Aaron Calhoun.  MRN: [id number] DOB: 06-22-1945  Reason for Encounter: Diabetes Disease State Call   Recent office visits:  None ID  Recent consult visits:  None ID  Hospital visits:  None in previous 6 months  Medications: Outpatient Encounter Medications as of 11/29/2020  Medication Sig   Cholecalciferol 25 MCG (1000 UT) tablet Take 1 tablet by mouth daily.   empagliflozin (JARDIANCE) 10 MG TABS tablet Take 1 tablet (10 mg total) by mouth daily.   finasteride (PROSCAR) 5 MG tablet Take 1 tablet (5 mg total) by mouth daily.   gabapentin (NEURONTIN) 300 MG capsule Take 1 capsule (300 mg total) by mouth 3 (three) times daily.   glimepiride (AMARYL) 4 MG tablet TAKE 1 TABLET BY MOUTH  DAILY WITH BREAKFAST, HOLD  IF BLOOD SUGAR &lt;100 OR IF  NOT EATING   glucose blood (ACCU-CHEK GUIDE) test strip Use as instructed   Lancets MISC Use as directed to monitor blood sugar once daily. Dx: E11.65.   losartan-hydrochlorothiazide (HYZAAR) 100-25 MG tablet Take 1 tablet by mouth daily. For blood pressure   metFORMIN (GLUCOPHAGE) 1000 MG tablet TAKE 1 TABLET BY MOUTH  TWICE DAILY WITH A MEAL   rosuvastatin (CRESTOR) 5 MG tablet Take 1 tablet (5 mg total) by mouth at bedtime.   sildenafil (VIAGRA) 50 MG tablet Take 1-2 tablets (50-100 mg total) by mouth daily as needed for erectile dysfunction (take 30 min before sexual activity).   tamsulosin (FLOMAX) 0.4 MG CAPS capsule Take 1 capsule (0.4 mg total) by mouth daily.   No facility-administered encounter medications on file as of 11/29/2020.   Care Gaps: None ID  Star Rating Drugs: Jardiance 10 mg Patient assistance is being worked on for this medication Rosuvastatin 5 mg last filled on 09/17/2020 for a 90-Day supply with [place] Losartan-HCTZ 100-25 mg last filled on 09/17/2020 for a 90-Day supply with [place] Glimepiride 4 mg last filled on  09/17/2020 for a 90-Day supply with [place] Metformin 1000 mg last filled on 09/17/2020 for a 90-Day supply with [place]  Recent Relevant Labs: Lab Results  Component Value Date/Time   HGBA1C 7.7 (A) 09/24/2020 09:00 AM   HGBA1C 7.9 (A) 06/21/2020 07:49 AM   HGBA1C 8.4 (H) 02/23/2020 08:08 AM   HGBA1C 8.7 01/25/2020 12:00 AM   HGBA1C 7.6 (H) 08/23/2019 09:07 AM   MICROALBUR <0.2 10/21/2018 12:00 AM   MICROALBUR 0.9 08/11/2017 09:33 AM   MICROALBUR NEG 07/19/2015 10:20 AM   MICROALBUR 20 07/03/2014 03:32 PM    Kidney Function Lab Results  Component Value Date/Time   CREATININE 0.80 09/24/2020 09:09 AM   CREATININE 0.85 02/23/2020 08:08 AM   GFRNONAA 85 02/23/2020 08:08 AM   GFRAA 99 02/23/2020 08:08 AM    Current antihyperglycemic regimen:  Jardiance 1 tablet daily- currently not taking as he is out and waiting on patient assistance to be approved Glimepiride 4 mg 1 tablet twice daily  Metformin 1000 mg 1 tablet twice dialy  What recent interventions/DTPs have been made to improve glycemic control:  Patient is running out of samples of Jardiance CPP is going to give samples of Farxiga until we can get patient assistance approved for Jardiance  Have there been any recent hospitalizations or ED visits since last visit with CPP? No  Patient denies hypoglycemic symptoms, including Pale, Sweaty, Shaky, Hungry, Nervous/irritable, and Vision changes  Patient denies  hyperglycemic symptoms, including blurry vision, excessive thirst, fatigue, polyuria, and weakness  How often are you checking your blood sugar? twice daily  What are your blood sugars ranging?  Fasting: 138-168 Bedtime: 114-120  During the week, how often does your blood glucose drop below 70? Never  Are you checking your feet daily/regularly? Yes currently no issues  Adherence Review: Is the patient currently on a STATIN medication? Yes Is the patient currently on ACE/ARB  medication? Yes Does the patient have >5 day gap between last estimated fill dates? No  Patient has follow-up telephone appointment with CPP on 02/13/2020 @ 1100'  Patient reports he is doing well, but he is currently out of the [place]. I informed him that per CPP he has samples of Wilder Glade that he will provide him which is a medication close to [place] that he can take for now until we can get the patient assistance approved for Jardiance. We applied for [place] on 11/18/2020, and the patient stated he still has not heard anything from social security at this time. I informed him that as soon as he hears something to let me know. If patient is approved there is nothing left to do the approval of LIS will lower the cost of Jardiance automatically for the patient. If patient is denied he is advised to bring the letter to the clinic so the CPP can fax it to [facility] so the can finish processing the patient assistance application that we have already faxed over to them. Patient stated he understands, and he will pick up samples on Monday. CPP has been advised to have them ready for the patient.   Lynann Bologna, CPA/CMA Clinical Pharmacist Assistant Phone: [phone]

## 2020-12-11 DIAGNOSIS — E785 Hyperlipidemia, unspecified: Secondary | ICD-10-CM

## 2020-12-11 DIAGNOSIS — E1169 Type 2 diabetes mellitus with other specified complication: Secondary | ICD-10-CM

## 2020-12-11 DIAGNOSIS — E1159 Type 2 diabetes mellitus with other circulatory complications: Secondary | ICD-10-CM

## 2020-12-11 DIAGNOSIS — E1140 Type 2 diabetes mellitus with diabetic neuropathy, unspecified: Secondary | ICD-10-CM

## 2020-12-11 DIAGNOSIS — Z87891 Personal history of nicotine dependence: Secondary | ICD-10-CM

## 2020-12-11 DIAGNOSIS — I152 Hypertension secondary to endocrine disorders: Secondary | ICD-10-CM

## 2020-12-24 ENCOUNTER — Ambulatory Visit: Payer: HMO | Admitting: Nurse Practitioner

## 2020-12-24 DIAGNOSIS — E1140 Type 2 diabetes mellitus with diabetic neuropathy, unspecified: Secondary | ICD-10-CM

## 2020-12-24 DIAGNOSIS — E7800 Pure hypercholesterolemia, unspecified: Secondary | ICD-10-CM

## 2020-12-24 DIAGNOSIS — I10 Essential (primary) hypertension: Secondary | ICD-10-CM

## 2020-12-24 DIAGNOSIS — E1159 Type 2 diabetes mellitus with other circulatory complications: Secondary | ICD-10-CM

## 2020-12-31 ENCOUNTER — Ambulatory Visit (INDEPENDENT_AMBULATORY_CARE_PROVIDER_SITE_OTHER): Payer: HMO | Admitting: Nurse Practitioner

## 2020-12-31 ENCOUNTER — Encounter: Payer: Self-pay | Admitting: Nurse Practitioner

## 2020-12-31 ENCOUNTER — Other Ambulatory Visit: Payer: Self-pay

## 2020-12-31 DIAGNOSIS — I10 Essential (primary) hypertension: Secondary | ICD-10-CM

## 2020-12-31 DIAGNOSIS — E1140 Type 2 diabetes mellitus with diabetic neuropathy, unspecified: Secondary | ICD-10-CM | POA: Diagnosis not present

## 2020-12-31 DIAGNOSIS — E7800 Pure hypercholesterolemia, unspecified: Secondary | ICD-10-CM | POA: Diagnosis not present

## 2020-12-31 MED ORDER — LOSARTAN POTASSIUM-HCTZ 100-25 MG PO TABS: 1.0000 | ORAL_TABLET | Freq: Every day | ORAL | 1 refills | Status: DC

## 2020-12-31 MED ORDER — GLIMEPIRIDE 4 MG PO TABS: ORAL_TABLET | ORAL | 1 refills | Status: DC

## 2020-12-31 MED ORDER — ROSUVASTATIN CALCIUM 5 MG PO TABS
5.0000 mg | ORAL_TABLET | Freq: Every day | ORAL | 1 refills | Status: DC
Start: 2020-12-31 — End: 2021-03-31

## 2020-12-31 MED ORDER — METFORMIN HCL 1000 MG PO TABS
ORAL_TABLET | ORAL | 1 refills | Status: DC
Start: 2020-12-31 — End: 2021-03-31

## 2020-12-31 MED ORDER — EMPAGLIFLOZIN 10 MG PO TABS: 10.0000 mg | ORAL_TABLET | Freq: Every day | ORAL | 1 refills | Status: DC

## 2020-12-31 NOTE — Progress Notes (Signed)
BP 130/70    Pulse 94    Temp 98.1 F (36.7 C) (Oral)    Resp 16    Ht 5\' 5"  (1.651 m)    Wt 159 lb 14.4 oz (72.5 kg)    SpO2 96%    BMI 26.61 kg/m    Subjective:    Patient ID: Aaron Simmonds., male    DOB: 1945-10-08, 75 y.o.   MRN: [id number]  HPI: Aaron Standage. is a 75 y.o. male  Chief Complaint  Patient presents with   Diabetes   Hyperlipidemia   Hypertension    3 month follow up   Diabetes: He says his fasting blood sugars are running around 133-155.  He denies any polyuria, polyphagia, or polydipsia.  He is currently taking jardiance 10 mg daily, glimepiride 4 mg daily, and metformin 1000 mg daily.  Last A1C was 7.7 on 09/24/20, will recheck today. He is up to date on foot exam and ophthalmology exam.   Hyperlipidemia: Last LDL was 32 on 02/23/20.  He is currently taking rosuvastatin 5 mg daily.  He denies any myalgia.  Will get labs today.  HTN: Blood pressure today 130/70.  He says his blood pressure at home have been 112/70,117/75,131/79.  He is currently taking losartan-hydrochlorothiazide 100-25mg  daily.  He denies any chest pain, shortness of breath, headaches, or blurred vision.    Relevant past medical, surgical, family and social history reviewed and updated as indicated. Interim medical history since our last visit reviewed. Allergies and medications reviewed and updated.  Review of Systems  Constitutional: Negative for fever or weight change.  Respiratory: Negative for cough and shortness of breath.   Cardiovascular: Negative for chest pain or palpitations.  Gastrointestinal: Negative for abdominal pain, no bowel changes.  Musculoskeletal: Negative for gait problem or joint swelling.  Skin: Negative for rash.  Neurological: Negative for dizziness or headache.  No other specific complaints in a complete review of systems (except as listed in HPI above).      Objective:    BP 130/70    Pulse 94    Temp 98.1 F (36.7 C) (Oral)    Resp 16    Ht 5\' 5"   (1.651 m)    Wt 159 lb 14.4 oz (72.5 kg)    SpO2 96%    BMI 26.61 kg/m   Wt Readings from Last 3 Encounters:  12/31/20 159 lb 14.4 oz (72.5 kg)  09/24/20 154 lb 9.6 oz (70.1 kg)  06/21/20 163 lb 1.6 oz (74 kg)    Physical Exam  Constitutional: Patient appears well-developed and well-nourished. No distress.  HEENT: head atraumatic, normocephalic, pupils equal and reactive to light, neck supple Cardiovascular: Normal rate, regular rhythm and normal heart sounds.  No murmur heard. No BLE edema. Pulmonary/Chest: Effort normal and breath sounds normal. No respiratory distress. Abdominal: Soft.  There is no tenderness. Psychiatric: Patient has a normal mood and affect. behavior is normal. Judgment and thought content normal.   Results for orders placed or performed in visit on 86/76/19  Basic Metabolic Panel (BMET)  Result Value Ref Range   Glucose, Bld 162 (H) 65 - 99 mg/dL   BUN 14 7 - 25 mg/dL   Creat 0.80 0.70 - 1.28 mg/dL   BUN/Creatinine Ratio NOT APPLICABLE 6 - 22 (calc)   Sodium 141 135 - 146 mmol/L   Potassium 3.8 3.5 - 5.3 mmol/L   Chloride 103 98 - 110 mmol/L   CO2 27 20 - 32 mmol/L  Calcium 9.6 8.6 - 10.3 mg/dL  POCT HgB A1C  Result Value Ref Range   Hemoglobin A1C 7.7 (A) 4.0 - 5.6 %   HbA1c POC (<> result, manual entry)     HbA1c, POC (prediabetic range)     HbA1c, POC (controlled diabetic range)        Assessment & Plan:   1. Hypertension goal BP (blood pressure) < 140/90  - CBC with Differential/Platelet - COMPLETE METABOLIC PANEL WITH GFR - losartan-hydrochlorothiazide (HYZAAR) 100-25 MG tablet; Take 1 tablet by mouth daily. For blood pressure  Dispense: 90 tablet; Refill: 1  2. Type 2 diabetes mellitus with diabetic neuropathy, without long-term current use of insulin (HCC)  - Hemoglobin A1c - empagliflozin (JARDIANCE) 10 MG TABS tablet; Take 1 tablet (10 mg total) by mouth daily.  Dispense: 90 tablet; Refill: 1 - metFORMIN (GLUCOPHAGE) 1000 MG tablet;  TAKE 1 TABLET BY MOUTH  TWICE DAILY WITH A MEAL  Dispense: 180 tablet; Refill: 1 - glimepiride (AMARYL) 4 MG tablet; TAKE 1 TABLET BY MOUTH  DAILY WITH BREAKFAST, HOLD  IF BLOOD SUGAR &lt;100 OR IF  NOT EATING  Dispense: 90 tablet; Refill: 1  3. Pure hypercholesterolemia  - Lipid panel - rosuvastatin (CRESTOR) 5 MG tablet; Take 1 tablet (5 mg total) by mouth at bedtime.  Dispense: 90 tablet; Refill: 1     Follow up plan: Return in about 3 months (around 03/31/2021) for follow up.

## 2021-01-01 LAB — COMPLETE METABOLIC PANEL WITH GFR
AG Ratio: 1.8 (calc) (ref 1.0–2.5)
ALT: 21 U/L (ref 9–46)
AST: 15 U/L (ref 10–35)
Albumin: 4.7 g/dL (ref 3.6–5.1)
Alkaline phosphatase (APISO): 60 U/L (ref 35–144)
BUN: 14 mg/dL (ref 7–25)
CO2: 25 mmol/L (ref 20–32)
Calcium: 9.3 mg/dL (ref 8.6–10.3)
Chloride: 103 mmol/L (ref 98–110)
Creat: 0.85 mg/dL (ref 0.70–1.28)
Globulin: 2.6 g/dL (calc) (ref 1.9–3.7)
Glucose, Bld: 90 mg/dL (ref 65–99)
Potassium: 3.7 mmol/L (ref 3.5–5.3)
Sodium: 139 mmol/L (ref 135–146)
Total Bilirubin: 0.7 mg/dL (ref 0.2–1.2)
Total Protein: 7.3 g/dL (ref 6.1–8.1)
eGFR: 90 mL/min/{1.73_m2} (ref >=60)

## 2021-01-01 LAB — HEMOGLOBIN A1C
Hgb A1c MFr Bld: 7.3 % of total Hgb — ABNORMAL HIGH (ref <5.7)
Mean Plasma Glucose: 163 mg/dL
eAG (mmol/L): 9 mmol/L

## 2021-01-01 LAB — CBC WITH DIFFERENTIAL/PLATELET
Absolute Monocytes: 554 cells/uL (ref 200–950)
Basophils Absolute: 32 cells/uL (ref 0–200)
Basophils Relative: 0.5 %
Eosinophils Absolute: 233 cells/uL (ref 15–500)
Eosinophils Relative: 3.7 %
HCT: 46.3 % (ref 38.5–50.0)
Hemoglobin: 15.6 g/dL (ref 13.2–17.1)
Lymphs Abs: 2652 cells/uL (ref 850–3900)
MCH: 31.5 pg (ref 27.0–33.0)
MCHC: 33.7 g/dL (ref 32.0–36.0)
MCV: 93.5 fL (ref 80.0–100.0)
MPV: 10.9 fL (ref 7.5–12.5)
Monocytes Relative: 8.8 %
Neutro Abs: 2829 cells/uL (ref 1500–7800)
Neutrophils Relative %: 44.9 %
Platelets: 198 10*3/uL (ref 140–400)
RBC: 4.95 10*6/uL (ref 4.20–5.80)
RDW: 12.4 % (ref 11.0–15.0)
Total Lymphocyte: 42.1 %
WBC: 6.3 10*3/uL (ref 3.8–10.8)

## 2021-01-01 LAB — LIPID PANEL
Cholesterol: 117 mg/dL (ref <200)
HDL: 60 mg/dL (ref >=40)
LDL Cholesterol (Calc): 38 mg/dL (calc)
Non-HDL Cholesterol (Calc): 57 mg/dL (calc) (ref <130)
Total CHOL/HDL Ratio: 2 (calc) (ref <5.0)
Triglycerides: 109 mg/dL (ref <150)

## 2021-01-16 ENCOUNTER — Ambulatory Visit (INDEPENDENT_AMBULATORY_CARE_PROVIDER_SITE_OTHER): Payer: HMO

## 2021-01-16 DIAGNOSIS — I10 Essential (primary) hypertension: Secondary | ICD-10-CM

## 2021-01-16 DIAGNOSIS — E1140 Type 2 diabetes mellitus with diabetic neuropathy, unspecified: Secondary | ICD-10-CM

## 2021-01-16 DIAGNOSIS — E785 Hyperlipidemia, unspecified: Secondary | ICD-10-CM

## 2021-01-16 DIAGNOSIS — E1169 Type 2 diabetes mellitus with other specified complication: Secondary | ICD-10-CM

## 2021-01-16 NOTE — Chronic Care Management (AMB) (Signed)
Chronic Care Management   CCM RN Visit Note  01/16/2021 Name: Aaron Calhoun. MRN: [id number] DOB: 1945/05/06  Subjective: Aaron Calhoun. is a 76 y.o. year old male who is a primary care patient of Bo Merino, FNP. The care management team was consulted for assistance with disease management and care coordination needs.    Engaged with patient by telephone for follow up visit in response to provider referral for case management and care coordination services.   Consent to Services:  The patient was given information about Chronic Care Management services, agreed to services, and gave verbal consent prior to initiation of services.  Please see initial visit note for detailed documentation.    Assessment: Review of patient past medical history, allergies, medications, health status, including review of consultants reports, laboratory and other test data, was performed as part of comprehensive evaluation and provision of chronic care management services.   SDOH (Social Determinants of Health) assessments and interventions performed: No  CCM Care Plan  No Known Allergies  Outpatient Encounter Medications as of 01/16/2021  Medication Sig   Cholecalciferol 25 MCG (1000 UT) tablet Take 1 tablet by mouth daily.   empagliflozin (JARDIANCE) 10 MG TABS tablet Take 1 tablet (10 mg total) by mouth daily.   finasteride (PROSCAR) 5 MG tablet Take 1 tablet (5 mg total) by mouth daily.   gabapentin (NEURONTIN) 300 MG capsule Take 1 capsule (300 mg total) by mouth 3 (three) times daily.   glimepiride (AMARYL) 4 MG tablet TAKE 1 TABLET BY MOUTH  DAILY WITH BREAKFAST, HOLD  IF BLOOD SUGAR &lt;100 OR IF  NOT EATING   glucose blood (ACCU-CHEK GUIDE) test strip Use as instructed   losartan-hydrochlorothiazide (HYZAAR) 100-25 MG tablet Take 1 tablet by mouth daily. For blood pressure   metFORMIN (GLUCOPHAGE) 1000 MG tablet TAKE 1 TABLET BY MOUTH  TWICE DAILY WITH A MEAL   rosuvastatin (CRESTOR) 5 MG  tablet Take 1 tablet (5 mg total) by mouth at bedtime.   sildenafil (VIAGRA) 50 MG tablet Take 1-2 tablets (50-100 mg total) by mouth daily as needed for erectile dysfunction (take 30 min before sexual activity).   tamsulosin (FLOMAX) 0.4 MG CAPS capsule Take 1 capsule (0.4 mg total) by mouth daily.   No facility-administered encounter medications on file as of 01/16/2021.    Patient Active Problem List   Diagnosis Date Noted   Other personal history presenting hazards to health 02/23/2020   Vertigo 08/16/2019   Penile irritation 03/24/2016   Lumbar spinal stenosis 04/22/2015   Uncontrolled type 2 diabetes mellitus 11/05/2014   Hypertension goal BP (blood pressure) < 140/90 11/05/2014   Hyperlipidemia 11/05/2014   Benign prostate hyperplasia 11/05/2014    Patient Care Plan: RN Care Management Plan of Care     Problem Identified: Hypertension, Hyperlipidemia, Type 2 Diabetes      Long-Range Goal: Disease Progression Prevented or Minimized   Start Date: 11/27/2020  Expected End Date: 02/25/2021  Priority: High  Note:   Current Barriers:  Chronic Disease Management support and education needs related to HTN, HLD, and DMII  RNCM Clinical Goal(s):  Patient will demonstrate Improved adherence to prescribed treatment plan for HTN, HLD, and DMII through collaboration with the provider and care management team.  Interventions: 1:1 collaboration with primary care provider regarding development and update of comprehensive plan of care as evidenced by provider attestation and co-signature Inter-disciplinary care team collaboration (see longitudinal plan of care) Evaluation of current treatment plan related to  self  management and patient's adherence to plan as established by provider  Hypertension Interventions: Last practice recorded BP readings:  BP Readings from Last 3 Encounters:  12/31/20 130/70  09/24/20 122/62  06/21/20 (!) 142/78    Most recent eGFR/CrCl: No results found  for: EGFR  No components found for: CRCL  Reviewed medications and compliance with treatment plan. Reports excellent compliance with medications and monitoring blood pressure as advised. Reports home readings have been in range. Systolic readings have ranged in the 120's and 130's. Diastolic readings have ranged in the 70's and 80's.  Reviewed compliance with recommended cardiac prudent diet. Encouraged to read nutrition labels, monitor sodium intake and avoid highly processed foods when possible. Reviewed complications of uncontrolled blood pressure.  Reviewed s/sx of heart attack, stroke and worsening symptoms that require immediate medical attention.    Hyperlipidemia Interventions: Lab Results  Component Value Date   CHOL 117 12/31/2020   HDL 60 12/31/2020   LDLCALC 38 12/31/2020   TRIG 109 12/31/2020   CHOLHDL 2.0 12/31/2020    Reviewed medications. Currently tolerating statin well. Advised to notify a provider if unable to tolerate the regimen. Reviewed established cholesterol goals Discussed importance of completing regular labs as scheduled Discussed strategies to manage statin-induced myalgias Reviewed importance of limiting foods high in cholesterol  Diabetes Interventions: Lab Results  Component Value Date   HGBA1C 7.7 (A) 09/24/2020   Lab Results  Component Value Date   HGBA1C 7.3 (H) 12/31/2020    Reviewed medications. Reports taking as prescribed. Reports currently working the [employer] to receive assistance with Jardiance.  Provided information regarding importance of consistent blood glucose monitoring. Currently monitors levels a few times a week. Reports a few elevated readings in the 160's and 170's. Most readings have ranged in the 80's to 140's. Advised to continue monitoring and maintaining a log. Reviewed s/sx of hypoglycemia and hyperglycemia. Reviewed  nutritional intake. Reports attempting to adhere to the recommended ADA/modified carb diet. Declined  need for diabetic nutritional resources.     Patient Goals/Self-Care Activities: Patient will self administer medications as prescribed Patient will attend all scheduled provider appointments Patient will call pharmacy for medication refills Patient will continue to perform ADL's independently Patient will continue to perform IADL's independently Patient will call provider office for new concerns or questions   Follow Up Plan:   Will follow up in two months      PLAN A member of the care management team will follow up in two months.   Cristy [facility] Care Management [facility] [phone]

## 2021-01-20 ENCOUNTER — Telehealth: Payer: Self-pay

## 2021-01-20 NOTE — Progress Notes (Signed)
Chronic Care Management Pharmacy Assistant   Name: Aaron Calhoun.  MRN: [id number] DOB: 21-Dec-1945  Reason for Encounter: Hypertension Disease State Call   Recent office visits:  12/31/2020 Aaron Calhoun, [name] (PCP Office Visit) for Follow-up- No medication changes noted, Lab orders placed, patient instructed to follow-up in 3 months  Recent consult visits:  None ID  Hospital visits:  None in previous 6 months  Medications: Outpatient Encounter Medications as of 01/20/2021  Medication Sig   Cholecalciferol 25 MCG (1000 UT) tablet Take 1 tablet by mouth daily.   empagliflozin (JARDIANCE) 10 MG TABS tablet Take 1 tablet (10 mg total) by mouth daily.   finasteride (PROSCAR) 5 MG tablet Take 1 tablet (5 mg total) by mouth daily.   gabapentin (NEURONTIN) 300 MG capsule Take 1 capsule (300 mg total) by mouth 3 (three) times daily.   glimepiride (AMARYL) 4 MG tablet TAKE 1 TABLET BY MOUTH  DAILY WITH BREAKFAST, HOLD  IF BLOOD SUGAR &lt;100 OR IF  NOT EATING   glucose blood (ACCU-CHEK GUIDE) test strip Use as instructed   losartan-hydrochlorothiazide (HYZAAR) 100-25 MG tablet Take 1 tablet by mouth daily. For blood pressure   metFORMIN (GLUCOPHAGE) 1000 MG tablet TAKE 1 TABLET BY MOUTH  TWICE DAILY WITH A MEAL   rosuvastatin (CRESTOR) 5 MG tablet Take 1 tablet (5 mg total) by mouth at bedtime.   sildenafil (VIAGRA) 50 MG tablet Take 1-2 tablets (50-100 mg total) by mouth daily as needed for erectile dysfunction (take 30 min before sexual activity).   tamsulosin (FLOMAX) 0.4 MG CAPS capsule Take 1 capsule (0.4 mg total) by mouth daily.   No facility-administered encounter medications on file as of 01/20/2021.   Care Gaps: None ID  Star Rating Drugs: [name] 10 mg last filled on 06/24/2020 for a 90-Day supply with [name] Losartan-HCTZ 100-25 mg last filled on 09/17/2020 for a 90-Day supply with [place] Metformin 1000 mg last filled on 09/17/2020  for a 90-Day supply with [name] Rosuvastatin 5 mg last filled on 09/17/2020 for a 90-Day supply with [name] Glimepiride 4 mg last filled on 09/17/2020 for a 90-Day supply with [name]  Patient is still waiting to hear back from [name] regarding his [name] application for his [name]. At this time he is not taking Jardiance as it is too expensive and he can not get patient assistance until he is denied by medicare for LIS.  Reviewed chart prior to disease state call. Spoke with patient regarding BP  Recent Office Vitals: BP Readings from Last 3 Encounters:  12/31/20 130/70  09/24/20 122/62  06/21/20 (!) 142/78   Pulse Readings from Last 3 Encounters:  12/31/20 94  09/24/20 70  06/21/20 64    Wt Readings from Last 3 Encounters:  12/31/20 159 lb 14.4 oz (72.5 kg)  09/24/20 154 lb 9.6 oz (70.1 kg)  06/21/20 163 lb 1.6 oz (74 kg)     Kidney Function Lab Results  Component Value Date/Time   CREATININE 0.85 12/31/2020 01:43 PM   CREATININE 0.80 09/24/2020 09:09 AM   GFRNONAA 85 02/23/2020 08:08 AM   GFRAA 99 02/23/2020 08:08 AM    BMP Latest Ref Rng & Units 12/31/2020 09/24/2020 02/23/2020  Glucose 65 - 99 mg/dL 90 162(H) 169(H)  BUN 7 - 25 mg/dL 14 14 16   Creatinine 0.70 - 1.28 mg/dL 0.85 0.80 0.85  BUN/Creat Ratio 6 - 22 (calc)  NOT APPLICABLE NOT APPLICABLE NOT APPLICABLE  Sodium 854 - 146 mmol/L 139 141 140  Potassium 3.5 - 5.3 mmol/L 3.7 3.8 3.8  Chloride 98 - 110 mmol/L 103 103 102  CO2 20 - 32 mmol/L 25 27 29   Calcium 8.6 - 10.3 mg/dL 9.3 9.6 9.7    Current antihypertensive regimen:  Losartan-HCTZ 100-25 1 tablet daily  How often are you checking your Blood Pressure? 3-5x per week  Current home BP readings: 117/75, 131/79, 130/70  What recent interventions/DTPs have been made by any provider to improve Blood Pressure control since last CPP Visit: None ID  Any recent hospitalizations  or ED visits since last visit with CPP? No  What diet changes have been made to improve Blood Pressure Control?  Patient just watches his sugar,carb, salt and sodium intake  What exercise is being done to improve your Blood Pressure Control?  Patient does not have a exercise routine. 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  Adherence Review: Is the patient currently on ACE/ARB medication? Yes Does the patient have >5 day gap between last estimated fill dates? No  Patient reports that he is doing well. He denies any current ill symptoms. He reports that all his numbers HTN and DM have been normal. Patient stated he saw PCP last month, and his visit went well. Patient is taking all medications as directed, and denies needing any refills. Patient has no concerns, or issues at this time. Patient advised to contact me once he receives notification from Social Security regarding the status of his LIS Application. Patient verbalized understanding to all.    Patient has scheduled telephone appointment with Aaron Calhoun, CPP on 02/12/2021 @ 1100 Patient has AWV scheduled for 03/13/2021 @ 0800  Aaron Calhoun, CPA/CMA Catering manager Phone: [phone]

## 2021-02-11 ENCOUNTER — Telehealth: Payer: Self-pay

## 2021-02-11 DIAGNOSIS — E1140 Type 2 diabetes mellitus with diabetic neuropathy, unspecified: Secondary | ICD-10-CM

## 2021-02-11 DIAGNOSIS — E785 Hyperlipidemia, unspecified: Secondary | ICD-10-CM

## 2021-02-11 DIAGNOSIS — E1169 Type 2 diabetes mellitus with other specified complication: Secondary | ICD-10-CM

## 2021-02-11 DIAGNOSIS — I10 Essential (primary) hypertension: Secondary | ICD-10-CM

## 2021-02-11 NOTE — Progress Notes (Signed)
° ° °  Chronic Care Management Pharmacy Assistant   Name: Aaron Calhoun.  MRN: [id number] DOB: 02/26/1945  Patient called to be reminded of his telephone appointment with Aaron Calhoun, CPP on 02/12/2021 @ 1100  Patient aware of appointment date, time, and type of appointment (either telephone or in person). Patient aware to have/bring all medications, supplements, blood pressure and/or blood sugar logs to visit.  Questions: Are there any concerns you would like to discuss during your office visit? Not at this time  Are you having any problems obtaining your medications? Just Jardiance still waiting on LIS Application to be approved or denied.   Star Rating Drug: Jardiance 10 mg last filled 06/24/2020 for a 90-Day supply (patient has been trying to get patient assistance for this medication. LIS application completed as he has to be denied by LIS before [employer] will approve him for [place] Losartan-HCTZ 100-25 mg last filled on 01/03/2021 for a 90-Day supply with [place] Metformin 1000 mg   last filled on 01/03/2021 for a 90-Day supply with [place] Rosuvastatin 5 mg   last filled on 01/03/2021 for a 90-Day supply with [place] Glimepiride 4 mg   last filled on 01/03/2021 for a 90-Day supply with [place]   Any gaps in medications fill history? No, I contacted the patient's pharmacy and was able to get the last correct fill date as Epic is incorrect.   Care Gaps: None   Lynann Bologna, CPA/CMA Clinical Pharmacist Assistant Phone: [phone]

## 2021-02-12 ENCOUNTER — Ambulatory Visit (INDEPENDENT_AMBULATORY_CARE_PROVIDER_SITE_OTHER): Payer: HMO

## 2021-02-12 DIAGNOSIS — I10 Essential (primary) hypertension: Secondary | ICD-10-CM

## 2021-02-12 DIAGNOSIS — E1140 Type 2 diabetes mellitus with diabetic neuropathy, unspecified: Secondary | ICD-10-CM

## 2021-02-12 NOTE — Progress Notes (Signed)
Chronic Care Management Pharmacy Note  02/12/2021 Name:  Aaron Calhoun. MRN:  [id number] DOB:  [birth year]  Subjective:  Summary: Patient presents for follow-up.  -Patient is back taking Jardiance now that he is not in [facility]. Patient has yet to hear back from [facility] regarding his LIS application.   Recommendations/Changes made from today's visit: Continue Current medications -Will switch patient to [place] if haven't heard back regarding Jardiance PAP or LIS application.   Plan: CPP follow-up in 3 months   Recommended Problem List Changes:    Aaron Weant. is an 76 y.o. year old male who is a primary patient of Bo Merino, FNP.  The CCM team was consulted for assistance with disease management and care coordination needs.    Engaged with patient by telephone for follow up visit in response to provider referral for pharmacy case management and/or care coordination services.   Consent to Services:  The patient was given information about Chronic Care Management services, agreed to services, and gave verbal consent prior to initiation of services.  Please see initial visit note for detailed documentation.   Patient Care Team: Bo Merino, FNP as PCP - General (Nurse Practitioner) Germaine Pomfret, [facility] (Pharmacist) Neldon Labella, RN as Case Manager  Recent office visits: 12/31/20: Patient presented to [name], FNP for follow-up. A1c 7.3%.   09/24/20: Patient presented to Dr. Ky Barban for follow-up. A1c improved to 7.7%.   Recent consult visits: None in previous 6 months  Hospital visits: None in previous 6 months   Objective:  Lab Results  Component Value Date   CREATININE 0.85 12/31/2020   BUN 14 12/31/2020   GFRNONAA 85 02/23/2020   GFRAA 99 02/23/2020   NA 139 12/31/2020   K 3.7 12/31/2020   CALCIUM 9.3 12/31/2020   CO2 25 12/31/2020   GLUCOSE 90 12/31/2020    Lab Results  Component Value Date/Time   HGBA1C 7.3 (H) 12/31/2020 01:43 PM    HGBA1C 7.7 (A) 09/24/2020 09:00 AM   HGBA1C 7.9 (A) 06/21/2020 07:49 AM   HGBA1C 8.4 (H) 02/23/2020 08:08 AM   HGBA1C 8.7 01/25/2020 12:00 AM   MICROALBUR <0.2 10/21/2018 12:00 AM   MICROALBUR 0.9 08/11/2017 09:33 AM   MICROALBUR NEG 07/19/2015 10:20 AM   MICROALBUR 20 07/03/2014 03:32 PM    Last diabetic Eye exam:  Lab Results  Component Value Date/Time   HMDIABEYEEXA No Retinopathy 03/06/2019 12:00 AM    Last diabetic Foot exam: No results found for: HMDIABFOOTEX   Lab Results  Component Value Date   CHOL 117 12/31/2020   HDL 60 12/31/2020   LDLCALC 38 12/31/2020   TRIG 109 12/31/2020   CHOLHDL 2.0 12/31/2020    Hepatic Function Latest Ref Rng & Units 12/31/2020 02/23/2020 08/23/2019  Total Protein 6.1 - 8.1 g/dL 7.3 7.2 7.1  Albumin 3.6 - 5.1 g/dL - - -  AST 10 - 35 U/L _0 ALT 9 - 46 U/L _1 Alk Phosphatase 40 - 115 U/L - - -  Total Bilirubin 0.2 - 1.2 mg/dL 0.7 0.7 0.5    Lab Results  Component Value Date/Time   TSH 1.47 04/01/2016 12:07 PM   TSH 2.190 11/05/2014 11:04 AM    CBC Latest Ref Rng & Units 12/31/2020 08/12/2019 10/21/2018  WBC 3.8 - 10.8 Thousand/uL 6.3 6.4 5.1  Hemoglobin 13.2 - 17.1 g/dL 15.6 15.0 16.0  Hematocrit 38.5 - 50.0 % 46.3 43.9 46.9  Platelets 140 - 400  Thousand/uL 198 222 212    Lab Results  Component Value Date/Time   VD25OH 32.3 01/25/2020 12:00 AM   VD25OH 35 04/01/2016 12:07 PM    Clinical ASCVD: No  The ASCVD Risk score (Arnett DK, et al., 2019) failed to calculate for the following reasons:   The valid total cholesterol range is 130 to 320 mg/dL    Depression screen [facility] 2/9 12/31/2020 11/27/2020 09/24/2020  Decreased Interest 0 0 0  Down, Depressed, Hopeless 0 0 0  PHQ - 2 Score 0 0 0  Altered sleeping - - 0  Tired, decreased energy - - 0  Change in appetite - - 0  Feeling bad or failure about yourself  - - 0  Trouble concentrating - - 0  Moving slowly or fidgety/restless - - 0  Suicidal thoughts - - 0   PHQ-9 Score - - 0  Difficult doing work/chores - - Not difficult at all  Some recent data might be hidden    Social History   Tobacco Use  Smoking Status Never  Smokeless Tobacco Former   Types: Snuff   Quit date: 1980  Tobacco Comments   smoking cesation materials not required   BP Readings from Last 3 Encounters:  12/31/20 130/70  09/24/20 122/62  06/21/20 (!) 142/78   Pulse Readings from Last 3 Encounters:  12/31/20 94  09/24/20 70  06/21/20 64   Wt Readings from Last 3 Encounters:  12/31/20 159 lb 14.4 oz (72.5 kg)  09/24/20 154 lb 9.6 oz (70.1 kg)  06/21/20 163 lb 1.6 oz (74 kg)   BMI Readings from Last 3 Encounters:  12/31/20 26.61 kg/m  09/24/20 25.73 kg/m  06/21/20 27.14 kg/m    Assessment/Interventions: Review of patient past medical history, allergies, medications, health status, including review of consultants reports, laboratory and other test data, was performed as part of comprehensive evaluation and provision of chronic care management services.   SDOH:  (Social Determinants of Health) assessments and interventions performed: No SDOH Interventions    Flowsheet Row Most Recent Value  SDOH Interventions   Financial Strain Interventions Intervention Not Indicated       SDOH Screenings   Alcohol Screen: Low Risk    Last Alcohol Screening Score (AUDIT): 0  Depression (PHQ2-9): Low Risk    PHQ-2 Score: 0  Financial Resource Strain: Medium Risk   Difficulty of Paying Living Expenses: Somewhat hard  Food Insecurity: No Food Insecurity   Worried About Charity fundraiser in the Last Year: Never true   Ran Out of Food in the Last Year: Never true  Housing: Low Risk    Last Housing Risk Score: 0  Physical Activity: Sufficiently Active   Days of Exercise per Week: 7 days   Minutes of Exercise per Session: 30 min  Social Connections: Moderately Integrated   Frequency of Communication with Friends and Family: More than three times a week    Frequency of Social Gatherings with Friends and Family: Twice a week   Attends Religious Services: More than 4 times per year   Active Member of [employer] or Organizations: No   Attends Music therapist: Never   Marital Status: Married  Stress: No Stress Concern Present   Feeling of Stress : Not at all  Tobacco Use: Medium Risk   Smoking Tobacco Use: Never   Smokeless Tobacco Use: Former   Passive Exposure: Not on Pensions consultant Needs: No Data processing manager (Medical): No  Lack of Transportation (Non-Medical): No    CCM Care Plan  No Known Allergies  Medications Reviewed Today     Reviewed by Neldon Labella, RN (Registered Nurse) on 01/16/21 at 1321  Med List Status: <None>   Medication Order Taking? Sig Documenting Provider Last Dose Status Informant  Cholecalciferol 25 MCG (1000 UT) tablet [id number] No Take 1 tablet by mouth daily. [provider] Taking Active   empagliflozin (JARDIANCE) 10 MG TABS tablet [id number]  Take 1 tablet (10 mg total) by mouth daily. Bo Merino, FNP  Active   finasteride (PROSCAR) 5 MG tablet [id number] No Take 1 tablet (5 mg total) by mouth daily. Kathrine Haddock, NP Taking Active   gabapentin (NEURONTIN) 300 MG capsule [id number] No Take 1 capsule (300 mg total) by mouth 3 (three) times daily. Kathrine Haddock, NP Taking Active   glimepiride (AMARYL) 4 MG tablet [id number]  TAKE 1 TABLET BY MOUTH  DAILY WITH BREAKFAST, HOLD  IF BLOOD SUGAR &lt;100 OR IF  NOT EATING Bo Merino, FNP  Active   glucose blood (ACCU-CHEK GUIDE) test strip [id number] No Use as instructed Delsa Grana, PA-C Taking Active   losartan-hydrochlorothiazide (HYZAAR) 100-25 MG tablet [id number]  Take 1 tablet by mouth daily. For blood pressure Bo Merino, FNP  Active   metFORMIN (GLUCOPHAGE) 1000 MG tablet [id number]  TAKE 1 TABLET BY MOUTH  TWICE DAILY WITH A MEAL Bo Merino, FNP  Active   rosuvastatin (CRESTOR) 5 MG  tablet [id number]  Take 1 tablet (5 mg total) by mouth at bedtime. Bo Merino, FNP  Active   sildenafil (VIAGRA) 50 MG tablet [id number] No Take 1-2 tablets (50-100 mg total) by mouth daily as needed for erectile dysfunction (take 30 min before sexual activity). Delsa Grana, PA-C Taking Active   tamsulosin (FLOMAX) 0.4 MG CAPS capsule [id number] No Take 1 capsule (0.4 mg total) by mouth daily. Kathrine Haddock, NP Taking Active             Patient Active Problem List   Diagnosis Date Noted   Other personal history presenting hazards to health 02/23/2020   Vertigo 08/16/2019   Penile irritation 03/24/2016   Lumbar spinal stenosis 04/22/2015   Type 2 diabetes mellitus with diabetic neuropathy, without long-term current use of insulin ([place]) 11/05/2014   Uncontrolled type 2 diabetes mellitus 11/05/2014   Hypertension goal BP (blood pressure) < 140/90 11/05/2014   Hyperlipidemia 11/05/2014   Benign prostate hyperplasia 11/05/2014    Immunization History  Administered Date(s) Administered   Fluad Quad(high Dose 65+) 10/19/2018   Influenza,inj,Quad PF,6+ Mos 10/09/2015, 10/02/2020   Influenza-Unspecified 10/12/2008, 10/02/2014, 10/16/2016, 10/07/2017, 09/27/2019, 10/22/2019   Moderna Sars-Covid-2 Vaccination 02/24/2019, 03/27/2019, 11/14/2019, 04/15/2020   Pneumococcal Conjugate-13 02/19/2017   Pneumococcal Polysaccharide-23 11/30/2008, 06/17/2018   Tdap 11/22/2009   Zoster Recombinat (Shingrix) 05/24/2019, 07/26/2019    Conditions to be addressed/monitored:  Hypertension, Hyperlipidemia, Diabetes and BPH  Care Plan : General Pharmacy (Adult)  Updates made by Germaine Pomfret, RPH since 02/12/2021 12:00 AM     Problem: Hypertension, Hyperlipidemia, Diabetes and BPH   Priority: High     Long-Range Goal: Patient-Specific Goal   Start Date: 04/04/2020  Expected End Date: 10/16/2021  This Visit's Progress: On track  Recent Progress: Not on track  Priority: High  Note:    Current Barriers:  Unable to achieve control of diabetes   Pharmacist Clinical Goal(s):  Patient will achieve control of diabetes as evidenced by A1c less than 8% through collaboration  with PharmD and provider.   Interventions: 1:1 collaboration with Delsa Grana, PA-C regarding development and update of comprehensive plan of care as evidenced by provider attestation and co-signature Inter-disciplinary care team collaboration (see longitudinal plan of care) Comprehensive medication review performed; medication list updated in electronic medical record  Hypertension (BP goal <140/90) -Controlled -Current treatment: Losartan-HCTZ 100-25 mg daily: Appropriate, Effective, Safe, Accessible -Medications previously tried: NA  -Current home readings:   112/72  111/62  140/78; 132/78 on recheck  -Denies hypotensive/hypertensive symptoms -Educated on Daily salt intake goal < 2300 mg; Exercise goal of 150 minutes per week; -Counseled to monitor BP at home weekly, document, and provide log at future appointments -Recommended to continue current medication  Hyperlipidemia: (LDL goal < 70) -Controlled -Current treatment: Rosuvastatin 5 mg nightly  -Medications previously tried: NA  -Educated on Importance of limiting foods high in cholesterol; -Recommended to continue current medication  Diabetes (A1c goal <8%) -Controlled -Current medications: Glimepiride 4 mg daily: Appropriate, Effective, Query Safe Metformin 1000 mg twice daily: Appropriate, Effective, Safe, Accessible  Jardiance 10 mg daily: Appropriate, Effective, Safe, Query accessible  -Medications previously tried: Januvia   -Patient is back taking Jardiance now that he is not in [place]. Patient has yet to hear back from [facility] regarding his LIS application.  -Current home glucose readings:  Morning Afternoon  31-Jan 150 107  27-Jan 135 116  23-Jan 146 113  20-Jan 136 130  18-Jan 138 100  Average 141 113  -Denies  hypoglycemic/hyperglycemic symptoms -Current meal patterns:  breakfast: Cereal + fruit + milk   lunch: Varies   dinner: Fried Chicken + Mashed Potatoes + glass of sweet tea.  snacks: Grapes (5-6) before bed.  drinks: water, will sometimes do vinegar sips  -Current exercise: Walking daily (20 minutes)  -Risk of hypoglycemia with glimepiride, patient reports one instance of glucose of 85, patient was asymptomatic.  -Will switch patient to [place] if haven't heard back regarding Jardiance PAP or LIS application.  -Recommended to continue current medication  BPH (Goal: achieve symptom relief) -Controlled -Current treatment  Finasteride 5 mg daily  Tamsulosin 0.4 mg daily  -Medications previously tried: NA -Recommended to continue current medication  Patient Goals/Self-Care Activities Patient will:  - check glucose twice daily (before breakfast and before supper), document, and provide at future appointments check blood pressure weekly, document, and provide at future appointments  Follow Up Plan: Telephone follow up appointment with care management team member scheduled for:  05/28/2021 at 11:00 AM      Compliance/Adherence/Medication fill history: Care Gaps: None ID  Star-Rating Drugs: Jardiance 10 mg last filled on 06/24/2020 for a 90-Day supply with [facility] Rosuvastatin 5 mg last filled on 09/17/2020 for a 90-Day supply with [facility] 100-25 mg last filled on 09/17/2020 for a 90-Day supply with [facility] Glimepiride 4 mg last filled on 09/17/2020 for a 90-Day supply with [facility] Metformin 1000 mg last filled on 09/17/2020 for a 90-Day supply with [facility]  Medication Assistance: Application for Jardiance  medication assistance program. in process.  Anticipated assistance start date TBD.  See plan of care for additional detail.  Patient's preferred pharmacy is:  [facility] [address], [place] - [place] [address] [place] [address] Phone: [phone] Fax: [phone]  [facility] - [place], [place] [address] Phone:  [phone] Fax: [phone]  Uses pill box? Yes Pt endorses 100% compliance  We discussed: Current pharmacy is preferred with insurance plan and patient is satisfied with pharmacy services Patient decided to: Continue current medication management strategy  Care Plan and Follow Up Patient Decision:  Patient agrees to Care Plan and Follow-up.  Plan: Telephone follow up appointment with care management team member scheduled for:  05/28/2021 at 11:00 AM  [name], [place] Pharmacist Practitioner  [facility] [phone]

## 2021-02-12 NOTE — Patient Instructions (Signed)
Visit Information It was great speaking with you today!  Please let me know if you have any questions about our visit.   Goals Addressed             This Visit's Progress    Monitor and Manage My Blood Sugar-Diabetes Type 2   On track    Timeframe:  Long-Range Goal Priority:  High Start Date:   04/04/2020                           Expected End Date:   10/05/2021                    Follow Up within 30 days   - check blood sugar at prescribed times - enter blood sugar readings and medication or insulin into daily log - take the blood sugar log to all doctor visits    Why is this important?   Checking your blood sugar at home helps to keep it from getting very high or very low.  Writing the results in a diary or log helps the doctor know how to care for you.  Your blood sugar log should have the time, date and the results.  Also, write down the amount of insulin or other medicine that you take.  Other information, like what you ate, exercise done and how you were feeling, will also be helpful.     Notes:         Patient Care Plan: General Pharmacy (Adult)     Problem Identified: Hypertension, Hyperlipidemia, Diabetes and BPH   Priority: High     Long-Range Goal: Patient-Specific Goal   Start Date: 04/04/2020  Expected End Date: 10/16/2021  This Visit's Progress: On track  Recent Progress: Not on track  Priority: High  Note:   Current Barriers:  Unable to achieve control of diabetes   Pharmacist Clinical Goal(s):  Patient will achieve control of diabetes as evidenced by A1c less than 8% through collaboration with PharmD and provider.   Interventions: 1:1 collaboration with Delsa Grana, PA-C regarding development and update of comprehensive plan of care as evidenced by provider attestation and co-signature Inter-disciplinary care team collaboration (see longitudinal plan of care) Comprehensive medication review performed; medication list updated in electronic medical  record  Hypertension (BP goal <140/90) -Controlled -Current treatment: Losartan-HCTZ 100-25 mg daily: Appropriate, Effective, Safe, Accessible -Medications previously tried: NA  -Current home readings:   112/72  111/62  140/78; 132/78 on recheck  -Denies hypotensive/hypertensive symptoms -Educated on Daily salt intake goal < 2300 mg; Exercise goal of 150 minutes per week; -Counseled to monitor BP at home weekly, document, and provide log at future appointments -Recommended to continue current medication  Hyperlipidemia: (LDL goal < 70) -Controlled -Current treatment: Rosuvastatin 5 mg nightly  -Medications previously tried: NA  -Educated on Importance of limiting foods high in cholesterol; -Recommended to continue current medication  Diabetes (A1c goal <8%) -Controlled -Current medications: Glimepiride 4 mg daily: Appropriate, Effective, Query Safe Metformin 1000 mg twice daily: Appropriate, Effective, Safe, Accessible  Jardiance 10 mg daily: Appropriate, Effective, Safe, Query accessible  -Medications previously tried: Januvia   -Patient is back taking Jardiance now that he is not in [place]. Patient has yet to hear back from [facility] regarding his LIS application.  -Current home glucose readings:  Morning Afternoon  31-Jan 150 107  27-Jan 135 116  23-Jan 146 113  20-Jan 136 130  18-Jan 138 100  Average 141 113  -Denies hypoglycemic/hyperglycemic symptoms -Current meal patterns:  breakfast: Cereal + fruit + milk   lunch: Varies   dinner: Fried Chicken + Mashed Potatoes + glass of sweet tea.  snacks: Grapes (5-6) before bed.  drinks: water, will sometimes do vinegar sips  -Current exercise: Walking daily (20 minutes)  -Risk of hypoglycemia with glimepiride, patient reports one instance of glucose of 85, patient was asymptomatic.  -Will switch patient to [place] if haven't heard back regarding Jardiance PAP or LIS application.  -Recommended to continue current  medication  BPH (Goal: achieve symptom relief) -Controlled -Current treatment  Finasteride 5 mg daily  Tamsulosin 0.4 mg daily  -Medications previously tried: NA -Recommended to continue current medication  Patient Goals/Self-Care Activities Patient will:  - check glucose twice daily (before breakfast and before supper), document, and provide at future appointments check blood pressure weekly, document, and provide at future appointments  Follow Up Plan: Telephone follow up appointment with care management team member scheduled for:  05/28/2021 at 11:00 AM    Patient agreed to services and verbal consent obtained.   The patient verbalized understanding of instructions, educational materials, and care plan provided today and declined offer to receive copy of patient instructions, educational materials, and care plan.   [name], [place] Pharmacist Practitioner  [facility] [phone]

## 2021-02-13 LAB — BASIC METABOLIC PANEL
BUN: 14 (ref 4–21)
CO2: 27 — AB (ref 13–22)
Chloride: 104 (ref 99–108)
Creatinine: 0.9 (ref 0.6–1.3)
Glucose: 115
Potassium: 3.6 mEq/L (ref 3.5–5.1)
Sodium: 138 (ref 137–147)

## 2021-02-13 LAB — HEMOGLOBIN A1C: Hemoglobin A1C: 7.2

## 2021-02-13 LAB — COMPREHENSIVE METABOLIC PANEL
Calcium: 9.5 (ref 8.7–10.7)
eGFR: 89

## 2021-02-13 LAB — VITAMIN D 25 HYDROXY (VIT D DEFICIENCY, FRACTURES): Vit D, 25-Hydroxy: 24.1

## 2021-02-13 LAB — PSA: PSA: 0.31

## 2021-02-24 ENCOUNTER — Other Ambulatory Visit: Payer: Self-pay

## 2021-02-24 ENCOUNTER — Encounter: Payer: Self-pay | Admitting: Nurse Practitioner

## 2021-02-24 ENCOUNTER — Telehealth: Payer: Self-pay

## 2021-02-24 ENCOUNTER — Telehealth (INDEPENDENT_AMBULATORY_CARE_PROVIDER_SITE_OTHER): Payer: HMO | Admitting: Nurse Practitioner

## 2021-02-24 VITALS — BP 138/84 | HR 94 | Temp 98.1°F | Resp 16 | Wt 164.0 lb

## 2021-02-24 DIAGNOSIS — N529 Male erectile dysfunction, unspecified: Secondary | ICD-10-CM | POA: Insufficient documentation

## 2021-02-24 DIAGNOSIS — J0140 Acute pansinusitis, unspecified: Secondary | ICD-10-CM | POA: Diagnosis not present

## 2021-02-24 DIAGNOSIS — G8928 Other chronic postprocedural pain: Secondary | ICD-10-CM | POA: Insufficient documentation

## 2021-02-24 DIAGNOSIS — R7989 Other specified abnormal findings of blood chemistry: Secondary | ICD-10-CM | POA: Insufficient documentation

## 2021-02-24 DIAGNOSIS — R109 Unspecified abdominal pain: Secondary | ICD-10-CM | POA: Insufficient documentation

## 2021-02-24 DIAGNOSIS — H269 Unspecified cataract: Secondary | ICD-10-CM | POA: Insufficient documentation

## 2021-02-24 DIAGNOSIS — G8929 Other chronic pain: Secondary | ICD-10-CM | POA: Insufficient documentation

## 2021-02-24 MED ORDER — AMOXICILLIN-POT CLAVULANATE 875-125 MG PO TABS: 1.0000 | ORAL_TABLET | Freq: Two times a day (BID) | ORAL | 0 refills | Status: AC

## 2021-02-24 NOTE — Telephone Encounter (Signed)
Pt would like one touch ultra 2 test strips and accu check softe clix sent to [facility].

## 2021-02-24 NOTE — Progress Notes (Signed)
BP 138/84    Pulse 94    Temp 98.1 F (36.7 C)    Resp 16    Wt 164 lb (74.4 kg)    SpO2 98%    BMI 27.29 kg/m    Subjective:    Patient ID: Aaron Simmonds., male    DOB: April 10, 1945, 76 y.o.   MRN: [id number]  HPI: Aaron Freeland. is a 76 y.o. male  Chief Complaint  Patient presents with   Sinusitis   Headache    For 1 week   Sinus infection:  He says he has had sinus congestion for about two weeks it started to get better and then he got head and sinus pressure. He says his headache is just a band across his face.   He denies fever, cough or shortness of breath. He says his face feels full.  He says he has been taking a sinus pill.  He does have facial tenderness on palpation. Discussed treatment options.  Will send in antibiotic.  Discussed OTC treatments that include mucinex,  zyrtec and flonase.  Relevant past medical, surgical, family and social history reviewed and updated as indicated. Interim medical history since our last visit reviewed. Allergies and medications reviewed and updated.  Review of Systems  Constitutional: Negative for fever or weight change.  HEENT: positive for facial pain, tenderness and nasal congestion Respiratory: Negative for cough and shortness of breath.   Cardiovascular: Negative for chest pain or palpitations.  Gastrointestinal: Negative for abdominal pain, no bowel changes.  Musculoskeletal: Negative for gait problem or joint swelling.  Skin: Negative for rash.  Neurological: Negative for dizziness, positive headache.  No other specific complaints in a complete review of systems (except as listed in HPI above).      Objective:    BP 138/84    Pulse 94    Temp 98.1 F (36.7 C)    Resp 16    Wt 164 lb (74.4 kg)    SpO2 98%    BMI 27.29 kg/m   Wt Readings from Last 3 Encounters:  02/24/21 164 lb (74.4 kg)  12/31/20 159 lb 14.4 oz (72.5 kg)  09/24/20 154 lb 9.6 oz (70.1 kg)    Physical Exam  Constitutional: Patient appears  well-developed and well-nourished.  No distress.  HEENT: head atraumatic, normocephalic, pupils equal and reactive to light, ears TMs clear, neck supple, throat within normal limits Cardiovascular: Normal rate, regular rhythm and normal heart sounds.  No murmur heard. No BLE edema. Pulmonary/Chest: Effort normal and breath sounds normal. No respiratory distress. Abdominal: Soft.  There is no tenderness. Psychiatric: Patient has a normal mood and affect. behavior is normal. Judgment and thought content normal.   Results for orders placed or performed in visit on 12/31/20  CBC with Differential/Platelet  Result Value Ref Range   WBC 6.3 3.8 - 10.8 Thousand/uL   RBC 4.95 4.20 - 5.80 Million/uL   Hemoglobin 15.6 13.2 - 17.1 g/dL   HCT 46.3 38.5 - 50.0 %   MCV 93.5 80.0 - 100.0 fL   MCH 31.5 27.0 - 33.0 pg   MCHC 33.7 32.0 - 36.0 g/dL   RDW 12.4 11.0 - 15.0 %   Platelets 198 140 - 400 Thousand/uL   MPV 10.9 7.5 - 12.5 fL   Neutro Abs 2,829 1,500 - 7,800 cells/uL   Lymphs Abs 2,652 850 - 3,900 cells/uL   Absolute Monocytes 554 200 - 950 cells/uL   Eosinophils Absolute 233 15 - 500 cells/uL  Basophils Absolute 32 0 - 200 cells/uL   Neutrophils Relative % 44.9 %   Total Lymphocyte 42.1 %   Monocytes Relative 8.8 %   Eosinophils Relative 3.7 %   Basophils Relative 0.5 %  Lipid panel  Result Value Ref Range   Cholesterol 117 <200 mg/dL   HDL 60 > OR = 40 mg/dL   Triglycerides 109 <150 mg/dL   LDL Cholesterol (Calc) 38 mg/dL (calc)   Total CHOL/HDL Ratio 2.0 <5.0 (calc)   Non-HDL Cholesterol (Calc) 57 <130 mg/dL (calc)  COMPLETE METABOLIC PANEL WITH GFR  Result Value Ref Range   Glucose, Bld 90 65 - 99 mg/dL   BUN 14 7 - 25 mg/dL   Creat 0.85 0.70 - 1.28 mg/dL   eGFR 90 > OR = 60 mL/min/1.19m   BUN/Creatinine Ratio NOT APPLICABLE 6 - 22 (calc)   Sodium 139 135 - 146 mmol/L   Potassium 3.7 3.5 - 5.3 mmol/L   Chloride 103 98 - 110 mmol/L   CO2 25 20 - 32 mmol/L   Calcium 9.3 8.6  - 10.3 mg/dL   Total Protein 7.3 6.1 - 8.1 g/dL   Albumin 4.7 3.6 - 5.1 g/dL   Globulin 2.6 1.9 - 3.7 g/dL (calc)   AG Ratio 1.8 1.0 - 2.5 (calc)   Total Bilirubin 0.7 0.2 - 1.2 mg/dL   Alkaline phosphatase (APISO) 60 35 - 144 U/L   AST 15 10 - 35 U/L   ALT 21 9 - 46 U/L  Hemoglobin A1c  Result Value Ref Range   Hgb A1c MFr Bld 7.3 (H) <5.7 % of total Hgb   Mean Plasma Glucose 163 mg/dL   eAG (mmol/L) 9.0 mmol/L      Assessment & Plan:   1. Acute non-recurrent pansinusitis -push fluids -OTC treatments zyrtec, flonase and mucinex -tylenol for pain -warm compresses to face - amoxicillin-clavulanate (AUGMENTIN) 875-125 MG tablet; Take 1 tablet by mouth 2 (two) times daily for 10 days.  Dispense: 20 tablet; Refill: 0   Follow up plan: Return if symptoms worsen or fail to improve.

## 2021-02-25 ENCOUNTER — Other Ambulatory Visit: Payer: Self-pay | Admitting: Emergency Medicine

## 2021-02-25 DIAGNOSIS — Z794 Long term (current) use of insulin: Secondary | ICD-10-CM

## 2021-02-25 MED ORDER — ACCU-CHEK GUIDE VI STRP: ORAL_STRIP | 3 refills | Status: DC

## 2021-02-25 NOTE — Telephone Encounter (Signed)
Order sent.

## 2021-03-10 ENCOUNTER — Other Ambulatory Visit: Payer: Self-pay | Admitting: Emergency Medicine

## 2021-03-10 DIAGNOSIS — E1140 Type 2 diabetes mellitus with diabetic neuropathy, unspecified: Secondary | ICD-10-CM

## 2021-03-11 DIAGNOSIS — E1140 Type 2 diabetes mellitus with diabetic neuropathy, unspecified: Secondary | ICD-10-CM

## 2021-03-11 DIAGNOSIS — I10 Essential (primary) hypertension: Secondary | ICD-10-CM

## 2021-03-13 ENCOUNTER — Ambulatory Visit: Payer: HMO

## 2021-03-25 ENCOUNTER — Ambulatory Visit (INDEPENDENT_AMBULATORY_CARE_PROVIDER_SITE_OTHER): Payer: HMO

## 2021-03-25 VITALS — BP 124/72 | HR 86 | Temp 98.4°F | Resp 16 | Ht 65.0 in | Wt 161.0 lb

## 2021-03-25 DIAGNOSIS — Z0000 Encounter for general adult medical examination without abnormal findings: Secondary | ICD-10-CM | POA: Diagnosis not present

## 2021-03-25 DIAGNOSIS — E1140 Type 2 diabetes mellitus with diabetic neuropathy, unspecified: Secondary | ICD-10-CM

## 2021-03-25 MED ORDER — ONETOUCH ULTRA VI STRP: ORAL_STRIP | 12 refills | Status: DC

## 2021-03-25 NOTE — Patient Instructions (Signed)
[name] , Thank you for taking time to come for your Medicare Wellness Visit. I appreciate your ongoing commitment to your health goals. Please review the following plan we discussed and let me know if I can assist you in the future.   Screening recommendations/referrals: Colonoscopy: done 03/25/18. Repeat 03/2023 Recommended yearly ophthalmology/optometry visit for glaucoma screening and checkup Recommended yearly dental visit for hygiene and checkup  Vaccinations: Influenza vaccine: done 10/02/20 Pneumococcal vaccine: done 06/17/18 Tdap vaccine: done 02/13/21 Shingles vaccine: done 05/24/19 & 07/26/19   Covid-19: done 02/24/19, 03/27/19, 11/14/19, 04/15/20 & 10/08/20  Conditions/risks identified: Keep up the great work!   Next appointment: Follow up in one year for your annual wellness visit.   Preventive Care 24 Years and Older, Male Preventive care refers to lifestyle choices and visits with your health care provider that can promote health and wellness. What does preventive care include? A yearly physical exam. This is also called an annual well check. Dental exams once or twice a year. Routine eye exams. Ask your health care provider how often you should have your eyes checked. Personal lifestyle choices, including: Daily care of your teeth and gums. Regular physical activity. Eating a healthy diet. Avoiding tobacco and drug use. Limiting alcohol use. Practicing safe sex. Taking low doses of aspirin every day. Taking vitamin and mineral supplements as recommended by your health care provider. What happens during an annual well check? The services and screenings done by your health care provider during your annual well check will depend on your age, overall health, lifestyle risk factors, and family history of disease. Counseling  Your health care provider may ask you questions about your: Alcohol use. Tobacco use. Drug use. Emotional well-being. Home and relationship  well-being. Sexual activity. Eating habits. History of falls. Memory and ability to understand (cognition). Work and work Astronomer. Screening  You may have the following tests or measurements: Height, weight, and BMI. Blood pressure. Lipid and cholesterol levels. These may be checked every 5 years, or more frequently if you are over 89 years old. Skin check. Lung cancer screening. You may have this screening every year starting at age 77 if you have a 30-pack-year history of smoking and currently smoke or have quit within the past 15 years. Fecal occult blood test (FOBT) of the stool. You may have this test every year starting at age 107. Flexible sigmoidoscopy or colonoscopy. You may have a sigmoidoscopy every 5 years or a colonoscopy every 10 years starting at age 73. Prostate cancer screening. Recommendations will vary depending on your family history and other risks. Hepatitis C blood test. Hepatitis B blood test. Sexually transmitted disease (STD) testing. Diabetes screening. This is done by checking your blood sugar (glucose) after you have not eaten for a while (fasting). You may have this done every 1-3 years. Abdominal aortic aneurysm (AAA) screening. You may need this if you are a current or former smoker. Osteoporosis. You may be screened starting at age 48 if you are at high risk. Talk with your health care provider about your test results, treatment options, and if necessary, the need for more tests. Vaccines  Your health care provider may recommend certain vaccines, such as: Influenza vaccine. This is recommended every year. Tetanus, diphtheria, and acellular pertussis (Tdap, Td) vaccine. You may need a Td booster every 10 years. Zoster vaccine. You may need this after age 69. Pneumococcal 13-valent conjugate (PCV13) vaccine. One dose is recommended after age 40. Pneumococcal polysaccharide (PPSV23) vaccine. One dose is  recommended after age 35. Talk to your health care  provider about which screenings and vaccines you need and how often you need them. This information is not intended to replace advice given to you by your health care provider. Make sure you discuss any questions you have with your health care provider. Document Released: 01/25/2015 Document Revised: 09/18/2015 Document Reviewed: 10/30/2014 Elsevier Interactive Patient Education  2017 [employer].  Fall Prevention in the Home Falls can cause injuries. They can happen to people of all ages. There are many things you can do to make your home safe and to help prevent falls. What can I do on the outside of my home? Regularly fix the edges of walkways and driveways and fix any cracks. Remove anything that might make you trip as you walk through a door, such as a raised step or threshold. Trim any bushes or trees on the path to your home. Use bright outdoor lighting. Clear any walking paths of anything that might make someone trip, such as rocks or tools. Regularly check to see if handrails are loose or broken. Make sure that both sides of any steps have handrails. Any raised decks and porches should have guardrails on the edges. Have any leaves, snow, or ice cleared regularly. Use sand or salt on walking paths during winter. Clean up any spills in your garage right away. This includes oil or grease spills. What can I do in the bathroom? Use night lights. Install grab bars by the toilet and in the tub and shower. Do not use towel bars as grab bars. Use non-skid mats or decals in the tub or shower. If you need to sit down in the shower, use a plastic, non-slip stool. Keep the floor dry. Clean up any water that spills on the floor as soon as it happens. Remove soap buildup in the tub or shower regularly. Attach bath mats securely with double-sided non-slip rug tape. Do not have throw rugs and other things on the floor that can make you trip. What can I do in the bedroom? Use night lights. Make  sure that you have a light by your bed that is easy to reach. Do not use any sheets or blankets that are too big for your bed. They should not hang down onto the floor. Have a firm chair that has side arms. You can use this for support while you get dressed. Do not have throw rugs and other things on the floor that can make you trip. What can I do in the kitchen? Clean up any spills right away. Avoid walking on wet floors. Keep items that you use a lot in easy-to-reach places. If you need to reach something above you, use a strong step stool that has a grab bar. Keep electrical cords out of the way. Do not use floor polish or wax that makes floors slippery. If you must use wax, use non-skid floor wax. Do not have throw rugs and other things on the floor that can make you trip. What can I do with my stairs? Do not leave any items on the stairs. Make sure that there are handrails on both sides of the stairs and use them. Fix handrails that are broken or loose. Make sure that handrails are as long as the stairways. Check any carpeting to make sure that it is firmly attached to the stairs. Fix any carpet that is loose or worn. Avoid having throw rugs at the top or bottom of the stairs. If  you do have throw rugs, attach them to the floor with carpet tape. Make sure that you have a light switch at the top of the stairs and the bottom of the stairs. If you do not have them, ask someone to add them for you. What else can I do to help prevent falls? Wear shoes that: Do not have high heels. Have rubber bottoms. Are comfortable and fit you well. Are closed at the toe. Do not wear sandals. If you use a stepladder: Make sure that it is fully opened. Do not climb a closed stepladder. Make sure that both sides of the stepladder are locked into place. Ask someone to hold it for you, if possible. Clearly mark and make sure that you can see: Any grab bars or handrails. First and last steps. Where the  edge of each step is. Use tools that help you move around (mobility aids) if they are needed. These include: Canes. Walkers. Scooters. Crutches. Turn on the lights when you go into a dark area. Replace any light bulbs as soon as they burn out. Set up your furniture so you have a clear path. Avoid moving your furniture around. If any of your floors are uneven, fix them. If there are any pets around you, be aware of where they are. Review your medicines with your doctor. Some medicines can make you feel dizzy. This can increase your chance of falling. Ask your doctor what other things that you can do to help prevent falls. This information is not intended to replace advice given to you by your health care provider. Make sure you discuss any questions you have with your health care provider. Document Released: 10/25/2008 Document Revised: 06/06/2015 Document Reviewed: 02/02/2014 Elsevier Interactive Patient Education  2017 [employer].
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
## 2021-03-26 ENCOUNTER — Ambulatory Visit (INDEPENDENT_AMBULATORY_CARE_PROVIDER_SITE_OTHER): Payer: HMO

## 2021-03-26 DIAGNOSIS — I10 Essential (primary) hypertension: Secondary | ICD-10-CM

## 2021-03-26 DIAGNOSIS — E1140 Type 2 diabetes mellitus with diabetic neuropathy, unspecified: Secondary | ICD-10-CM

## 2021-03-26 DIAGNOSIS — E1169 Type 2 diabetes mellitus with other specified complication: Secondary | ICD-10-CM

## 2021-03-26 NOTE — Chronic Care Management (AMB) (Signed)
?Chronic Care Management  ? ?CCM RN Visit Note ? ?03/26/2021 ?Name: Aaron Calhoun. MRN: [id number] DOB: 05-18-1945 ? ?Subjective: ?Aaron Calhoun. is a 76 y.o. year old male who is a primary care patient of Bo Merino, FNP. The care management team was consulted for assistance with disease management and care coordination needs.   ? ?Engaged with patient by telephone for follow up visit in response to provider referral for case management and care coordination services.  ? ?Consent to Services:  ?The patient was given information about Chronic Care Management services, agreed to services, and gave verbal consent prior to initiation of services.  Please see initial visit note for detailed documentation.  ? ?Assessment: Review of patient past medical history, allergies, medications, health status, including review of consultants reports, laboratory and other test data, was performed as part of comprehensive evaluation and provision of chronic care management services.  ? ?SDOH (Social Determinants of Health) assessments and interventions performed:  No ? ?CCM Care Plan ? ?No Known Allergies ? ?Outpatient Encounter Medications as of 03/26/2021  ?Medication Sig  ? Cholecalciferol 25 MCG (1000 UT) tablet Take 2 tablets by mouth daily.  ? empagliflozin (JARDIANCE) 10 MG TABS tablet Take 1 tablet (10 mg total) by mouth daily.  ? finasteride (PROSCAR) 5 MG tablet Take 1 tablet (5 mg total) by mouth daily.  ? gabapentin (NEURONTIN) 300 MG capsule Take 1 capsule (300 mg total) by mouth 3 (three) times daily.  ? glimepiride (AMARYL) 4 MG tablet TAKE 1 TABLET BY MOUTH  DAILY WITH BREAKFAST, HOLD  IF BLOOD SUGAR &lt;100 OR IF  NOT EATING  ? glucose blood (ONETOUCH ULTRA) test strip Use as instructed  ? losartan-hydrochlorothiazide (HYZAAR) 100-25 MG tablet Take 1 tablet by mouth daily. For blood pressure  ? metFORMIN (GLUCOPHAGE) 1000 MG tablet TAKE 1 TABLET BY MOUTH  TWICE DAILY WITH A MEAL  ? rosuvastatin (CRESTOR) 5 MG  tablet Take 1 tablet (5 mg total) by mouth at bedtime.  ? sildenafil (VIAGRA) 50 MG tablet Take 1-2 tablets (50-100 mg total) by mouth daily as needed for erectile dysfunction (take 30 min before sexual activity).  ? tamsulosin (FLOMAX) 0.4 MG CAPS capsule Take 1 capsule (0.4 mg total) by mouth daily.  ? ?No facility-administered encounter medications on file as of 03/26/2021.  ? ? ?Patient Active Problem List  ? Diagnosis Date Noted  ? Chronic abdominal pain 02/24/2021  ? Erectile dysfunction 02/24/2021  ? Cataracts, bilateral 02/24/2021  ? Low serum vitamin D 02/24/2021  ? Other personal history presenting hazards to health 02/23/2020  ? Vertigo 08/16/2019  ? Penile irritation 03/24/2016  ? Lumbar spinal stenosis 04/22/2015  ? Type 2 diabetes mellitus with diabetic neuropathy, without long-term current use of insulin ([place]) 11/05/2014  ? Uncontrolled type 2 diabetes mellitus 11/05/2014  ? Hypertension goal BP (blood pressure) < 140/90 11/05/2014  ? Hyperlipidemia 11/05/2014  ? Benign prostate hyperplasia 11/05/2014  ? ? ?Patient Care Plan: RN Care Management Plan of Care  ?  ? ?Problem Identified: Hypertension, Hyperlipidemia, Type 2 Diabetes   ?  ? ?Long-Range Goal: Disease Progression Prevented or Minimized Completed 03/26/2021  ?Start Date: 11/27/2020  ?Expected End Date: 02/25/2021  ?Priority: High  ?Note:   ?Current Barriers:  ?Chronic Disease Management support and education needs related to HTN, HLD, and DMII ? ?RNCM Clinical Goal(s):  ?Patient will demonstrate Improved adherence to prescribed treatment plan for HTN, HLD, and DMII through collaboration with the provider and care management team. ? ?  Interventions: ?1:1 collaboration with primary care provider regarding development and update of comprehensive plan of care as evidenced by provider attestation and co-signature ?Inter-disciplinary care team collaboration (see longitudinal plan of care) ?Evaluation of current treatment plan related to  self  management and patient's adherence to plan as established by provider ? ?Hypertension Interventions: (Goal Met) ?BP Readings from Last 3 Encounters:  ?03/25/21 124/72  ?02/24/21 138/84  ?12/31/20 130/70  ?  ?Last practice recorded BP readings:  ?  ?Most recent eGFR/CrCl: No results found for: EGFR  No components found for: CRCL ? ?Reviewed plan for hypertension management. Remains compliant with medications, diet and BP monitoring. ?Reviewed BP readings. Reports home reading have remained stable with systolic readings in the 132'G to 130's. Diastolic readings have remained in the 70's and 80's.  ?Reviewed s/sx of heart attack, stroke and worsening symptoms that require immediate medical attention. ? ? ? ?Hyperlipidemia Interventions:(Goal Met) ?Reviewed plan for hyperlipidemia management. Remains compliant with medications and diet. No concerns regarding statin tolerance.  ?Reviewed pending appointments. He will be evaluated in the clinic on 03/31/21. Will complete labs if prescribed. ?Remains motivated to maintain goals and improve overall health. ? ? ? ?Diabetes Interventions: (Goal Met) ?Lab Results  ?Component Value Date  ? HGBA1C 7.2 02/13/2021  ?  ?Lab Results  ?Component Value Date  ? HGBA1C 7.7 (A) 09/24/2020  ? ?Lab Results  ?Component Value Date  ? HGBA1C 7.3 (H) 12/31/2020  ? ?Reviewed medications and plan for diabetes management. Remains compliant with medications and blood glucose monitoring. ?Reviewed blood glucose readings. Reports readings over the past week have ranged from the 83-147. No hypoglycemic or hyperglycemic episodes.  ?Reports doing well with nutritional intake. ?Reviewed recent labs. A1C has improved to goal. Declined need for additional diabetic resources.  ? ? ? ?Patient Goals/Self-Care Activities: ?Patient will self administer medications as prescribed ?Patient will attend all scheduled provider appointments ?Patient will call pharmacy for medication refills ?Patient will continue to  perform ADL's independently ?Patient will continue to perform IADL's independently ?Patient will call provider office for new concerns or questions ? ? ?  ?  ? ? ?PLAN ?Mr. Rog has met his nurse care management goals. He will follow up for a clinic/provider visit on 03/31/21.  ?Agreed to call or contact the clinic if he requires additional assistance. The care management team will gladly assist. ? ? ?Jamesina Gaugh,RN ?[place]/[facility] Care Management ?[facility] ?[phone] ? ? ? ? ? ? ? ?

## 2021-03-26 NOTE — Patient Instructions (Addendum)
Thank you for allowing the Chronic Care Management team to participate in your care. It was great speaking with you today! ?Congratulations on meeting your nurse care management goals! ?Please do not hesitate to call if you require additional assistance. ? ? ?Yishai Rehfeld,RN ?[place]/[facility] Care Management ?[facility] ?[phone] ?

## 2021-03-27 ENCOUNTER — Encounter: Payer: Self-pay | Admitting: Nurse Practitioner

## 2021-03-27 ENCOUNTER — Telehealth: Payer: Self-pay

## 2021-03-27 NOTE — Telephone Encounter (Signed)
Copied from [place] [id number]. Topic: General - Call Back - No Documentation >> Mar 27, 2021  3:20 PM Erick Blinks wrote: Reason for CRM:   Jarrett Soho from [facility] called to report that she needs more information for instructions for use of the one touch ultra test strips. Needs frequency detailed, 90 day supply specifically   Best contact: [phone]

## 2021-03-28 NOTE — Telephone Encounter (Signed)
[facility] notified to test BS once a day. Will give 4 refills ?

## 2021-03-28 NOTE — Telephone Encounter (Signed)
[phone] Hope from [facility] called to follow up on request, please advise  ?

## 2021-03-31 ENCOUNTER — Ambulatory Visit (INDEPENDENT_AMBULATORY_CARE_PROVIDER_SITE_OTHER): Payer: HMO | Admitting: Nurse Practitioner

## 2021-03-31 ENCOUNTER — Encounter: Payer: Self-pay | Admitting: Nurse Practitioner

## 2021-03-31 VITALS — BP 136/70 | HR 65 | Temp 98.4°F | Resp 16 | Ht 65.0 in | Wt 164.2 lb

## 2021-03-31 DIAGNOSIS — R351 Nocturia: Secondary | ICD-10-CM | POA: Diagnosis not present

## 2021-03-31 DIAGNOSIS — I10 Essential (primary) hypertension: Secondary | ICD-10-CM | POA: Diagnosis not present

## 2021-03-31 DIAGNOSIS — G8928 Other chronic postprocedural pain: Secondary | ICD-10-CM

## 2021-03-31 DIAGNOSIS — E1169 Type 2 diabetes mellitus with other specified complication: Secondary | ICD-10-CM | POA: Diagnosis not present

## 2021-03-31 DIAGNOSIS — E785 Hyperlipidemia, unspecified: Secondary | ICD-10-CM

## 2021-03-31 DIAGNOSIS — E1140 Type 2 diabetes mellitus with diabetic neuropathy, unspecified: Secondary | ICD-10-CM | POA: Diagnosis not present

## 2021-03-31 DIAGNOSIS — M1990 Unspecified osteoarthritis, unspecified site: Secondary | ICD-10-CM | POA: Diagnosis not present

## 2021-03-31 DIAGNOSIS — N401 Enlarged prostate with lower urinary tract symptoms: Secondary | ICD-10-CM | POA: Diagnosis not present

## 2021-03-31 DIAGNOSIS — N529 Male erectile dysfunction, unspecified: Secondary | ICD-10-CM

## 2021-03-31 MED ORDER — GLIMEPIRIDE 4 MG PO TABS: ORAL_TABLET | ORAL | 1 refills | Status: DC

## 2021-03-31 MED ORDER — EMPAGLIFLOZIN 10 MG PO TABS: 10.0000 mg | ORAL_TABLET | Freq: Every day | ORAL | 1 refills | Status: DC

## 2021-03-31 MED ORDER — SILDENAFIL CITRATE 50 MG PO TABS: 50.0000 mg | ORAL_TABLET | Freq: Every day | ORAL | 5 refills | Status: DC | PRN

## 2021-03-31 MED ORDER — TAMSULOSIN HCL 0.4 MG PO CAPS: 0.4000 mg | ORAL_CAPSULE | Freq: Every day | ORAL | 3 refills | Status: DC

## 2021-03-31 MED ORDER — METFORMIN HCL 1000 MG PO TABS: ORAL_TABLET | ORAL | 1 refills | Status: DC

## 2021-03-31 MED ORDER — ROSUVASTATIN CALCIUM 5 MG PO TABS: 5.0000 mg | ORAL_TABLET | Freq: Every day | ORAL | 1 refills | Status: DC

## 2021-03-31 MED ORDER — FINASTERIDE 5 MG PO TABS: 5.0000 mg | ORAL_TABLET | Freq: Every day | ORAL | 3 refills | Status: DC

## 2021-03-31 MED ORDER — LOSARTAN POTASSIUM-HCTZ 100-25 MG PO TABS: 1.0000 | ORAL_TABLET | Freq: Every day | ORAL | 1 refills | Status: DC

## 2021-03-31 MED ORDER — GABAPENTIN 300 MG PO CAPS: 300.0000 mg | ORAL_CAPSULE | Freq: Three times a day (TID) | ORAL | 1 refills | Status: DC

## 2021-03-31 NOTE — Progress Notes (Signed)
? ?BP 136/70   Pulse 65   Temp 98.4 ?F (36.9 ?C)   Resp 16   Ht _0  (1.651 m)   Wt 164 lb 3.2 oz (74.5 kg)   SpO2 96%   BMI 27.32 kg/m?   ? ?Subjective:  ? ? Patient ID: Aaron Calhoun., male    DOB: Apr 23, 1945, 76 y.o.   MRN: [id number] ? ?HPI: ?Aaron Calhoun. is a 76 y.o. male, here with wife ? ?Chief Complaint  ?Patient presents with  ? Follow-up  ?  3 month follow up  ? ?Diabetes: He is currently taking jardiance 10 mg daily, metformin 1000 mg BID, and amaryl 4 mg daily.  He denies any polyuria, polydipsia or polyphagia.  He says his blood sugars have been running around 137-147.  His last A1C was 7.2 on 02/13/2021. He is due for his foot exam.  He is up to date on his eye exam. He is on a statin and an ARB. Will continue with current treatment.  ?  ?Hyperlipidemia: He is currently taking rosuvastatin 5 mg at bedtime.  He denies any myalgia.  His last LDL was 38 on 12/31/2020. Will continue with current treatment. ?  ?HTN: His blood pressure today is 140/64. He says his blood pressure has been running around 121-123/70s.  He is taking losartan-hydrochlorothiazide 100-25 mg daily.  He denies any chest pain, shortness of breath, headaches or blurred vision.  Will continue with current treatment.  ? ?BPH/ED: he uses viagra for erectile dysfunction and needs a refill. He also has BPH and takes flomax and proscar.  Refills sent. Last PSA was 0.31. ? ?Arthritis and chronic pain: He is currently taking gabapentin 300 mg TID. He says that helps with his pain. Will send in refills.  ? ?Relevant past medical, surgical, family and social history reviewed and updated as indicated. Interim medical history since our last visit reviewed. ?Allergies and medications reviewed and updated. ? ?Review of Systems ? ?Constitutional: Negative for fever or weight change.  ?Respiratory: Negative for cough and shortness of breath.   ?Cardiovascular: Negative for chest pain or palpitations.  ?Gastrointestinal: Negative for  abdominal pain, no bowel changes.  ?Musculoskeletal: Negative for gait problem or joint swelling.  ?Skin: Negative for rash.  ?Neurological: Negative for dizziness or headache.  ?No other specific complaints in a complete review of systems (except as listed in HPI above).  ? ?   ?Objective:  ?  ?BP 136/70   Pulse 65   Temp 98.4 ?F (36.9 ?C)   Resp 16   Ht _1  (1.651 m)   Wt 164 lb 3.2 oz (74.5 kg)   SpO2 96%   BMI 27.32 kg/m?   ?Wt Readings from Last 3 Encounters:  ?03/31/21 164 lb 3.2 oz (74.5 kg)  ?03/25/21 161 lb (73 kg)  ?02/24/21 164 lb (74.4 kg)  ?  ?Physical Exam ? ?Constitutional: Patient appears well-developed and well-nourished.  No distress.  ?HEENT: head atraumatic, normocephalic, pupils equal and reactive to light, neck supple ?Cardiovascular: Normal rate, regular rhythm and normal heart sounds.  No murmur heard. No BLE edema. ?Pulmonary/Chest: Effort normal and breath sounds normal. No respiratory distress. ?Abdominal: Soft.  There is no tenderness. ?Psychiatric: Patient has a normal mood and affect. behavior is normal. Judgment and thought content normal.  ? ?Results for orders placed or performed in visit on 03/25/21  ?VITAMIN D 25 Hydroxy (Vit-D Deficiency, Fractures)  ?Result Value Ref Range  ? Vit D, 25-Hydroxy 24.1   ?  Basic metabolic panel  ?Result Value Ref Range  ? Glucose 115   ? BUN 14 4 - 21  ? CO2 27 (A) 13 - 22  ? Creatinine 0.9 0.6 - 1.3  ? Potassium 3.6 3.5 - 5.1 mEq/L  ? Sodium 138 137 - 147  ? Chloride 104 99 - 108  ?Comprehensive metabolic panel  ?Result Value Ref Range  ? eGFR 89   ? Calcium 9.5 8.7 - 10.7  ?Hemoglobin A1c  ?Result Value Ref Range  ? Hemoglobin A1C 7.2   ?PSA  ?Result Value Ref Range  ? PSA 0.31   ? ?   ?Assessment & Plan:  ? ?1. Type 2 diabetes mellitus with diabetic neuropathy, without long-term current use of insulin ([place]) ?-continue with current treatment ?- HM Diabetes Foot Exam ?- metFORMIN (GLUCOPHAGE) 1000 MG tablet; TAKE 1 TABLET BY MOUTH  TWICE  DAILY WITH A MEAL  Dispense: 180 tablet; Refill: 1 ?- glimepiride (AMARYL) 4 MG tablet; TAKE 1 TABLET BY MOUTH  DAILY WITH BREAKFAST, HOLD  IF BLOOD SUGAR &lt;100 OR IF  NOT EATING  Dispense: 90 tablet; Refill: 1 ?- empagliflozin (JARDIANCE) 10 MG TABS tablet; Take 1 tablet (10 mg total) by mouth daily.  Dispense: 90 tablet; Refill: 1 ? ?2. Hyperlipidemia associated with type 2 diabetes mellitus ([place]) ?-continue with current treatment ?- rosuvastatin (CRESTOR) 5 MG tablet; Take 1 tablet (5 mg total) by mouth at bedtime.  Dispense: 90 tablet; Refill: 1 ? ?3. Hypertension goal BP (blood pressure) < 140/90 ?-continue with current treatment ?- losartan-hydrochlorothiazide (HYZAAR) 100-25 MG tablet; Take 1 tablet by mouth daily. For blood pressure  Dispense: 90 tablet; Refill: 1 ? ?4. Benign prostatic hyperplasia with nocturia ?-continue with current treatment ?- tamsulosin (FLOMAX) 0.4 MG CAPS capsule; Take 1 capsule (0.4 mg total) by mouth daily.  Dispense: 90 capsule; Refill: 3 ?- finasteride (PROSCAR) 5 MG tablet; Take 1 tablet (5 mg total) by mouth daily.  Dispense: 90 tablet; Refill: 3 ? ?5. Erectile dysfunction, unspecified erectile dysfunction type ?-continue with current treatment ?- sildenafil (VIAGRA) 50 MG tablet; Take 1-2 tablets (50-100 mg total) by mouth daily as needed for erectile dysfunction (take 30 min before sexual activity).  Dispense: 20 tablet; Refill: 5 ? ?6. Chronic post-operative pain ?-continue with current treatment ?- gabapentin (NEURONTIN) 300 MG capsule; Take 1 capsule (300 mg total) by mouth 3 (three) times daily.  Dispense: 270 capsule; Refill: 1 ? ?7. Arthritis ?-continue with current treatment ?- gabapentin (NEURONTIN) 300 MG capsule; Take 1 capsule (300 mg total) by mouth 3 (three) times daily.  Dispense: 270 capsule; Refill: 1  ? ?Follow up plan: ?Return in about 3 months (around 07/01/2021) for follow up. ? ? ? ? ? ?

## 2021-04-11 DIAGNOSIS — E785 Hyperlipidemia, unspecified: Secondary | ICD-10-CM

## 2021-04-11 DIAGNOSIS — E1169 Type 2 diabetes mellitus with other specified complication: Secondary | ICD-10-CM

## 2021-04-11 DIAGNOSIS — I10 Essential (primary) hypertension: Secondary | ICD-10-CM

## 2021-04-11 DIAGNOSIS — E1140 Type 2 diabetes mellitus with diabetic neuropathy, unspecified: Secondary | ICD-10-CM

## 2021-04-16 ENCOUNTER — Telehealth: Payer: Self-pay | Admitting: Gastroenterology

## 2021-04-16 NOTE — Telephone Encounter (Signed)
Patient states that he missed a call from [place] and is requesting a call back. ?

## 2021-05-28 ENCOUNTER — Telehealth: Payer: HMO

## 2021-06-01 DIAGNOSIS — E1165 Type 2 diabetes mellitus with hyperglycemia: Secondary | ICD-10-CM | POA: Diagnosis not present

## 2021-06-01 DIAGNOSIS — I10 Essential (primary) hypertension: Secondary | ICD-10-CM | POA: Diagnosis not present

## 2021-06-01 DIAGNOSIS — N400 Enlarged prostate without lower urinary tract symptoms: Secondary | ICD-10-CM | POA: Diagnosis not present

## 2021-06-01 DIAGNOSIS — E785 Hyperlipidemia, unspecified: Secondary | ICD-10-CM | POA: Diagnosis not present

## 2021-06-01 DIAGNOSIS — E1169 Type 2 diabetes mellitus with other specified complication: Secondary | ICD-10-CM | POA: Diagnosis not present

## 2021-06-01 DIAGNOSIS — E663 Overweight: Secondary | ICD-10-CM | POA: Diagnosis not present

## 2021-06-01 DIAGNOSIS — E559 Vitamin D deficiency, unspecified: Secondary | ICD-10-CM | POA: Diagnosis not present

## 2021-06-01 DIAGNOSIS — E1142 Type 2 diabetes mellitus with diabetic polyneuropathy: Secondary | ICD-10-CM | POA: Diagnosis not present

## 2021-07-02 ENCOUNTER — Ambulatory Visit (INDEPENDENT_AMBULATORY_CARE_PROVIDER_SITE_OTHER): Payer: HMO | Admitting: Family Medicine

## 2021-07-02 ENCOUNTER — Other Ambulatory Visit: Payer: Self-pay

## 2021-07-02 ENCOUNTER — Encounter: Payer: Self-pay | Admitting: Family Medicine

## 2021-07-02 VITALS — BP 130/76 | HR 85 | Temp 98.2°F | Resp 16 | Ht 65.0 in | Wt 163.3 lb

## 2021-07-02 DIAGNOSIS — N529 Male erectile dysfunction, unspecified: Secondary | ICD-10-CM

## 2021-07-02 DIAGNOSIS — R351 Nocturia: Secondary | ICD-10-CM

## 2021-07-02 DIAGNOSIS — N401 Enlarged prostate with lower urinary tract symptoms: Secondary | ICD-10-CM

## 2021-07-02 DIAGNOSIS — E1169 Type 2 diabetes mellitus with other specified complication: Secondary | ICD-10-CM | POA: Diagnosis not present

## 2021-07-02 DIAGNOSIS — E1140 Type 2 diabetes mellitus with diabetic neuropathy, unspecified: Secondary | ICD-10-CM | POA: Diagnosis not present

## 2021-07-02 DIAGNOSIS — E785 Hyperlipidemia, unspecified: Secondary | ICD-10-CM

## 2021-07-02 DIAGNOSIS — I10 Essential (primary) hypertension: Secondary | ICD-10-CM

## 2021-07-02 DIAGNOSIS — G8928 Other chronic postprocedural pain: Secondary | ICD-10-CM

## 2021-07-02 MED ORDER — ONETOUCH ULTRA VI STRP: ORAL_STRIP | 12 refills | Status: DC

## 2021-07-02 MED ORDER — FINASTERIDE 5 MG PO TABS: 5.0000 mg | ORAL_TABLET | Freq: Every day | ORAL | 3 refills | Status: DC

## 2021-07-02 MED ORDER — EMPAGLIFLOZIN 10 MG PO TABS: 10.0000 mg | ORAL_TABLET | Freq: Every day | ORAL | 3 refills | Status: DC

## 2021-07-02 MED ORDER — SILDENAFIL CITRATE 50 MG PO TABS: 50.0000 mg | ORAL_TABLET | Freq: Every day | ORAL | 5 refills | Status: DC | PRN

## 2021-07-02 MED ORDER — METFORMIN HCL 1000 MG PO TABS: ORAL_TABLET | ORAL | 3 refills | Status: DC

## 2021-07-02 NOTE — Assessment & Plan Note (Addendum)
Doing well on current regimen, no changes made today. Obtaining labs today.

## 2021-07-02 NOTE — Assessment & Plan Note (Signed)
Doing well on current regimen, no changes made today. 

## 2021-07-02 NOTE — Assessment & Plan Note (Signed)
Recheck A1c and adjust as indicated. Refilled meds today. Obtain urine microalbumin. UTD with eye, foot exams and PNA vaccines.

## 2021-07-02 NOTE — Progress Notes (Signed)
   SUBJECTIVE:   CHIEF COMPLAINT / HPI:   Hypertension: - Medications: losartan-HCTZ - Compliance: good - Checking BP at home: 120s SBP - Denies any SOB, CP, vision changes, LE edema, medication SEs, or symptoms of hypotension  Diabetes, Type 2 - Last A1c 7.2 02/2021 - Medications: jardiance, glimepiride, metformin - Compliance: good - Checking BG at home: yes, 98-140s - Eye exam: UTD - Foot exam: UTD - Microalbumin: due - Statin: yes - PNA vaccine: UTD - Denies symptoms of hypoglycemia, polyuria, polydipsia, numbness extremities, foot ulcers/trauma  HLD - medications: crestor - compliance: good - medication SEs: none  Benign Prostatic Hypertrophy - Medications: finasteride, flomax - Symptoms: nocturia two times a night  - Denies: incomplete emptying, straining, and weak stream gross hematuria - no personal history and no family history of prostate cancer  ED - on viagra prn, 1 pill as needed. Good results.   OBJECTIVE:   BP 130/76   Pulse 85   Temp 98.2 F (36.8 C) (Oral)   Resp 16   Ht '5\' 5"'$  (1.651 m)   Wt 163 lb 4.8 oz (74.1 kg)   SpO2 97%   BMI 27.17 kg/m   Gen: well appearing, in NAD Card: RRR Lungs: CTAB Ext: WWP, no edema  ASSESSMENT/PLAN:   Hypertension goal BP (blood pressure) < 140/90 Doing well on current regimen, no changes made today. Obtaining labs today.  Hyperlipidemia associated with type 2 diabetes mellitus ([facility]) Tolerant of statin, continue. Last LDL at goal.   Type 2 diabetes mellitus without complications ([place]) Recheck A1c and adjust as indicated. Refilled meds today. Obtain urine microalbumin. UTD with eye, foot exams and PNA vaccines.   Benign prostate hyperplasia Doing well on current regimen, no changes made today.  Chronic post-operative pain Managed well with gabapentin, continue.   Erectile dysfunction Doing well on current regimen, no changes made today.   F/u 6 months.  Myles Gip, DO

## 2021-07-02 NOTE — Patient Instructions (Signed)
It was great to see you!  Our plans for today:  - No changes to your medications.  - We are checking some labs today, we will release these results to your MyChart.  Take care and seek immediate care sooner if you develop any concerns.   Dr. Keirah Konitzer  

## 2021-07-02 NOTE — Assessment & Plan Note (Signed)
Tolerant of statin, continue. Last LDL at goal.  

## 2021-07-02 NOTE — Assessment & Plan Note (Signed)
Managed well with gabapentin, continue.

## 2021-07-03 LAB — MICROALBUMIN / CREATININE URINE RATIO
Creatinine, Urine: 49 mg/dL (ref 20–320)
Microalb Creat Ratio: 10 mcg/mg creat (ref <30)
Microalb, Ur: 0.5 mg/dL

## 2021-07-03 LAB — HEMOGLOBIN A1C
Hgb A1c MFr Bld: 7.5 % of total Hgb — ABNORMAL HIGH (ref <5.7)
Mean Plasma Glucose: 169 mg/dL
eAG (mmol/L): 9.3 mmol/L

## 2021-07-03 LAB — BASIC METABOLIC PANEL
BUN: 15 mg/dL (ref 7–25)
CO2: 23 mmol/L (ref 20–32)
Calcium: 9.4 mg/dL (ref 8.6–10.3)
Chloride: 104 mmol/L (ref 98–110)
Creat: 0.85 mg/dL (ref 0.70–1.28)
Glucose, Bld: 158 mg/dL — ABNORMAL HIGH (ref 65–99)
Potassium: 3.5 mmol/L (ref 3.5–5.3)
Sodium: 139 mmol/L (ref 135–146)

## 2021-07-08 ENCOUNTER — Telehealth: Payer: Self-pay | Admitting: Nurse Practitioner

## 2021-07-10 NOTE — Telephone Encounter (Signed)
Pt called to report that he checks his sugar twice a day

## 2021-07-11 NOTE — Telephone Encounter (Signed)
Called pharmacy and clarified

## 2021-10-03 ENCOUNTER — Telehealth: Payer: Self-pay | Admitting: Nurse Practitioner

## 2021-10-03 ENCOUNTER — Other Ambulatory Visit: Payer: Self-pay | Admitting: Emergency Medicine

## 2021-10-03 DIAGNOSIS — R351 Nocturia: Secondary | ICD-10-CM

## 2021-10-03 DIAGNOSIS — I10 Essential (primary) hypertension: Secondary | ICD-10-CM

## 2021-10-03 MED ORDER — LOSARTAN POTASSIUM-HCTZ 100-25 MG PO TABS: 1.0000 | ORAL_TABLET | Freq: Every day | ORAL | 1 refills | Status: DC

## 2021-10-03 MED ORDER — FINASTERIDE 5 MG PO TABS: 5.0000 mg | ORAL_TABLET | Freq: Every day | ORAL | 3 refills | Status: DC

## 2021-10-03 NOTE — Telephone Encounter (Signed)
Orders pend to [facility] to refill

## 2021-10-03 NOTE — Telephone Encounter (Signed)
Patient came in requesting a refill of the following medications to be sent to [facility]   Losartan- Hydrochlorothiazide 100-'25mg'$   Finasteride tablets '5mg'$ 

## 2021-11-13 ENCOUNTER — Other Ambulatory Visit: Payer: Self-pay | Admitting: Emergency Medicine

## 2021-11-13 ENCOUNTER — Telehealth: Payer: Self-pay | Admitting: Nurse Practitioner

## 2021-11-13 DIAGNOSIS — E1169 Type 2 diabetes mellitus with other specified complication: Secondary | ICD-10-CM

## 2021-11-13 MED ORDER — ROSUVASTATIN CALCIUM 5 MG PO TABS: 5.0000 mg | ORAL_TABLET | Freq: Every day | ORAL | 1 refills | Status: DC

## 2021-11-13 NOTE — Telephone Encounter (Signed)
Requesting refill on rosuvastatin calcium '5mg'$ . Please send to [facility]. He has enough to last until next week.

## 2021-11-13 NOTE — Telephone Encounter (Signed)
Order sent to [facility] for signing off

## 2021-12-31 NOTE — Progress Notes (Unsigned)
There were no vitals taken for this visit.   Subjective:    Patient ID: Aaron Simmonds., male    DOB: 1946-01-04, 76 y.o.   MRN: [id number]  HPI: Aaron Deems. is a 76 y.o. male, here with wife  No chief complaint on file.  Diabetes: His last A1C was 7.5 on 07/02/2021. He is currently taking jardiance 10 mg daily, metformin 1000 mg BID and amaryl 4 mg daily.  He denies any polyphagia, polydipsia or polyuria.  He says his blood sugar runs around ***.  He is up to date on his urine microablumin, foot and eye exam.    Hyperlipidemia: His last LDL was 38 on 12/31/2020.  He currently takes rosuvastatin 5 mg at bedtime.  He denies any myalgia. Will get labs.    HTN: Patient's blood pressure today is ***.  He says his blood pressure runs around ***. He currently takes losartan-hydrochlorothiazide 100-25 mg daily.  He denies any chest pain, shortness of breath, headaches or blurred vision.   BPH/ED: He is currently on flomax and proscar for BPH and uses viagra for ED.  His last PSA was 0.31 on 02/13/2021.  Arthritis and chronic pain: he takes gabapentin 300 mg TID for his pain. He says that this does help with his chronic pain.   Relevant past medical, surgical, family and social history reviewed and updated as indicated. Interim medical history since our last visit reviewed. Allergies and medications reviewed and updated.  Review of Systems  Constitutional: Negative for fever or weight change.  Respiratory: Negative for cough and shortness of breath.   Cardiovascular: Negative for chest pain or palpitations.  Gastrointestinal: Negative for abdominal pain, no bowel changes.  Musculoskeletal: Negative for gait problem or joint swelling.  Skin: Negative for rash.  Neurological: Negative for dizziness or headache.  No other specific complaints in a complete review of systems (except as listed in HPI above).      Objective:    There were no vitals taken for this visit.  Wt Readings  from Last 3 Encounters:  07/02/21 163 lb 4.8 oz (74.1 kg)  03/31/21 164 lb 3.2 oz (74.5 kg)  03/25/21 161 lb (73 kg)    Physical Exam  Constitutional: Patient appears well-developed and well-nourished.  No distress.  HEENT: head atraumatic, normocephalic, pupils equal and reactive to light, neck supple Cardiovascular: Normal rate, regular rhythm and normal heart sounds.  No murmur heard. No BLE edema. Pulmonary/Chest: Effort normal and breath sounds normal. No respiratory distress. Abdominal: Soft.  There is no tenderness. Psychiatric: Patient has a normal mood and affect. behavior is normal. Judgment and thought content normal.   Results for orders placed or performed in visit on 70/35/00  Basic Metabolic Panel (BMET)  Result Value Ref Range   Glucose, Bld 158 (H) 65 - 99 mg/dL   BUN 15 7 - 25 mg/dL   Creat 0.85 0.70 - 1.28 mg/dL   BUN/Creatinine Ratio NOT APPLICABLE 6 - 22 (calc)   Sodium 139 135 - 146 mmol/L   Potassium 3.5 3.5 - 5.3 mmol/L   Chloride 104 98 - 110 mmol/L   CO2 23 20 - 32 mmol/L   Calcium 9.4 8.6 - 10.3 mg/dL  Microalbumin / creatinine urine ratio  Result Value Ref Range   Creatinine, Urine 49 20 - 320 mg/dL   Microalb, Ur 0.5 mg/dL   Microalb Creat Ratio 10 <30 mcg/mg creat  Hemoglobin A1c  Result Value Ref Range   Hgb A1c MFr  Bld 7.5 (H) <5.7 % of total Hgb   Mean Plasma Glucose 169 mg/dL   eAG (mmol/L) 9.3 mmol/L      Assessment & Plan:   Problem List Items Addressed This Visit   None    Follow up plan: No follow-ups on file.

## 2022-01-01 ENCOUNTER — Ambulatory Visit (INDEPENDENT_AMBULATORY_CARE_PROVIDER_SITE_OTHER): Payer: HMO | Admitting: Nurse Practitioner

## 2022-01-01 ENCOUNTER — Encounter: Payer: Self-pay | Admitting: Nurse Practitioner

## 2022-01-01 ENCOUNTER — Other Ambulatory Visit: Payer: Self-pay

## 2022-01-01 VITALS — BP 128/78 | HR 84 | Temp 98.4°F | Resp 16 | Ht 65.0 in | Wt 167.7 lb

## 2022-01-01 DIAGNOSIS — N529 Male erectile dysfunction, unspecified: Secondary | ICD-10-CM

## 2022-01-01 DIAGNOSIS — E1140 Type 2 diabetes mellitus with diabetic neuropathy, unspecified: Secondary | ICD-10-CM | POA: Diagnosis not present

## 2022-01-01 DIAGNOSIS — G8928 Other chronic postprocedural pain: Secondary | ICD-10-CM

## 2022-01-01 DIAGNOSIS — R351 Nocturia: Secondary | ICD-10-CM

## 2022-01-01 DIAGNOSIS — N401 Enlarged prostate with lower urinary tract symptoms: Secondary | ICD-10-CM | POA: Diagnosis not present

## 2022-01-01 DIAGNOSIS — M1990 Unspecified osteoarthritis, unspecified site: Secondary | ICD-10-CM

## 2022-01-01 DIAGNOSIS — E1169 Type 2 diabetes mellitus with other specified complication: Secondary | ICD-10-CM

## 2022-01-01 DIAGNOSIS — E785 Hyperlipidemia, unspecified: Secondary | ICD-10-CM

## 2022-01-01 DIAGNOSIS — I10 Essential (primary) hypertension: Secondary | ICD-10-CM

## 2022-01-01 MED ORDER — ROSUVASTATIN CALCIUM 5 MG PO TABS: 5.0000 mg | ORAL_TABLET | Freq: Every day | ORAL | 3 refills | Status: DC

## 2022-01-01 MED ORDER — LOSARTAN POTASSIUM-HCTZ 100-25 MG PO TABS: 1.0000 | ORAL_TABLET | Freq: Every day | ORAL | 3 refills | Status: DC

## 2022-01-01 MED ORDER — FINASTERIDE 5 MG PO TABS: 5.0000 mg | ORAL_TABLET | Freq: Every day | ORAL | 3 refills | Status: DC

## 2022-01-01 MED ORDER — METFORMIN HCL 1000 MG PO TABS: ORAL_TABLET | ORAL | 3 refills | Status: DC

## 2022-01-01 MED ORDER — GLIMEPIRIDE 4 MG PO TABS: ORAL_TABLET | ORAL | 1 refills | Status: DC

## 2022-01-01 MED ORDER — GABAPENTIN 300 MG PO CAPS: 300.0000 mg | ORAL_CAPSULE | Freq: Three times a day (TID) | ORAL | 3 refills | Status: DC

## 2022-01-01 NOTE — Assessment & Plan Note (Signed)
Continue taking losartan-hydrochlorothiazide 100-25 mg daily.

## 2022-01-01 NOTE — Assessment & Plan Note (Signed)
No change 

## 2022-01-01 NOTE — Assessment & Plan Note (Signed)
Continue taking flomax and proscar

## 2022-01-01 NOTE — Assessment & Plan Note (Signed)
Continue taking rosuvastatin 5 mg at bedtime

## 2022-01-01 NOTE — Assessment & Plan Note (Signed)
Continue taking  metformin 1000 mg BID and amaryl 4 mg daily.

## 2022-01-01 NOTE — Assessment & Plan Note (Signed)
No changes made

## 2022-01-01 NOTE — Assessment & Plan Note (Signed)
Continue taking gabapentin 300 mg three times a day

## 2022-01-02 LAB — CBC WITH DIFFERENTIAL/PLATELET
Absolute Monocytes: 645 cells/uL (ref 200–950)
Basophils Absolute: 19 cells/uL (ref 0–200)
Basophils Relative: 0.3 %
Eosinophils Absolute: 242 cells/uL (ref 15–500)
Eosinophils Relative: 3.9 %
HCT: 41.4 % (ref 38.5–50.0)
Hemoglobin: 14.3 g/dL (ref 13.2–17.1)
Lymphs Abs: 2697 cells/uL (ref 850–3900)
MCH: 31.6 pg (ref 27.0–33.0)
MCHC: 34.5 g/dL (ref 32.0–36.0)
MCV: 91.6 fL (ref 80.0–100.0)
MPV: 11.1 fL (ref 7.5–12.5)
Monocytes Relative: 10.4 %
Neutro Abs: 2598 cells/uL (ref 1500–7800)
Neutrophils Relative %: 41.9 %
Platelets: 206 10*3/uL (ref 140–400)
RBC: 4.52 10*6/uL (ref 4.20–5.80)
RDW: 12.3 % (ref 11.0–15.0)
Total Lymphocyte: 43.5 %
WBC: 6.2 10*3/uL (ref 3.8–10.8)

## 2022-01-02 LAB — COMPLETE METABOLIC PANEL WITH GFR
AG Ratio: 1.7 (calc) (ref 1.0–2.5)
ALT: 27 U/L (ref 9–46)
AST: 16 U/L (ref 10–35)
Albumin: 4.7 g/dL (ref 3.6–5.1)
Alkaline phosphatase (APISO): 64 U/L (ref 35–144)
BUN: 13 mg/dL (ref 7–25)
CO2: 27 mmol/L (ref 20–32)
Calcium: 9.5 mg/dL (ref 8.6–10.3)
Chloride: 103 mmol/L (ref 98–110)
Creat: 0.77 mg/dL (ref 0.70–1.28)
Globulin: 2.7 g/dL (calc) (ref 1.9–3.7)
Glucose, Bld: 166 mg/dL — ABNORMAL HIGH (ref 65–99)
Potassium: 3.8 mmol/L (ref 3.5–5.3)
Sodium: 141 mmol/L (ref 135–146)
Total Bilirubin: 0.6 mg/dL (ref 0.2–1.2)
Total Protein: 7.4 g/dL (ref 6.1–8.1)
eGFR: 92 mL/min/{1.73_m2} (ref >=60)

## 2022-01-02 LAB — LIPID PANEL
Cholesterol: 114 mg/dL (ref <200)
HDL: 60 mg/dL (ref >=40)
LDL Cholesterol (Calc): 36 mg/dL (calc)
Non-HDL Cholesterol (Calc): 54 mg/dL (calc) (ref <130)
Total CHOL/HDL Ratio: 1.9 (calc) (ref <5.0)
Triglycerides: 98 mg/dL (ref <150)

## 2022-01-02 LAB — HEMOGLOBIN A1C
Hgb A1c MFr Bld: 8.1 % of total Hgb — ABNORMAL HIGH (ref <5.7)
Mean Plasma Glucose: 186 mg/dL
eAG (mmol/L): 10.3 mmol/L

## 2022-01-14 ENCOUNTER — Other Ambulatory Visit: Payer: Self-pay | Admitting: Emergency Medicine

## 2022-01-14 DIAGNOSIS — E1140 Type 2 diabetes mellitus with diabetic neuropathy, unspecified: Secondary | ICD-10-CM

## 2022-01-14 MED ORDER — EMPAGLIFLOZIN 10 MG PO TABS: 10.0000 mg | ORAL_TABLET | Freq: Every day | ORAL | 3 refills | Status: DC

## 2022-03-27 ENCOUNTER — Other Ambulatory Visit: Payer: Self-pay

## 2022-03-27 ENCOUNTER — Ambulatory Visit (INDEPENDENT_AMBULATORY_CARE_PROVIDER_SITE_OTHER): Payer: PPO

## 2022-03-27 VITALS — BP 122/74 | Ht 66.0 in | Wt 154.3 lb

## 2022-03-27 DIAGNOSIS — G8928 Other chronic postprocedural pain: Secondary | ICD-10-CM

## 2022-03-27 DIAGNOSIS — Z0000 Encounter for general adult medical examination without abnormal findings: Secondary | ICD-10-CM | POA: Diagnosis not present

## 2022-03-27 DIAGNOSIS — I10 Essential (primary) hypertension: Secondary | ICD-10-CM

## 2022-03-27 DIAGNOSIS — E1140 Type 2 diabetes mellitus with diabetic neuropathy, unspecified: Secondary | ICD-10-CM

## 2022-03-27 DIAGNOSIS — N401 Enlarged prostate with lower urinary tract symptoms: Secondary | ICD-10-CM

## 2022-03-27 DIAGNOSIS — E1169 Type 2 diabetes mellitus with other specified complication: Secondary | ICD-10-CM

## 2022-03-27 DIAGNOSIS — M1990 Unspecified osteoarthritis, unspecified site: Secondary | ICD-10-CM

## 2022-03-27 MED ORDER — EMPAGLIFLOZIN 10 MG PO TABS: 10.0000 mg | ORAL_TABLET | Freq: Every day | ORAL | 3 refills | Status: DC

## 2022-03-27 MED ORDER — FINASTERIDE 5 MG PO TABS: 5.0000 mg | ORAL_TABLET | Freq: Every day | ORAL | 3 refills | Status: DC

## 2022-03-27 MED ORDER — GABAPENTIN 300 MG PO CAPS: 300.0000 mg | ORAL_CAPSULE | Freq: Three times a day (TID) | ORAL | 3 refills | Status: DC

## 2022-03-27 MED ORDER — GLIMEPIRIDE 4 MG PO TABS: ORAL_TABLET | ORAL | 1 refills | Status: DC

## 2022-03-27 MED ORDER — LOSARTAN POTASSIUM-HCTZ 100-25 MG PO TABS: 1.0000 | ORAL_TABLET | Freq: Every day | ORAL | 3 refills | Status: DC

## 2022-03-27 MED ORDER — ROSUVASTATIN CALCIUM 5 MG PO TABS: 5.0000 mg | ORAL_TABLET | Freq: Every day | ORAL | 3 refills | Status: DC

## 2022-03-27 MED ORDER — METFORMIN HCL 1000 MG PO TABS: ORAL_TABLET | ORAL | 3 refills | Status: DC
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
## 2022-03-27 NOTE — Patient Instructions (Signed)
[name] , Thank you for taking time to come for your Medicare Wellness Visit. I appreciate your ongoing commitment to your health goals. Please review the following plan we discussed and let me know if I can assist you in the future.   These are the goals we discussed:  Goals      DIET - INCREASE WATER INTAKE     Recommend to drink at least 6-8 8oz glasses of water per day.     Monitor and Manage My Blood Sugar-Diabetes Type 2     Timeframe:  Long-Range Goal Priority:  High Start Date:   04/04/2020                           Expected End Date:   10/05/2021                    Follow Up within 30 days   - check blood sugar at prescribed times - enter blood sugar readings and medication or insulin into daily log - take the blood sugar log to all doctor visits    Why is this important?   Checking your blood sugar at home helps to keep it from getting very high or very low.  Writing the results in a diary or log helps the doctor know how to care for you.  Your blood sugar log should have the time, date and the results.  Also, write down the amount of insulin or other medicine that you take.  Other information, like what you ate, exercise done and how you were feeling, will also be helpful.     Notes:      Peak Blood Glucose<180     Patient would like to lower his fasting blood sugars with diet and exercise         This is a list of the screening recommended for you and due dates:  Health Maintenance  Topic Date Due   Eye exam for diabetics  02/16/2022   Complete foot exam   04/01/2022   Yearly kidney health urinalysis for diabetes  07/03/2022   Hemoglobin A1C  07/03/2022   Yearly kidney function blood test for diabetes  01/02/2023   Colon Cancer Screening  03/25/2023   Medicare Annual Wellness Visit  03/27/2023   DTaP/Tdap/Td vaccine (3 - Td or Tdap) 02/14/2031   Pneumonia Vaccine  Completed   Flu Shot  Completed   COVID-19 Vaccine  Completed   Hepatitis C Screening:  USPSTF Recommendation to screen - Ages 15-79 yo.  Completed   Zoster (Shingles) Vaccine  Completed   HPV Vaccine  Aged Out    Advanced directives: yes  Conditions/risks identified: none  Next appointment: Follow up in one year for your annual wellness visit. 04/02/2023 @10 :45 am in person  Preventive Care 65 Years and Older, Male  Preventive care refers to lifestyle choices and visits with your health care provider that can promote health and wellness. What does preventive care include? A yearly physical exam. This is also called an annual well check. Dental exams once or twice a year. Routine eye exams. Ask your health care provider how often you should have your eyes checked. Personal lifestyle choices, including: Daily care of your teeth and gums. Regular physical activity. Eating a healthy diet. Avoiding tobacco and drug use. Limiting alcohol use. Practicing safe sex. Taking low doses of aspirin every day. Taking vitamin and mineral supplements as recommended by your health care  provider. What happens during an annual well check? The services and screenings done by your health care provider during your annual well check will depend on your age, overall health, lifestyle risk factors, and family history of disease. Counseling  Your health care provider may ask you questions about your: Alcohol use. Tobacco use. Drug use. Emotional well-being. Home and relationship well-being. Sexual activity. Eating habits. History of falls. Memory and ability to understand (cognition). Work and work Statistician. Screening  You may have the following tests or measurements: Height, weight, and BMI. Blood pressure. Lipid and cholesterol levels. These may be checked every 5 years, or more frequently if you are over 62 years old. Skin check. Lung cancer screening. You may have this screening every year starting at age 61 if you have a 30-pack-year history of smoking and currently smoke  or have quit within the past 15 years. Fecal occult blood test (FOBT) of the stool. You may have this test every year starting at age 54. Flexible sigmoidoscopy or colonoscopy. You may have a sigmoidoscopy every 5 years or a colonoscopy every 10 years starting at age 53. Prostate cancer screening. Recommendations will vary depending on your family history and other risks. Hepatitis C blood test. Hepatitis B blood test. Sexually transmitted disease (STD) testing. Diabetes screening. This is done by checking your blood sugar (glucose) after you have not eaten for a while (fasting). You may have this done every 1-3 years. Abdominal aortic aneurysm (AAA) screening. You may need this if you are a current or former smoker. Osteoporosis. You may be screened starting at age 64 if you are at high risk. Talk with your health care provider about your test results, treatment options, and if necessary, the need for more tests. Vaccines  Your health care provider may recommend certain vaccines, such as: Influenza vaccine. This is recommended every year. Tetanus, diphtheria, and acellular pertussis (Tdap, Td) vaccine. You may need a Td booster every 10 years. Zoster vaccine. You may need this after age 52. Pneumococcal 13-valent conjugate (PCV13) vaccine. One dose is recommended after age 20. Pneumococcal polysaccharide (PPSV23) vaccine. One dose is recommended after age 57. Talk to your health care provider about which screenings and vaccines you need and how often you need them. This information is not intended to replace advice given to you by your health care provider. Make sure you discuss any questions you have with your health care provider. Document Released: 01/25/2015 Document Revised: 09/18/2015 Document Reviewed: 10/30/2014 Elsevier Interactive Patient Education  2017 [employer] Prevention in the Home Falls can cause injuries. They can happen to people of all ages. There are many  things you can do to make your home safe and to help prevent falls. What can I do on the outside of my home? Regularly fix the edges of walkways and driveways and fix any cracks. Remove anything that might make you trip as you walk through a door, such as a raised step or threshold. Trim any bushes or trees on the path to your home. Use bright outdoor lighting. Clear any walking paths of anything that might make someone trip, such as rocks or tools. Regularly check to see if handrails are loose or broken. Make sure that both sides of any steps have handrails. Any raised decks and porches should have guardrails on the edges. Have any leaves, snow, or ice cleared regularly. Use sand or salt on walking paths during winter. Clean up any spills in your garage right away. This  includes oil or grease spills. What can I do in the bathroom? Use night lights. Install grab bars by the toilet and in the tub and shower. Do not use towel bars as grab bars. Use non-skid mats or decals in the tub or shower. If you need to sit down in the shower, use a plastic, non-slip stool. Keep the floor dry. Clean up any water that spills on the floor as soon as it happens. Remove soap buildup in the tub or shower regularly. Attach bath mats securely with double-sided non-slip rug tape. Do not have throw rugs and other things on the floor that can make you trip. What can I do in the bedroom? Use night lights. Make sure that you have a light by your bed that is easy to reach. Do not use any sheets or blankets that are too big for your bed. They should not hang down onto the floor. Have a firm chair that has side arms. You can use this for support while you get dressed. Do not have throw rugs and other things on the floor that can make you trip. What can I do in the kitchen? Clean up any spills right away. Avoid walking on wet floors. Keep items that you use a lot in easy-to-reach places. If you need to reach  something above you, use a strong step stool that has a grab bar. Keep electrical cords out of the way. Do not use floor polish or wax that makes floors slippery. If you must use wax, use non-skid floor wax. Do not have throw rugs and other things on the floor that can make you trip. What can I do with my stairs? Do not leave any items on the stairs. Make sure that there are handrails on both sides of the stairs and use them. Fix handrails that are broken or loose. Make sure that handrails are as long as the stairways. Check any carpeting to make sure that it is firmly attached to the stairs. Fix any carpet that is loose or worn. Avoid having throw rugs at the top or bottom of the stairs. If you do have throw rugs, attach them to the floor with carpet tape. Make sure that you have a light switch at the top of the stairs and the bottom of the stairs. If you do not have them, ask someone to add them for you. What else can I do to help prevent falls? Wear shoes that: Do not have high heels. Have rubber bottoms. Are comfortable and fit you well. Are closed at the toe. Do not wear sandals. If you use a stepladder: Make sure that it is fully opened. Do not climb a closed stepladder. Make sure that both sides of the stepladder are locked into place. Ask someone to hold it for you, if possible. Clearly mark and make sure that you can see: Any grab bars or handrails. First and last steps. Where the edge of each step is. Use tools that help you move around (mobility aids) if they are needed. These include: Canes. Walkers. Scooters. Crutches. Turn on the lights when you go into a dark area. Replace any light bulbs as soon as they burn out. Set up your furniture so you have a clear path. Avoid moving your furniture around. If any of your floors are uneven, fix them. If there are any pets around you, be aware of where they are. Review your medicines with your doctor. Some medicines can make you  feel dizzy.  This can increase your chance of falling. Ask your doctor what other things that you can do to help prevent falls. This information is not intended to replace advice given to you by your health care provider. Make sure you discuss any questions you have with your health care provider. Document Released: 10/25/2008 Document Revised: 06/06/2015 Document Reviewed: 02/02/2014 Elsevier Interactive Patient Education  2017 [employer].

## 2022-03-31 ENCOUNTER — Ambulatory Visit: Payer: HMO

## 2022-04-07 ENCOUNTER — Encounter: Payer: Self-pay | Admitting: Physician Assistant

## 2022-04-07 ENCOUNTER — Other Ambulatory Visit: Payer: Self-pay

## 2022-04-07 ENCOUNTER — Ambulatory Visit (INDEPENDENT_AMBULATORY_CARE_PROVIDER_SITE_OTHER): Payer: PPO | Admitting: Physician Assistant

## 2022-04-07 VITALS — BP 128/76 | HR 86 | Temp 97.6°F | Resp 16 | Ht 65.0 in | Wt 157.3 lb

## 2022-04-07 DIAGNOSIS — M545 Low back pain, unspecified: Secondary | ICD-10-CM | POA: Diagnosis not present

## 2022-04-07 MED ORDER — MELOXICAM 15 MG PO TABS: 15.0000 mg | ORAL_TABLET | Freq: Every day | ORAL | 0 refills | Status: DC

## 2022-04-07 NOTE — Patient Instructions (Signed)
Based on your symptoms and physical exam I believe the following is the cause of your concern today Back pain likely secondary to a strain of your back muscles  I recommend the following at this time to help relieve that discomfort:  Rest Warm compresses to the area (20 minutes on, minimum of 30 minutes off) You can alternate Tylenol and Ibuprofen for pain management but Ibuprofen is typically preferred to reduce inflammation.  I have sent in a  medication for you to take instead of Ibuprofen or other NSAIDs. This is called Meloxicam and you would only take it once per day. You can take Tylenol as needed for further pain management  Gentle stretches and exercises that I have included in your paperwork Try to reduce excess strain to the area and rest as much as possible  Wear supportive shoes and, if you must lift anything, use proper lifting techniques that spare your back.   If these measures do not lead to improvement in your symptoms over the next 2-4 weeks please let us know

## 2022-04-07 NOTE — Progress Notes (Signed)
Acute Office Visit   Patient: Aaron Calhoun.   DOB: 1945-05-11   77 y.o. Male  MRN: [id number] Visit Date: 04/07/2022  Today's healthcare provider: Dani Gobble Stacey Maura, PA-C  Introduced myself to the patient as a Journalist, newspaper and provided education on APPs in clinical practice.    Chief Complaint  Patient presents with   Back Pain    For 4 days   Subjective    Back Pain Pertinent negatives include no dysuria, headaches, numbness or weakness.   HPI     Back Pain    Additional comments: For 4 days      Last edited by Hollie Salk, [place] on 04/07/2022 10:02 AM.       Back pain  Onset: sudden  Duration: about 4 days  Location: lower back pain  Radiation: some mild tingling in right leg intermittently  Pain level and character: 5/10 right now, gets up to about 7/10 - aching pain  Interventions: Tylenol provided some relief  Aggravating: nothing  Alleviating: Tylenol  He reports he was sitting in a hard bench/ chair during a sporting event He was also taking care of his wife up until 2 weeks ago when she passed away- he was turning her and providing care tasks   He reports for the past few months he has had increased urinary frequency and urgency  States it feels like he can't hold it very well     Medications: Outpatient Medications Prior to Visit  Medication Sig   Cholecalciferol 25 MCG (1000 UT) tablet Take 2 tablets by mouth daily.   empagliflozin (JARDIANCE) 10 MG TABS tablet Take 1 tablet (10 mg total) by mouth daily.   finasteride (PROSCAR) 5 MG tablet Take 1 tablet (5 mg total) by mouth daily.   gabapentin (NEURONTIN) 300 MG capsule Take 1 capsule (300 mg total) by mouth 3 (three) times daily.   glimepiride (AMARYL) 4 MG tablet TAKE 1 TABLET BY MOUTH  DAILY WITH BREAKFAST, HOLD  IF BLOOD SUGAR &lt;100 OR IF  NOT EATING   losartan-hydrochlorothiazide (HYZAAR) 100-25 MG tablet Take 1 tablet by mouth daily. For blood pressure   metFORMIN (GLUCOPHAGE) 1000 MG  tablet TAKE 1 TABLET BY MOUTH  TWICE DAILY WITH A MEAL   rosuvastatin (CRESTOR) 5 MG tablet Take 1 tablet (5 mg total) by mouth at bedtime.   tamsulosin (FLOMAX) 0.4 MG CAPS capsule Take 1 capsule (0.4 mg total) by mouth daily.   glucose blood (ONETOUCH ULTRA) test strip Use as instructed (Patient not taking: Reported on 04/07/2022)   sildenafil (VIAGRA) 50 MG tablet Take 1-2 tablets (50-100 mg total) by mouth daily as needed for erectile dysfunction (take 30 min before sexual activity). (Patient not taking: Reported on 03/27/2022)   No facility-administered medications prior to visit.    Review of Systems  Genitourinary:  Positive for frequency and urgency. Negative for difficulty urinating and dysuria.  Musculoskeletal:  Positive for back pain.  Neurological:  Negative for dizziness, tremors, weakness, numbness and headaches.       Objective    BP 128/76   Pulse 86   Temp 97.6 F (36.4 C) (Oral)   Resp 16   Ht 5\' 5"  (1.651 m)   Wt 157 lb 4.8 oz (71.4 kg)   SpO2 98%   BMI 26.18 kg/m    Physical Exam Vitals reviewed.  Constitutional:      General: He is awake.     Appearance: Normal appearance.  He is well-developed and well-groomed.  HENT:     Head: Normocephalic and atraumatic.  Musculoskeletal:     Cervical back: Normal. No swelling, edema, rigidity, torticollis, tenderness or bony tenderness. Normal range of motion.     Thoracic back: No swelling, edema, signs of trauma, tenderness or bony tenderness. Normal range of motion.     Lumbar back: Spasms and tenderness present. No swelling, edema or signs of trauma. Normal range of motion. Negative right straight leg raise test and negative left straight leg raise test.     Comments: Thoracic ROM intact- lateral flexion and rotation intact but lateral flexion to left is a bit painful  Mild pain with extension. Flexion is normal - ROM Intact with both movements   Neurological:     General: No focal deficit present.     Mental  Status: He is alert and oriented to person, place, and time. Mental status is at baseline.  Psychiatric:        Mood and Affect: Mood normal.        Behavior: Behavior normal. Behavior is cooperative.        Thought Content: Thought content normal.        Judgment: Judgment normal.       No results found for any visits on 04/07/22.  Assessment & Plan      No follow-ups on file.      Problem List Items Addressed This Visit   None Visit Diagnoses     Acute bilateral low back pain without sciatica    -  Primary Acute, new concern Reports low back pain that is minimally responsive to Tylenol Suspect muscular etiology at this time but he does have a hx of lumbar spinal stenosis that may be contributing Will start with conservative management, he is not on blood thinners and HTN appears well controlled. GFR >60 on most recent labs Will send in script for Meloxciam 15 mg PO QD to assist with pain and inflammation May use Tylenol PRN for further pain Recommend warm compresses,gentle stretching and massage as tolerated to assist with discomfort Reviewed rest and avoiding heavy lifting or strenuous activity for several weeks If pain is not improving in approx 4 weeks, may need imaging and referral to PT  Follow up as needed for persistent or progressing symptoms    Relevant Medications   meloxicam (MOBIC) 15 MG tablet        No follow-ups on file.   I, Rylan Kaufmann E Marnee Sherrard, PA-C, have reviewed all documentation for this visit. The documentation on 04/07/22 for the exam, diagnosis, procedures, and orders are all accurate and complete.   Talitha Givens, MHS, PA-C [facility]

## 2022-07-03 ENCOUNTER — Ambulatory Visit: Payer: HMO | Admitting: Nurse Practitioner

## 2022-07-05 NOTE — Progress Notes (Unsigned)
There were no vitals taken for this visit.   Subjective:    Patient ID: Aaron Kill., male    DOB: April 03, 1945, 77 y.o.   MRN: [id number]  HPI: Aaron Keelan. is a 77 y.o. male, here with wife  No chief complaint on file.  Diabetes, Type 2:  -Last A1c 8.1 -Medications: jardiance 10 mg daily, metformin 1000 mg BID, amaryl 4 mg daily -Patient is compliant with the above medications and reports no side effects. *** -Checking BG at home: *** -Fasting home BG: *** -Post-prandial home BG: *** -Highest home BG since last visit: *** -Lowest home BG since last visit: *** -Diet: continue to work on eating a balanced diet -Exercise: increase physical activity as tolerated -Eye exam: due -Foot exam: due -Microalbumin: due -Statin: yes -PNA vaccine: yes -Denies symptoms of hypoglycemia, polyuria, polydipsia, numbness extremities, foot ulcers/trauma. ***    HLD:  -Medications: rosuvastatin 5 mg at bedtime -Patient is compliant with above medications and reports no side effects. *** -Last lipid panel:   Lipid Panel     Component Value Date/Time   CHOL 114 01/01/2022 0843   CHOL 140 07/19/2015 1016   TRIG 98 01/01/2022 0843   HDL 60 01/01/2022 0843   HDL 70 07/19/2015 1016   CHOLHDL 1.9 01/01/2022 0843   VLDL 13 05/05/2016 1435   LDLCALC 36 01/01/2022 0843   LABVLDL 22 07/19/2015 1016    Hypertension:  -Medications: losartan-hydrochlorothiazide 100-25 mg daily -Patient is compliant with above medications and reports no side effects. -Checking BP at home (average): *** -Highest BP at home: *** -Lowest BP at home: *** -Denies any SOB, CP, vision changes, LE edema or symptoms of hypotension -Diet: recommend DASH diet -Exercise: increase physical activity as tolerated   BPH/ED: He is currently on flomax and proscar for BPH and uses viagra for ED.  His last PSA was 0.31 on 02/13/2021. No changes.   Arthritis and chronic pain: he takes gabapentin 300 mg TID for his pain.  He says that this does help with his chronic pain. No changes  Relevant past medical, surgical, family and social history reviewed and updated as indicated. Interim medical history since our last visit reviewed. Allergies and medications reviewed and updated.  Review of Systems  Constitutional: Negative for fever or weight change.  Respiratory: Negative for cough and shortness of breath.   Cardiovascular: Negative for chest pain or palpitations.  Gastrointestinal: Negative for abdominal pain, no bowel changes.  Musculoskeletal: Negative for gait problem or joint swelling.  Skin: Negative for rash.  Neurological: Negative for dizziness or headache.  No other specific complaints in a complete review of systems (except as listed in HPI above).      Objective:    There were no vitals taken for this visit.  Wt Readings from Last 3 Encounters:  04/07/22 157 lb 4.8 oz (71.4 kg)  03/27/22 154 lb 4.8 oz (70 kg)  01/01/22 167 lb 11.2 oz (76.1 kg)    Physical Exam  Constitutional: Patient appears well-developed and well-nourished.  No distress.  HEENT: head atraumatic, normocephalic, pupils equal and reactive to light, neck supple Cardiovascular: Normal rate, regular rhythm and normal heart sounds.  No murmur heard. No BLE edema. Pulmonary/Chest: Effort normal and breath sounds normal. No respiratory distress. Abdominal: Soft.  There is no tenderness. Psychiatric: Patient has a normal mood and affect. behavior is normal. Judgment and thought content normal.   Results for orders placed or performed in visit on 01/01/22  CBC  with Differential/Platelet  Result Value Ref Range   WBC 6.2 3.8 - 10.8 Thousand/uL   RBC 4.52 4.20 - 5.80 Million/uL   Hemoglobin 14.3 13.2 - 17.1 g/dL   HCT 32.4 40.1 - 02.7 %   MCV 91.6 80.0 - 100.0 fL   MCH 31.6 27.0 - 33.0 pg   MCHC 34.5 32.0 - 36.0 g/dL   RDW 25.3 66.4 - 40.3 %   Platelets 206 140 - 400 Thousand/uL   MPV 11.1 7.5 - 12.5 fL   Neutro Abs  2,598 1,500 - 7,800 cells/uL   Lymphs Abs 2,697 850 - 3,900 cells/uL   Absolute Monocytes 645 200 - 950 cells/uL   Eosinophils Absolute 242 15 - 500 cells/uL   Basophils Absolute 19 0 - 200 cells/uL   Neutrophils Relative % 41.9 %   Total Lymphocyte 43.5 %   Monocytes Relative 10.4 %   Eosinophils Relative 3.9 %   Basophils Relative 0.3 %  COMPLETE METABOLIC PANEL WITH GFR  Result Value Ref Range   Glucose, Bld 166 (H) 65 - 99 mg/dL   BUN 13 7 - 25 mg/dL   Creat 4.74 2.59 - 5.63 mg/dL   eGFR 92 > OR = 60 OV/FIE/3.32R5   BUN/Creatinine Ratio SEE NOTE: 6 - 22 (calc)   Sodium 141 135 - 146 mmol/L   Potassium 3.8 3.5 - 5.3 mmol/L   Chloride 103 98 - 110 mmol/L   CO2 27 20 - 32 mmol/L   Calcium 9.5 8.6 - 10.3 mg/dL   Total Protein 7.4 6.1 - 8.1 g/dL   Albumin 4.7 3.6 - 5.1 g/dL   Globulin 2.7 1.9 - 3.7 g/dL (calc)   AG Ratio 1.7 1.0 - 2.5 (calc)   Total Bilirubin 0.6 0.2 - 1.2 mg/dL   Alkaline phosphatase (APISO) 64 35 - 144 U/L   AST 16 10 - 35 U/L   ALT 27 9 - 46 U/L  Lipid panel  Result Value Ref Range   Cholesterol 114 <200 mg/dL   HDL 60 > OR = 40 mg/dL   Triglycerides 98 <188 mg/dL   LDL Cholesterol (Calc) 36 mg/dL (calc)   Total CHOL/HDL Ratio 1.9 <5.0 (calc)   Non-HDL Cholesterol (Calc) 54 <416 mg/dL (calc)  Hemoglobin S0Y  Result Value Ref Range   Hgb A1c MFr Bld 8.1 (H) <5.7 % of total Hgb   Mean Plasma Glucose 186 mg/dL   eAG (mmol/L) 30.1 mmol/L      Assessment & Plan:   Problem List Items Addressed This Visit   None    Follow up plan: No follow-ups on file.

## 2022-07-06 ENCOUNTER — Ambulatory Visit
Admission: RE | Admit: 2022-07-06 | Discharge: 2022-07-06 | Disposition: A | Discharge status: Home / Self Care | Payer: PPO | Source: Ambulatory Visit | Attending: Nurse Practitioner | Admitting: Nurse Practitioner

## 2022-07-06 ENCOUNTER — Ambulatory Visit
Admission: RE | Admit: 2022-07-06 | Discharge: 2022-07-06 | Disposition: A | Discharge status: Home / Self Care | Payer: PPO | Attending: Nurse Practitioner | Admitting: Nurse Practitioner

## 2022-07-06 ENCOUNTER — Ambulatory Visit (INDEPENDENT_AMBULATORY_CARE_PROVIDER_SITE_OTHER): Payer: PPO | Admitting: Nurse Practitioner

## 2022-07-06 ENCOUNTER — Encounter: Payer: Self-pay | Admitting: Nurse Practitioner

## 2022-07-06 ENCOUNTER — Other Ambulatory Visit: Payer: Self-pay

## 2022-07-06 VITALS — BP 130/70 | HR 75 | Temp 98.0°F | Resp 16 | Ht 66.0 in | Wt 156.9 lb

## 2022-07-06 DIAGNOSIS — G8928 Other chronic postprocedural pain: Secondary | ICD-10-CM

## 2022-07-06 DIAGNOSIS — M545 Low back pain, unspecified: Secondary | ICD-10-CM | POA: Diagnosis not present

## 2022-07-06 DIAGNOSIS — G8929 Other chronic pain: Secondary | ICD-10-CM

## 2022-07-06 DIAGNOSIS — M47816 Spondylosis without myelopathy or radiculopathy, lumbar region: Secondary | ICD-10-CM | POA: Diagnosis not present

## 2022-07-06 DIAGNOSIS — N401 Enlarged prostate with lower urinary tract symptoms: Secondary | ICD-10-CM | POA: Diagnosis not present

## 2022-07-06 DIAGNOSIS — I10 Essential (primary) hypertension: Secondary | ICD-10-CM

## 2022-07-06 DIAGNOSIS — F4321 Adjustment disorder with depressed mood: Secondary | ICD-10-CM

## 2022-07-06 DIAGNOSIS — E1169 Type 2 diabetes mellitus with other specified complication: Secondary | ICD-10-CM

## 2022-07-06 DIAGNOSIS — E1140 Type 2 diabetes mellitus with diabetic neuropathy, unspecified: Secondary | ICD-10-CM

## 2022-07-06 DIAGNOSIS — M1990 Unspecified osteoarthritis, unspecified site: Secondary | ICD-10-CM

## 2022-07-06 DIAGNOSIS — N529 Male erectile dysfunction, unspecified: Secondary | ICD-10-CM

## 2022-07-06 DIAGNOSIS — Z7984 Long term (current) use of oral hypoglycemic drugs: Secondary | ICD-10-CM | POA: Diagnosis not present

## 2022-07-06 DIAGNOSIS — M5430 Sciatica, unspecified side: Secondary | ICD-10-CM | POA: Diagnosis not present

## 2022-07-06 DIAGNOSIS — R351 Nocturia: Secondary | ICD-10-CM | POA: Diagnosis not present

## 2022-07-06 DIAGNOSIS — E785 Hyperlipidemia, unspecified: Secondary | ICD-10-CM

## 2022-07-06 MED ORDER — MELOXICAM 15 MG PO TABS
15.0000 mg | ORAL_TABLET | Freq: Every day | ORAL | 0 refills | Status: DC
Start: 2022-07-06 — End: 2023-07-06

## 2022-07-06 MED ORDER — LOSARTAN POTASSIUM-HCTZ 100-25 MG PO TABS: 1.0000 | ORAL_TABLET | Freq: Every day | ORAL | 3 refills | Status: DC

## 2022-07-06 MED ORDER — ONETOUCH ULTRA VI STRP: ORAL_STRIP | 12 refills | Status: DC

## 2022-07-06 MED ORDER — EMPAGLIFLOZIN 10 MG PO TABS: 10.0000 mg | ORAL_TABLET | Freq: Every day | ORAL | 3 refills | Status: DC

## 2022-07-06 MED ORDER — METFORMIN HCL 1000 MG PO TABS
ORAL_TABLET | ORAL | 3 refills | Status: DC
Start: 2022-07-06 — End: 2023-07-06

## 2022-07-06 MED ORDER — GABAPENTIN 300 MG PO CAPS: 300.0000 mg | ORAL_CAPSULE | Freq: Three times a day (TID) | ORAL | 3 refills | Status: DC

## 2022-07-06 MED ORDER — FINASTERIDE 5 MG PO TABS: 5.0000 mg | ORAL_TABLET | Freq: Every day | ORAL | 3 refills | Status: DC

## 2022-07-06 MED ORDER — MELOXICAM 15 MG PO TABS: 15.0000 mg | ORAL_TABLET | Freq: Every day | ORAL | 0 refills | Status: DC

## 2022-07-06 MED ORDER — GLIMEPIRIDE 4 MG PO TABS: ORAL_TABLET | ORAL | 3 refills | Status: DC

## 2022-07-06 MED ORDER — ROSUVASTATIN CALCIUM 5 MG PO TABS: 5.0000 mg | ORAL_TABLET | Freq: Every day | ORAL | 3 refills | Status: DC

## 2022-07-06 NOTE — Assessment & Plan Note (Signed)
Currently takes gabapentin 300 mg TID

## 2022-07-06 NOTE — Assessment & Plan Note (Signed)
Was taking viagra, but nolonger taking it

## 2022-07-06 NOTE — Assessment & Plan Note (Signed)
Currently takes proscar 5 mg daily

## 2022-07-06 NOTE — Assessment & Plan Note (Signed)
Continue rosuvastatin 5 mg at bedtime

## 2022-07-06 NOTE — Assessment & Plan Note (Signed)
Currently takes gabaprentin 300 mg TID.

## 2022-07-06 NOTE — Assessment & Plan Note (Signed)
Continue taking losartan-hydrochlorothiazide 100-25 mg daily.  

## 2022-07-06 NOTE — Assessment & Plan Note (Addendum)
Continue taking  jardiance 10 mg daily, metformin 1000 mg BID, amaryl 4 mg daily.  Getting labs today

## 2022-07-07 LAB — MICROALBUMIN / CREATININE URINE RATIO
Creatinine, Urine: 29 mg/dL (ref 20–320)
Microalb Creat Ratio: 7 mg/g creat (ref <30)
Microalb, Ur: 0.2 mg/dL

## 2022-07-07 LAB — CBC WITH DIFFERENTIAL/PLATELET
Absolute Monocytes: 550 cells/uL (ref 200–950)
Basophils Absolute: 32 cells/uL (ref 0–200)
Basophils Relative: 0.5 %
Eosinophils Absolute: 179 cells/uL (ref 15–500)
Eosinophils Relative: 2.8 %
HCT: 44 % (ref 38.5–50.0)
Hemoglobin: 14.8 g/dL (ref 13.2–17.1)
Lymphs Abs: 2605 cells/uL (ref 850–3900)
MCH: 31.8 pg (ref 27.0–33.0)
MCHC: 33.6 g/dL (ref 32.0–36.0)
MCV: 94.4 fL (ref 80.0–100.0)
MPV: 10.8 fL (ref 7.5–12.5)
Monocytes Relative: 8.6 %
Neutro Abs: 3034 cells/uL (ref 1500–7800)
Neutrophils Relative %: 47.4 %
Platelets: 222 10*3/uL (ref 140–400)
RBC: 4.66 10*6/uL (ref 4.20–5.80)
RDW: 12.8 % (ref 11.0–15.0)
Total Lymphocyte: 40.7 %
WBC: 6.4 10*3/uL (ref 3.8–10.8)

## 2022-07-07 LAB — COMPLETE METABOLIC PANEL WITH GFR
AG Ratio: 1.9 (calc) (ref 1.0–2.5)
ALT: 26 U/L (ref 9–46)
AST: 16 U/L (ref 10–35)
Albumin: 4.6 g/dL (ref 3.6–5.1)
Alkaline phosphatase (APISO): 63 U/L (ref 35–144)
BUN: 19 mg/dL (ref 7–25)
CO2: 25 mmol/L (ref 20–32)
Calcium: 9.4 mg/dL (ref 8.6–10.3)
Chloride: 105 mmol/L (ref 98–110)
Creat: 0.77 mg/dL (ref 0.70–1.28)
Globulin: 2.4 g/dL (calc) (ref 1.9–3.7)
Glucose, Bld: 99 mg/dL (ref 65–99)
Potassium: 3.8 mmol/L (ref 3.5–5.3)
Sodium: 140 mmol/L (ref 135–146)
Total Bilirubin: 0.4 mg/dL (ref 0.2–1.2)
Total Protein: 7 g/dL (ref 6.1–8.1)
eGFR: 92 mL/min/{1.73_m2} (ref >=60)

## 2022-07-07 LAB — HEMOGLOBIN A1C
Hgb A1c MFr Bld: 7.2 % of total Hgb — ABNORMAL HIGH (ref <5.7)
Mean Plasma Glucose: 160 mg/dL
eAG (mmol/L): 8.9 mmol/L

## 2022-07-07 LAB — LIPID PANEL
Cholesterol: 119 mg/dL (ref <200)
HDL: 71 mg/dL (ref >=40)
LDL Cholesterol (Calc): 31 mg/dL (calc)
Non-HDL Cholesterol (Calc): 48 mg/dL (calc) (ref <130)
Total CHOL/HDL Ratio: 1.7 (calc) (ref <5.0)
Triglycerides: 88 mg/dL (ref <150)

## 2022-07-07 LAB — PSA: PSA: 0.26 ng/mL (ref <=4.00)

## 2022-07-08 ENCOUNTER — Ambulatory Visit: Payer: Self-pay | Admitting: *Deleted

## 2022-07-08 NOTE — Telephone Encounter (Signed)
Reason for Disposition  [1] Pharmacy calling with prescription question AND [2] triager unable to answer question  Answer Assessment - Initial Assessment Questions 1. NAME of MEDICINE: "What medicine(s) are you calling about?"     Courtney with [employer] (mail order) calling in for clarification.   She can be reached at the prescriber line [phone].   Order #[id number].  Mobic one a day but need a 90  day supply.   Only a 30 day given.    Insurance not cover 30 day supply.    One Touch Ultra  strips  She gave #100.   Need directions for how many needed for each day and a 90 day supply of strips.   2. QUESTION: "What is your question?" (e.g., double dose of medicine, side effect)     See above 3. PRESCRIBER: "Who prescribed the medicine?" Reason: if prescribed by specialist, call should be referred to that group.     Della Goo, FNP 4. SYMPTOMS: "Do you have any symptoms?" If Yes, ask: "What symptoms are you having?"  "How bad are the symptoms (e.g., mild, moderate, severe)     N/A 5. PREGNANCY:  "Is there any chance that you are pregnant?" "When was your last menstrual period?"     N/A  Protocols used: Medication Question Call-A-AH

## 2022-07-08 NOTE — Telephone Encounter (Signed)
  Chief Complaint: [facility] mail order pharmacy called in for clarification on 2 items. Symptoms: N/A Frequency: N/A Pertinent Negatives: Patient denies N/A Disposition: [] ED /[] Urgent Care (no appt availability in office) / [] Appointment(In office/virtual)/ []  [facility]/ [] Home Care/ [] Refused Recommended Disposition /[] [place] [facility]/ [x]  Follow-up with PCP Additional Notes: See triage notes.

## 2022-07-08 NOTE — Telephone Encounter (Signed)
Clarification on how many test strips to give patient and how many times he is testing BS. Pharmacy notified 2 times a day on BS checking. Will dispense #200. Needed clarification on Meloxicam, it was written for #30 and sent to mail order. Pharmacy was informed to disregard that prescription it was sent to patients local pharmacy

## 2022-08-11 ENCOUNTER — Telehealth: Payer: Self-pay | Admitting: Physical Therapy

## 2022-08-11 NOTE — Telephone Encounter (Signed)
Called to notify pt about increased availability. Unable to reach pt and VM is full. Called daughter, Anastasia Pall instead, and passed message along to her about increased availability.

## 2022-08-12 ENCOUNTER — Ambulatory Visit: Payer: PPO | Attending: Nurse Practitioner | Admitting: Physical Therapy

## 2022-08-12 ENCOUNTER — Encounter: Payer: Self-pay | Admitting: Physical Therapy

## 2022-08-12 DIAGNOSIS — M545 Low back pain, unspecified: Secondary | ICD-10-CM | POA: Diagnosis not present

## 2022-08-12 DIAGNOSIS — G8929 Other chronic pain: Secondary | ICD-10-CM | POA: Diagnosis not present

## 2022-08-12 DIAGNOSIS — M5459 Other low back pain: Secondary | ICD-10-CM | POA: Diagnosis not present
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
## 2022-08-17 ENCOUNTER — Ambulatory Visit: Payer: PPO | Attending: Nurse Practitioner | Admitting: Physical Therapy

## 2022-08-17 DIAGNOSIS — M5459 Other low back pain: Secondary | ICD-10-CM | POA: Diagnosis not present
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
## 2022-08-19 ENCOUNTER — Ambulatory Visit: Payer: PPO | Admitting: Physical Therapy

## 2022-08-19 ENCOUNTER — Encounter: Payer: Self-pay | Admitting: Physical Therapy

## 2022-08-19 DIAGNOSIS — M5459 Other low back pain: Secondary | ICD-10-CM | POA: Diagnosis not present
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
## 2022-08-24 ENCOUNTER — Ambulatory Visit: Payer: PPO | Admitting: Physical Therapy

## 2022-08-26 ENCOUNTER — Encounter: Payer: PPO | Admitting: Physical Therapy

## 2022-08-31 ENCOUNTER — Ambulatory Visit: Payer: PPO | Admitting: Physical Therapy

## 2022-09-02 ENCOUNTER — Telehealth: Payer: Self-pay | Admitting: Physical Therapy

## 2022-09-02 ENCOUNTER — Ambulatory Visit: Payer: PPO | Admitting: Physical Therapy

## 2022-09-02 NOTE — Telephone Encounter (Signed)
Called pt to inquire about absence from PT. Pt reports that his work shift has been switched from mornings to evenings and that he will no longer be able to make his appointments and he will need to reschedule. PT will remove all of the evening appointments.

## 2022-09-16 ENCOUNTER — Encounter: Payer: PPO | Admitting: Physical Therapy

## 2022-09-21 ENCOUNTER — Encounter: Payer: PPO | Admitting: Physical Therapy

## 2022-09-23 ENCOUNTER — Encounter: Payer: PPO | Admitting: Physical Therapy

## 2022-09-28 ENCOUNTER — Encounter: Payer: PPO | Admitting: Physical Therapy

## 2022-09-30 ENCOUNTER — Encounter: Payer: PPO | Admitting: Physical Therapy

## 2022-10-05 ENCOUNTER — Encounter: Payer: PPO | Admitting: Physical Therapy

## 2022-10-07 ENCOUNTER — Encounter: Payer: PPO | Admitting: Physical Therapy

## 2022-10-12 ENCOUNTER — Encounter: Payer: PPO | Admitting: Physical Therapy

## 2022-10-14 ENCOUNTER — Encounter: Payer: PPO | Admitting: Physical Therapy

## 2022-10-19 ENCOUNTER — Encounter: Payer: PPO | Admitting: Physical Therapy

## 2022-10-19 ENCOUNTER — Telehealth: Payer: Self-pay

## 2022-10-19 NOTE — Telephone Encounter (Signed)
Spoken to patient and inform him that he is not due until March 2025 with Dr Servando Snare. I have inform patient that I will call him in January 2025.

## 2022-10-19 NOTE — Telephone Encounter (Signed)
PT received letter to schedule colonoscopy requesting call back

## 2022-10-29 ENCOUNTER — Telehealth: Payer: Self-pay | Admitting: Nurse Practitioner

## 2022-10-29 ENCOUNTER — Other Ambulatory Visit: Payer: Self-pay | Admitting: Emergency Medicine

## 2022-10-29 DIAGNOSIS — Z021 Encounter for pre-employment examination: Secondary | ICD-10-CM

## 2022-10-29 NOTE — Telephone Encounter (Signed)
Can you order or do he need to be seen

## 2022-10-29 NOTE — Telephone Encounter (Signed)
Pt is going to come in the morning for the QuantiFERON tb test.   Per Raynelle Fanning, it is ok to put order in. Thank you

## 2022-10-29 NOTE — Telephone Encounter (Signed)
Copied from CRM [id number]. Topic: General - Inquiry >> Oct 29, 2022 10:38 AM Payton Doughty wrote: Reason for CRM: pt needs the TB lab draw for a job at the [employer].

## 2022-10-29 NOTE — Telephone Encounter (Signed)
Order at front desk.

## 2022-10-30 DIAGNOSIS — Z021 Encounter for pre-employment examination: Secondary | ICD-10-CM | POA: Diagnosis not present

## 2022-11-02 LAB — QUANTIFERON-TB GOLD PLUS
Mitogen-NIL: >10 [IU]/mL
NIL: 0.07 [IU]/mL
QuantiFERON-TB Gold Plus: POSITIVE — AB
TB1-NIL: 0.35 [IU]/mL
TB2-NIL: 0.54 [IU]/mL

## 2022-11-03 ENCOUNTER — Other Ambulatory Visit: Payer: Self-pay | Admitting: Nurse Practitioner

## 2022-11-03 DIAGNOSIS — Z111 Encounter for screening for respiratory tuberculosis: Secondary | ICD-10-CM

## 2022-11-04 ENCOUNTER — Other Ambulatory Visit: Payer: Self-pay | Admitting: Emergency Medicine

## 2022-11-04 ENCOUNTER — Ambulatory Visit
Admission: RE | Admit: 2022-11-04 | Discharge: 2022-11-04 | Disposition: A | Discharge status: Home / Self Care | Payer: PPO | Source: Ambulatory Visit | Attending: Nurse Practitioner | Admitting: Nurse Practitioner

## 2022-11-04 ENCOUNTER — Ambulatory Visit
Admission: RE | Admit: 2022-11-04 | Discharge: 2022-11-04 | Disposition: A | Discharge status: Home / Self Care | Payer: PPO | Attending: Nurse Practitioner | Admitting: Nurse Practitioner

## 2022-11-04 DIAGNOSIS — R7612 Nonspecific reaction to cell mediated immunity measurement of gamma interferon antigen response without active tuberculosis: Secondary | ICD-10-CM | POA: Diagnosis not present

## 2022-11-04 DIAGNOSIS — R7611 Nonspecific reaction to tuberculin skin test without active tuberculosis: Secondary | ICD-10-CM | POA: Diagnosis not present

## 2022-11-09 ENCOUNTER — Ambulatory Visit: Payer: PPO

## 2022-11-09 VITALS — Ht 65.0 in | Wt 162.0 lb

## 2022-11-09 DIAGNOSIS — R7612 Nonspecific reaction to cell mediated immunity measurement of gamma interferon antigen response without active tuberculosis: Secondary | ICD-10-CM

## 2022-11-10 NOTE — Progress Notes (Signed)
EPI completed via phone today. [facility] re-ceived +QFT (10/30/22) results from [facility]. CXR pending.  Patient reports had TB 45 years ago and was treated. He also stated that providers in the past have said he will always test positive. Patient couldn't state how long he was treated or with what medications. TB RN asked if it was several months with multiple medications and patient stated he couldn't remember. 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  Briefly discussed LTBI vs Active TB. TB RN will call patient once CXR report is complete. Patient may come by [facility] to pick up employer letter. Richmond Campbell, RN

## 2022-11-24 NOTE — Telephone Encounter (Signed)
Copied from CRM [id number]. Topic: General - Other >> Nov 24, 2022  2:00 PM Ja-Kwan M wrote: Reason for CRM: Pt called for x-ray results. Cb# [phone]

## 2022-11-25 NOTE — Telephone Encounter (Signed)
Patient notified

## 2022-11-26 ENCOUNTER — Telehealth: Payer: Self-pay

## 2022-11-26 NOTE — Telephone Encounter (Signed)
TC to patient re: CXR. Patient would like TB nurse to mail TB employer letter to his PO box. Copy sent for scanning Richmond Campbell, RN

## 2023-01-04 ENCOUNTER — Ambulatory Visit (INDEPENDENT_AMBULATORY_CARE_PROVIDER_SITE_OTHER): Payer: PPO | Admitting: Nurse Practitioner

## 2023-01-04 ENCOUNTER — Encounter: Payer: Self-pay | Admitting: Nurse Practitioner

## 2023-01-04 VITALS — BP 126/78 | HR 88 | Temp 97.7°F | Resp 14 | Ht 65.0 in | Wt 157.4 lb

## 2023-01-04 DIAGNOSIS — E785 Hyperlipidemia, unspecified: Secondary | ICD-10-CM

## 2023-01-04 DIAGNOSIS — I10 Essential (primary) hypertension: Secondary | ICD-10-CM | POA: Diagnosis not present

## 2023-01-04 DIAGNOSIS — M1990 Unspecified osteoarthritis, unspecified site: Secondary | ICD-10-CM

## 2023-01-04 DIAGNOSIS — R053 Chronic cough: Secondary | ICD-10-CM

## 2023-01-04 DIAGNOSIS — N401 Enlarged prostate with lower urinary tract symptoms: Secondary | ICD-10-CM | POA: Diagnosis not present

## 2023-01-04 DIAGNOSIS — E1140 Type 2 diabetes mellitus with diabetic neuropathy, unspecified: Secondary | ICD-10-CM | POA: Diagnosis not present

## 2023-01-04 DIAGNOSIS — Z7984 Long term (current) use of oral hypoglycemic drugs: Secondary | ICD-10-CM

## 2023-01-04 DIAGNOSIS — R351 Nocturia: Secondary | ICD-10-CM

## 2023-01-04 DIAGNOSIS — E1169 Type 2 diabetes mellitus with other specified complication: Secondary | ICD-10-CM

## 2023-01-04 LAB — POCT GLYCOSYLATED HEMOGLOBIN (HGB A1C): Hemoglobin A1C: 7.7 % — AB (ref 4.0–5.6)

## 2023-01-04 MED ORDER — BENZONATATE 100 MG PO CAPS
200.0000 mg | ORAL_CAPSULE | Freq: Two times a day (BID) | ORAL | 0 refills | Status: DC | PRN
Start: 2023-01-04 — End: 2023-07-06

## 2023-01-04 MED ORDER — AZITHROMYCIN 250 MG PO TABS
ORAL_TABLET | ORAL | 0 refills | Status: AC
Start: 2023-01-04 — End: 2023-01-09

## 2023-01-04 NOTE — Assessment & Plan Note (Signed)
Diabetes with neuropathy, treated with gabapentin, amaryl, jardiance, metformin

## 2023-01-04 NOTE — Assessment & Plan Note (Signed)
Managed with rosuvastatin  ?

## 2023-01-04 NOTE — Progress Notes (Signed)
BP 126/78 (BP Location: Right Arm, Patient Position: Sitting, Cuff Size: Large)   Pulse 88   Temp 97.7 F (36.5 C) (Oral)   Resp 14   Ht 5\' 5"  (1.651 m) Comment: per patient  Wt 157 lb 6.4 oz (71.4 kg)   SpO2 99%   BMI 26.19 kg/m    Subjective:    Patient ID: Aaron Kill., male    DOB: 11-Nov-1945, 77 y.o.   MRN: [id number]  HPI: Aaron Basta. is a 77 y.o. male  Chief Complaint  Patient presents with   Medical Management of Chronic Issues    Discussed the use of AI scribe software for clinical note transcription with the patient, who gave verbal consent to proceed.  History of Present Illness   The patient, with a history of BPH, hypertension, type 2 diabetes with neuropathy, hyperlipidemia, and arthritis, presents with an elevated A1c of 7.7, up from previous measurements. He reports a lapse in taking his Jardiance due to cost, which may have contributed to the elevated A1c. He also reports a persistent cough and runny nose for the past two months, which over-the-counter medications have not alleviated.  The patient's blood sugar levels have been variable, with higher readings in the morning (around 150-160) and lower readings in the evening (around 87-97). His blood pressure has been stable, with readings around 123-128 systolic at home and 126/78 in the office today.  The patient has been struggling with the loss of his spouse earlier this year and has a positive depression score. He has declined medication for mood management and has considered adopting a dog for companionship.       01/04/2023    8:47 AM 07/06/2022    9:00 AM 04/07/2022   10:03 AM  Depression screen PHQ 2/9  Decreased Interest 2 2 0  Down, Depressed, Hopeless 2 2 0  PHQ - 2 Score 4 4 0  Altered sleeping 1 1   Tired, decreased energy 0 0   Change in appetite 0 0   Feeling bad or failure about yourself  1 1   Trouble concentrating 0 0   Moving slowly or fidgety/restless 0 0   Suicidal  thoughts 0 0   PHQ-9 Score 6 6   Difficult doing work/chores Very difficult Not difficult at all     Relevant past medical, surgical, family and social history reviewed and updated as indicated. Interim medical history since our last visit reviewed. Allergies and medications reviewed and updated.  Review of Systems  Constitutional: Negative for fever or weight change.  Respiratory: Negative for cough and shortness of breath.   Cardiovascular: Negative for chest pain or palpitations.  Gastrointestinal: Negative for abdominal pain, no bowel changes.  Musculoskeletal: Negative for gait problem or joint swelling.  Skin: Negative for rash.  Neurological: Negative for dizziness or headache.  No other specific complaints in a complete review of systems (except as listed in HPI above).      Objective:    BP 126/78 (BP Location: Right Arm, Patient Position: Sitting, Cuff Size: Large)   Pulse 88   Temp 97.7 F (36.5 C) (Oral)   Resp 14   Ht 5\' 5"  (1.651 m) Comment: per patient  Wt 157 lb 6.4 oz (71.4 kg)   SpO2 99%   BMI 26.19 kg/m   BP Readings from Last 3 Encounters:  01/04/23 126/78  07/06/22 130/70  04/07/22 128/76     Wt Readings from Last 3 Encounters:  01/04/23 157 lb  6.4 oz (71.4 kg)  11/10/22 162 lb (73.5 kg)  07/06/22 156 lb 14.4 oz (71.2 kg)    Physical Exam  Constitutional: Patient appears well-developed and well-nourished.  No distress.  HEENT: head atraumatic, normocephalic, pupils equal and reactive to light, neck supple Cardiovascular: Normal rate, regular rhythm and normal heart sounds.  No murmur heard. No BLE edema. Pulmonary/Chest: Effort normal and breath sounds normal. No respiratory distress. Abdominal: Soft.  There is no tenderness. Psychiatric: Patient has a normal mood and affect. behavior is normal. Judgment and thought content normal.   Last metabolic panel Lab Results  Component Value Date   GLUCOSE 99 07/06/2022   NA 140 07/06/2022   K 3.8  07/06/2022   CL 105 07/06/2022   CO2 25 07/06/2022   BUN 19 07/06/2022   CREATININE 0.77 07/06/2022   EGFR 92 07/06/2022   CALCIUM 9.4 07/06/2022   PROT 7.0 07/06/2022   ALBUMIN 4.3 05/05/2016   LABGLOB 2.7 07/19/2015   AGRATIO 1.8 07/19/2015   BILITOT 0.4 07/06/2022   ALKPHOS 57 05/05/2016   AST 16 07/06/2022   ALT 26 07/06/2022   ANIONGAP 12 08/12/2019   Last lipids Lab Results  Component Value Date   CHOL 119 07/06/2022   HDL 71 07/06/2022   LDLCALC 31 07/06/2022   TRIG 88 07/06/2022   CHOLHDL 1.7 07/06/2022   Last hemoglobin A1c Lab Results  Component Value Date   HGBA1C 7.7 (A) 01/04/2023        Assessment & Plan:   Problem List Items Addressed This Visit       Cardiovascular and Mediastinum   Hypertension goal BP (blood pressure) < 140/90 - Primary (Chronic)     Endocrine   Type 2 diabetes mellitus with diabetic neuropathy, without long-term current use of insulin (HCC)   Diabetes with neuropathy, treated with gabapentin, amaryl, jardiance, metformin       Relevant Medications   metFORMIN (GLUCOPHAGE) 1000 MG tablet   Other Relevant Orders   POCT HgB A1C (Completed)   Hyperlipidemia associated with type 2 diabetes mellitus (HCC)   Managed with rosuvastatin       Relevant Medications   metFORMIN (GLUCOPHAGE) 1000 MG tablet     Musculoskeletal and Integument   Arthritis     Genitourinary   Benign prostate hyperplasia   Other Visit Diagnoses       Persistent cough for 3 weeks or longer       Relevant Medications   azithromycin (ZITHROMAX) 250 MG tablet   benzonatate (TESSALON) 100 MG capsule   Other Relevant Orders   DG Chest 2 View        Assessment and Plan    Type 2 Diabetes Mellitus/neuropathy A1c increased to 7.7, likely due to inconsistent use of Jardiance 10mg  daily due to cost. Blood glucose levels vary with higher readings in the morning and lower in the evening. -Resume Jardiance 10mg  daily. -continue metformin 1000 mg BID,  amaryl 4 mg daily, gabapentin 300 mg TID  Upper Respiratory Infection Persistent cough and runny nose for 2 months, not responsive to over-the-counter medications. No fever or ear pain. -Prescribe antibiotic and cough medicine. -Order chest x-ray if symptoms do not improve.  Hypertension Well controlled with Losartan-Hydrochlorothiazide 100-25mg  daily. Home readings around 123-128 systolic. -Continue current medication regimen.  Hyperlipidemia Patient reports adherence to Rosuvastatin 5mg  daily. -Continue current medication regimen.  Depression Positive depression score, patient is grieving the loss of his wife. Patient declined medication for mood, previously discussed adopting a  dog. -Continue to monitor and provide emotional support.  Follow-up in 6 months.        Follow up plan: Return in about 6 months (around 07/05/2023) for follow up.

## 2023-01-18 ENCOUNTER — Encounter: Payer: Self-pay | Admitting: *Deleted

## 2023-01-26 ENCOUNTER — Telehealth: Payer: Self-pay

## 2023-01-26 NOTE — Telephone Encounter (Signed)
Pt received letter to schedule colonoscopy requesting call back

## 2023-01-27 NOTE — Telephone Encounter (Signed)
 Message left for patient to return my call.

## 2023-01-28 ENCOUNTER — Telehealth: Payer: Self-pay | Admitting: *Deleted

## 2023-01-28 ENCOUNTER — Other Ambulatory Visit: Payer: Self-pay | Admitting: *Deleted

## 2023-01-28 DIAGNOSIS — Z860100 Personal history of colon polyps, unspecified: Secondary | ICD-10-CM

## 2023-01-28 MED ORDER — NA SULFATE-K SULFATE-MG SULF 17.5-3.13-1.6 GM/177ML PO SOLN: 1.0000 | Freq: Once | ORAL | 0 refills | Status: AC

## 2023-01-28 NOTE — Telephone Encounter (Signed)
Gastroenterology Pre-Procedure Review  Request Date: 04/01/2023 Requesting Physician: Dr. Servando Snare  PATIENT REVIEW QUESTIONS: The patient responded to the following health history questions as indicated:    1. Are you having any GI issues? no 2. Do you have a personal history of Polyps? yes (last colonoscopy with 03/25/2018) 3. Do you have a family history of Colon Cancer or Polyps?  yes(mother) 4. Diabetes Mellitus? yes (taking Jardiance, glimepiride, and metformin) 5. Joint replacements in the past 12 months?no 6. Major health problems in the past 3 months?no 7. Any artificial heart valves, MVP, or defibrillator?no    MEDICATIONS & ALLERGIES:    Patient reports the following regarding taking any anticoagulation/antiplatelet therapy:   Plavix, Coumadin, Eliquis, Xarelto, Lovenox, Pradaxa, Brilinta, or Effient? no Aspirin? no  Patient confirms/reports the following medications:  Current Outpatient Medications  Medication Sig Dispense Refill   benzonatate (TESSALON) 100 MG capsule Take 2 capsules (200 mg total) by mouth 2 (two) times daily as needed for cough. 20 capsule 0   empagliflozin (JARDIANCE) 10 MG TABS tablet Take 1 tablet (10 mg total) by mouth daily. 90 tablet 3   finasteride (PROSCAR) 5 MG tablet Take 1 tablet (5 mg total) by mouth daily. 90 tablet 3   gabapentin (NEURONTIN) 300 MG capsule Take 1 capsule (300 mg total) by mouth 3 (three) times daily. 270 capsule 3   glimepiride (AMARYL) 4 MG tablet TAKE 1 TABLET BY MOUTH  DAILY WITH BREAKFAST, HOLD  IF BLOOD SUGAR &lt;100 OR IF  NOT EATING 90 tablet 3   glucose blood (ONETOUCH ULTRA) test strip Use as instructed 100 each 12   losartan-hydrochlorothiazide (HYZAAR) 100-25 MG tablet Take 1 tablet by mouth daily. For blood pressure 90 tablet 3   meloxicam (MOBIC) 15 MG tablet Take 1 tablet (15 mg total) by mouth daily. 30 tablet 0   metFORMIN (GLUCOPHAGE) 1000 MG tablet TAKE 1 TABLET BY MOUTH  TWICE DAILY WITH A MEAL 180 tablet 3    metFORMIN (GLUCOPHAGE) 1000 MG tablet Take 1 tablet by mouth 2 (two) times daily with a meal.     rosuvastatin (CRESTOR) 5 MG tablet Take 1 tablet (5 mg total) by mouth at bedtime. 90 tablet 3   No current facility-administered medications for this visit.    Patient confirms/reports the following allergies:  No Known Allergies  No orders of the defined types were placed in this encounter.   AUTHORIZATION INFORMATION Primary Insurance: 1D#: Group #:  Secondary Insurance: 1D#: Group #:  SCHEDULE INFORMATION: Date: 04/01/2023 Time: Location:  [facility]

## 2023-03-05 DIAGNOSIS — J069 Acute upper respiratory infection, unspecified: Secondary | ICD-10-CM | POA: Diagnosis not present

## 2023-03-05 DIAGNOSIS — R059 Cough, unspecified: Secondary | ICD-10-CM | POA: Diagnosis not present

## 2023-03-05 DIAGNOSIS — Z20822 Contact with and (suspected) exposure to covid-19: Secondary | ICD-10-CM | POA: Diagnosis not present

## 2023-03-25 ENCOUNTER — Encounter: Payer: Self-pay | Admitting: Gastroenterology

## 2023-04-08 ENCOUNTER — Ambulatory Visit: Payer: HMO

## 2023-04-08 VITALS — BP 130/70 | Ht 65.0 in | Wt 153.0 lb

## 2023-04-08 DIAGNOSIS — Z0000 Encounter for general adult medical examination without abnormal findings: Secondary | ICD-10-CM

## 2023-04-08 NOTE — Patient Instructions (Addendum)
 Aaron Calhoun , Thank you for taking time to come for your Medicare Wellness Visit. I appreciate your ongoing commitment to your health goals. Please review the following plan we discussed and let me know if I can assist you in the future.   Referrals/Orders/Follow-Ups/Clinician Recommendations: NONE  This is a list of the screening recommended for you and due dates:  Health Maintenance  Topic Date Due   Eye exam for diabetics  02/16/2022   COVID-19 Vaccine (10 - 2024-25 season) 03/14/2023   Colon Cancer Screening  03/25/2023   Hemoglobin A1C  07/05/2023   Yearly kidney function blood test for diabetes  07/06/2023   Yearly kidney health urinalysis for diabetes  07/06/2023   Complete foot exam   07/06/2023   Medicare Annual Wellness Visit  04/07/2024   DTaP/Tdap/Td vaccine (4 - Td or Tdap) 02/14/2031   Pneumonia Vaccine  Completed   Flu Shot  Completed   Hepatitis C Screening  Completed   Zoster (Shingles) Vaccine  Completed   HPV Vaccine  Aged Out    Advanced directives: (In Chart) A copy of your advanced directives are scanned into your chart should your provider ever need it.  Next Medicare Annual Wellness Visit scheduled for next year: Yes   04/13/24 @ 8:50 AM IN PERSON
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
## 2023-04-15 ENCOUNTER — Ambulatory Visit: Admitting: Anesthesiology

## 2023-04-15 ENCOUNTER — Ambulatory Visit
Admission: RE | Admit: 2023-04-15 | Discharge: 2023-04-15 | Disposition: A | Discharge status: Home / Self Care | Payer: PPO | Attending: Gastroenterology | Admitting: Gastroenterology

## 2023-04-15 ENCOUNTER — Encounter: Payer: Self-pay | Admitting: Gastroenterology

## 2023-04-15 ENCOUNTER — Encounter
Admission: RE | Disposition: A | Discharge status: Home / Self Care | Payer: Self-pay | Source: Home / Self Care | Attending: Gastroenterology

## 2023-04-15 DIAGNOSIS — K635 Polyp of colon: Secondary | ICD-10-CM

## 2023-04-15 DIAGNOSIS — K5730 Diverticulosis of large intestine without perforation or abscess without bleeding: Secondary | ICD-10-CM | POA: Insufficient documentation

## 2023-04-15 DIAGNOSIS — Z860100 Personal history of colon polyps, unspecified: Secondary | ICD-10-CM | POA: Diagnosis not present

## 2023-04-15 DIAGNOSIS — Z1211 Encounter for screening for malignant neoplasm of colon: Secondary | ICD-10-CM | POA: Diagnosis not present

## 2023-04-15 DIAGNOSIS — D120 Benign neoplasm of cecum: Secondary | ICD-10-CM | POA: Insufficient documentation

## 2023-04-15 DIAGNOSIS — K640 First degree hemorrhoids: Secondary | ICD-10-CM | POA: Diagnosis not present

## 2023-04-15 DIAGNOSIS — I10 Essential (primary) hypertension: Secondary | ICD-10-CM | POA: Diagnosis not present

## 2023-04-15 HISTORY — PX: COLONOSCOPY WITH PROPOFOL: SHX5780

## 2023-04-15 HISTORY — PX: POLYPECTOMY: SHX5525

## 2023-04-15 LAB — GLUCOSE, CAPILLARY: Glucose-Capillary: 189 mg/dL — ABNORMAL HIGH (ref 70–99)

## 2023-04-15 SURGERY — COLONOSCOPY WITH PROPOFOL
Anesthesia: General

## 2023-04-15 MED ORDER — SODIUM CHLORIDE 0.9 % IV SOLN: INTRAVENOUS | Status: DC

## 2023-04-15 MED ORDER — LIDOCAINE HCL (CARDIAC) PF 100 MG/5ML IV SOSY
PREFILLED_SYRINGE | INTRAVENOUS | Status: DC | PRN
  Administered 2023-04-15: 60 mg via INTRAVENOUS

## 2023-04-15 MED ORDER — PROPOFOL 500 MG/50ML IV EMUL
INTRAVENOUS | Status: DC | PRN
  Administered 2023-04-15: 50 ug/kg/min via INTRAVENOUS

## 2023-04-15 MED ORDER — DEXMEDETOMIDINE HCL IN NACL 80 MCG/20ML IV SOLN
INTRAVENOUS | Status: DC | PRN
  Administered 2023-04-15: 20 ug via INTRAVENOUS

## 2023-04-15 MED ORDER — PROPOFOL 10 MG/ML IV BOLUS
INTRAVENOUS | Status: DC | PRN
  Administered 2023-04-15: 20 mg via INTRAVENOUS
  Administered 2023-04-15: 40 mg via INTRAVENOUS
  Administered 2023-04-15 (×2): 20 mg via INTRAVENOUS

## 2023-04-15 MED ORDER — LIDOCAINE HCL (PF) 2 % IJ SOLN
INTRAMUSCULAR | Status: AC
  Filled 2023-04-15: qty 5

## 2023-04-15 NOTE — Transfer of Care (Signed)
 Immediate Anesthesia Transfer of Care Note  Patient: Aaron Calhoun.  Procedure(s) Performed: COLONOSCOPY WITH PROPOFOL POLYPECTOMY  Patient Location: PACU  Anesthesia Type:General  Level of Consciousness: sedated  Airway & Oxygen Therapy: Patient Spontanous Breathing  Post-op Assessment: Report given to RN and Post -op Vital signs reviewed and stable  Post vital signs: Reviewed and stable  Last Vitals:  Vitals Value Taken Time  BP    Temp    Pulse 69 04/15/23 1144  Resp 22 04/15/23 1144  SpO2 98 % 04/15/23 1144  Vitals shown include unfiled device data.  Last Pain:  Vitals:   04/15/23 1016  TempSrc: Temporal  PainSc: 0-No pain         Complications: No notable events documented.
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
## 2023-04-15 NOTE — Op Note (Signed)
 [facility] Gastroenterology Patient Name: Aaron Calhoun Procedure Date: 04/15/2023 11:14 AM MRN: [id number] Account #: [id number] Date of Birth: 03/11/1945 Admit Type: Outpatient Age: 78 Room: [facility] ENDO ROOM 4 Gender: Male Note Status: Finalized Instrument Name: Prentice Docker [id number] Procedure:             Colonoscopy Indications:           High risk colon cancer surveillance: Personal history                         of colonic polyps Providers:             Midge Minium MD, MD Referring MD:          Rudolpho Sevin. Pender (Referring MD) Medicines:             Propofol per Anesthesia Complications:         No immediate complications. Procedure:             Pre-Anesthesia Assessment:                        - Prior to the procedure, a History and Physical was                         performed, and patient medications and allergies were                         reviewed. The patient's tolerance of previous                         anesthesia was also reviewed. The risks and benefits                         of the procedure and the sedation options and risks                         were discussed with the patient. All questions were                         answered, and informed consent was obtained. Prior                         Anticoagulants: The patient has taken no anticoagulant                         or antiplatelet agents. ASA Grade Assessment: II - A                         patient with mild systemic disease. After reviewing                         the risks and benefits, the patient was deemed in                         satisfactory condition to undergo the procedure.                        After obtaining informed consent, the colonoscope was  passed under direct vision. Throughout the procedure,                         the patient's blood pressure, pulse, and oxygen                         saturations were monitored continuously. The                          Colonoscope was introduced through the anus and                         advanced to the the cecum, identified by appendiceal                         orifice and ileocecal valve. The colonoscopy was                         performed without difficulty. The patient tolerated                         the procedure well. The quality of the bowel                         preparation was excellent. Findings:      The perianal and digital rectal examinations were normal.      Two sessile polyps were found in the cecum. The polyps were 1 to 2 mm in       size. These polyps were removed with a cold biopsy forceps. Resection       and retrieval were complete.      Non-bleeding internal hemorrhoids were found during retroflexion. The       hemorrhoids were Grade I (internal hemorrhoids that do not prolapse).      A few small-mouthed diverticula were found in the entire colon. Impression:            - Two 1 to 2 mm polyps in the cecum, removed with a                         cold biopsy forceps. Resected and retrieved.                        - Non-bleeding internal hemorrhoids.                        - Diverticulosis in the entire examined colon. Recommendation:        - Discharge patient to home.                        - Resume previous diet.                        - Continue present medications.                        - Await pathology results.                        - Repeat colonoscopy is not recommended for  surveillance. Procedure Code(s):     --- Professional ---                        [phone], Colonoscopy, flexible; with biopsy, single or                         multiple Diagnosis Code(s):     --- Professional ---                        Z86.010, Personal history of colonic polyps                        D12.0, Benign neoplasm of cecum CPT copyright 2022 American Medical Association. All rights reserved. The codes documented in this report are preliminary and  upon coder review may  be revised to meet current compliance requirements. Midge Minium MD, MD 04/15/2023 11:41:38 AM This report has been signed electronically. Number of Addenda: 0 Note Initiated On: 04/15/2023 11:14 AM Scope Withdrawal Time: 0 hours 6 minutes 21 seconds  Total Procedure Duration: 0 hours 10 minutes 38 seconds  Estimated Blood Loss:  Estimated blood loss: none.      [facility]

## 2023-04-16 ENCOUNTER — Encounter: Payer: Self-pay | Admitting: Gastroenterology

## 2023-04-16 NOTE — Anesthesia Postprocedure Evaluation (Signed)
 Anesthesia Post Note  Patient: Mahamed Zalewski.  Procedure(s) Performed: COLONOSCOPY WITH PROPOFOL POLYPECTOMY  Patient location during evaluation: Endoscopy Anesthesia Type: General Level of consciousness: awake and alert Pain management: pain level controlled Vital Signs Assessment: post-procedure vital signs reviewed and stable Respiratory status: spontaneous breathing, nonlabored ventilation, respiratory function stable and patient connected to nasal cannula oxygen Cardiovascular status: blood pressure returned to baseline and stable Postop Assessment: no apparent nausea or vomiting Anesthetic complications: no   No notable events documented.   Last Vitals:  Vitals:   04/15/23 1016 04/15/23 1144  BP: (!) 142/84 (!) 110/91  Pulse: 73   Resp: 17   Temp: (!) 35.6 C (!) 35.8 C  SpO2: 100%     Last Pain:  Vitals:   04/16/23 0752  TempSrc:   PainSc: 0-No pain                 Louie Boston

## 2023-04-19 LAB — SURGICAL PATHOLOGY

## 2023-04-20 ENCOUNTER — Encounter: Payer: Self-pay | Admitting: Gastroenterology

## 2023-05-06 ENCOUNTER — Telehealth: Payer: Self-pay | Admitting: Nurse Practitioner

## 2023-05-06 NOTE — Telephone Encounter (Signed)
 Pt has 1 year supply not due

## 2023-05-06 NOTE — Telephone Encounter (Signed)
rosuvastatin (CRESTOR) 5 MG tablet

## 2023-05-10 ENCOUNTER — Telehealth: Payer: Self-pay | Admitting: Nurse Practitioner

## 2023-05-10 NOTE — Telephone Encounter (Signed)
 Should be good on refill til June was given yr supply

## 2023-05-10 NOTE — Telephone Encounter (Signed)
glimepiride (AMARYL) 4 MG tablet ° °

## 2023-05-11 ENCOUNTER — Other Ambulatory Visit: Payer: Self-pay | Admitting: Nurse Practitioner

## 2023-05-11 DIAGNOSIS — G8928 Other chronic postprocedural pain: Secondary | ICD-10-CM

## 2023-05-11 DIAGNOSIS — M1990 Unspecified osteoarthritis, unspecified site: Secondary | ICD-10-CM

## 2023-05-11 MED ORDER — GABAPENTIN 300 MG PO CAPS: 300.0000 mg | ORAL_CAPSULE | Freq: Three times a day (TID) | ORAL | 3 refills | Status: DC

## 2023-05-11 NOTE — Telephone Encounter (Signed)
 New pahramacy

## 2023-05-11 NOTE — Telephone Encounter (Signed)
 Refill request for gabapentin  300mg  3x daily. Pharmacy will not give it to him but it say it is good until June. Send to [facility]

## 2023-05-25 ENCOUNTER — Telehealth: Payer: Self-pay

## 2023-05-25 DIAGNOSIS — E1140 Type 2 diabetes mellitus with diabetic neuropathy, unspecified: Secondary | ICD-10-CM

## 2023-05-25 MED ORDER — ONETOUCH ULTRA VI STRP: ORAL_STRIP | 12 refills | Status: AC

## 2023-05-25 NOTE — Telephone Encounter (Signed)
 Refills

## 2023-07-06 ENCOUNTER — Ambulatory Visit: Payer: Self-pay | Admitting: Nurse Practitioner

## 2023-07-06 ENCOUNTER — Encounter: Payer: Self-pay | Admitting: Nurse Practitioner

## 2023-07-06 VITALS — BP 128/72 | HR 95 | Temp 98.3°F | Resp 16 | Ht 65.0 in | Wt 156.0 lb

## 2023-07-06 DIAGNOSIS — E1140 Type 2 diabetes mellitus with diabetic neuropathy, unspecified: Secondary | ICD-10-CM

## 2023-07-06 DIAGNOSIS — Z7984 Long term (current) use of oral hypoglycemic drugs: Secondary | ICD-10-CM | POA: Diagnosis not present

## 2023-07-06 DIAGNOSIS — E785 Hyperlipidemia, unspecified: Secondary | ICD-10-CM | POA: Diagnosis not present

## 2023-07-06 DIAGNOSIS — G8928 Other chronic postprocedural pain: Secondary | ICD-10-CM

## 2023-07-06 DIAGNOSIS — R351 Nocturia: Secondary | ICD-10-CM

## 2023-07-06 DIAGNOSIS — I10 Essential (primary) hypertension: Secondary | ICD-10-CM

## 2023-07-06 DIAGNOSIS — N401 Enlarged prostate with lower urinary tract symptoms: Secondary | ICD-10-CM

## 2023-07-06 DIAGNOSIS — E1169 Type 2 diabetes mellitus with other specified complication: Secondary | ICD-10-CM

## 2023-07-06 DIAGNOSIS — M1990 Unspecified osteoarthritis, unspecified site: Secondary | ICD-10-CM | POA: Diagnosis not present

## 2023-07-06 MED ORDER — LOSARTAN POTASSIUM-HCTZ 100-25 MG PO TABS
1.0000 | ORAL_TABLET | Freq: Every day | ORAL | 3 refills | Status: AC
Start: 2023-07-06 — End: ?

## 2023-07-06 MED ORDER — EMPAGLIFLOZIN 10 MG PO TABS: 10.0000 mg | ORAL_TABLET | Freq: Every day | ORAL | 3 refills | Status: DC

## 2023-07-06 MED ORDER — METFORMIN HCL 1000 MG PO TABS: ORAL_TABLET | ORAL | 3 refills | Status: AC

## 2023-07-06 MED ORDER — FINASTERIDE 5 MG PO TABS: 5.0000 mg | ORAL_TABLET | Freq: Every day | ORAL | 3 refills | Status: AC

## 2023-07-06 MED ORDER — GLIMEPIRIDE 4 MG PO TABS: ORAL_TABLET | ORAL | 3 refills | Status: AC

## 2023-07-06 MED ORDER — GABAPENTIN 300 MG PO CAPS: 300.0000 mg | ORAL_CAPSULE | Freq: Three times a day (TID) | ORAL | 3 refills | Status: AC

## 2023-07-06 MED ORDER — ROSUVASTATIN CALCIUM 5 MG PO TABS: 5.0000 mg | ORAL_TABLET | Freq: Every day | ORAL | 3 refills | Status: AC

## 2023-07-06 NOTE — Progress Notes (Signed)
 BP 128/72 (Cuff Size: Large)   Pulse 95   Temp 98.3 F (36.8 C) (Oral)   Resp 16   Ht 5' 5 (1.651 m)   Wt 156 lb (70.8 kg)   SpO2 98%   BMI 25.96 kg/m    Subjective:    Patient ID: Aaron Calhoun., male    DOB: Dec 18, 1945, 78 y.o.   MRN: [id number]  HPI: Aaron Calhoun. is a 78 y.o. male  Chief Complaint  Patient presents with   Medical Management of Chronic Issues    6 month recheck    Discussed the use of AI scribe software for clinical note transcription with the patient, who gave verbal consent to proceed.  History of Present Illness [name] is a 78 year old male who presents for routine follow-up.  He has type 2 diabetes mellitus with fluctuating blood glucose levels, experiencing higher readings in the morning around 171 mg/dL and lower readings in the evening, sometimes as low as 65 mg/dL. He is currently on Jardiance  10 mg daily, Amaryl  4 mg at breakfast, and Metformin  1000 mg twice a day. His last A1c was 7.7% in December 2024. He is overdue for an eye exam, microalbumin urine test, and foot exam.  His hypertension is managed with Losartan -Hydrochlorothiazide  100-25 mg daily, and his blood pressure is well-controlled.  He manages hyperlipidemia with Rosuvastatin  5 mg daily.  He has arthritis and previously took Meloxicam  15 mg daily but is no longer on it. He continues to experience pain occasionally , particularly in the knee after walking ( which has resolved), and is currently taking Gabapentin  300 mg three times a day for pain management. An x-ray of his knee did not reveal any specific issues.  He experiences urinary symptoms related to benign prostatic hyperplasia (BPH) and nocturia, for which he takes Proscar  5 mg daily. His urinary symptoms are currently stable.  He reports a persistent cough, which occurs daily and produces clear sputum. No shortness of breath or wheezing. A chest x-ray in October 2024 was normal.          07/06/2023     7:35 AM 04/08/2023    8:39 AM 01/04/2023    8:47 AM  Depression screen PHQ 2/9  Decreased Interest 0 1 2  Down, Depressed, Hopeless 0 1 2  PHQ - 2 Score 0 2 4  Altered sleeping  0 1  Tired, decreased energy  1 0  Change in appetite  0 0  Feeling bad or failure about yourself   0 1  Trouble concentrating  0 0  Moving slowly or fidgety/restless  0 0  Suicidal thoughts  0 0  PHQ-9 Score  3 6  Difficult doing work/chores  Not difficult at all Very difficult    Relevant past medical, surgical, family and social history reviewed and updated as indicated. Interim medical history since our last visit reviewed. Allergies and medications reviewed and updated.  Review of Systems  Constitutional: Negative for fever or weight change.  Respiratory: positive for cough and negative for  shortness of breath.   Cardiovascular: Negative for chest pain or palpitations.  Gastrointestinal: Negative for abdominal pain, no bowel changes.  Musculoskeletal: Negative for gait problem or joint swelling.  Skin: Negative for rash.  Neurological: Negative for dizziness or headache.  No other specific complaints in a complete review of systems (except as listed in HPI above).      Objective:     BP 128/72 (Cuff Size: Large)  Pulse 95   Temp 98.3 F (36.8 C) (Oral)   Resp 16   Ht 5' 5 (1.651 m)   Wt 156 lb (70.8 kg)   SpO2 98%   BMI 25.96 kg/m    Wt Readings from Last 3 Encounters:  07/06/23 156 lb (70.8 kg)  04/15/23 148 lb 9.6 oz (67.4 kg)  04/08/23 153 lb (69.4 kg)    Physical Exam Physical Exam VITALS: BP- 128/72 GENERAL: Alert, cooperative, well developed, no acute distress HEENT: Normocephalic, normal oropharynx, moist mucous membranes CHEST: Clear to auscultation bilaterally, no wheezes, rhonchi, or crackles CARDIOVASCULAR: Normal heart rate and rhythm, S1 and S2 normal without murmurs ABDOMEN: Soft, non-tender, non-distended, without organomegaly, normal bowel sounds EXTREMITIES:  Normal sensation, no cyanosis or edema NEUROLOGICAL: Cranial nerves grossly intact, moves all extremities without gross motor or sensory deficit Diabetic Foot Exam - Simple   Simple Foot Form Diabetic Foot exam was performed with the following findings: Yes 07/06/2023  7:50 AM  Visual Inspection No deformities, no ulcerations, no other skin breakdown bilaterally: Yes Sensation Testing Intact to touch and monofilament testing bilaterally: Yes Pulse Check Posterior Tibialis and Dorsalis pulse intact bilaterally: Yes Comments       Results for orders placed or performed during the hospital encounter of 04/15/23  Surgical pathology   Collection Time: 04/15/23 12:00 AM  Result Value Ref Range   SURGICAL PATHOLOGY      SURGICAL PATHOLOGY [facility] [address] [place], [place] [address] Telephone [phone] or [phone] Fax [phone]  REPORT OF SURGICAL PATHOLOGY   Accession #: [id number] Patient Name: Aaron Calhoun Visit # : [id number]  MRN: [id number] Physician: Jinny Carmine DOB/Age [birth year] (Age: 78) Gender: M Collected Date: 04/15/2023 Received Date: 04/15/2023  FINAL DIAGNOSIS       1. Cecum Polyp, X2 cbx :       - TUBULAR ADENOMA, ONE FRAGMENT.  NEGATIVE FOR HIGH GRADE DYSPLASIA OR      MALIGNANCY. SEE COMMENT.      - COLONIC MUCOSA, TWO FRAGMENTS.  NEGATIVE FOR DYSPLASIA OR MALIGNANCY.       DATE SIGNED OUT: 04/19/2023 ELECTRONIC SIGNATURE : [place] Md, Mark, Pathologist, Electronic Signature  MICROSCOPIC DESCRIPTION 1. COMMENT:  The adenomatous process is identified with deeper levels.  (MJ:gt, 04/19/23)  CASE COMMENTS STAINS USED IN DIAGNOSIS: H&E *RECUT DEEPER X 6 LEVELS *RECUT DEEPER X 6 LEVELS    CLINICAL  HISTORY  SPECIMEN(S) OBTAINED 1. Cecum Polyp, X2 Cbx  SPECIMEN COMMENTS: SPECIMEN CLINICAL INFORMATION: 1. Personal history of colon polyps. Colon polyps, diverticulosis    Gross Description 1.  CBX cecal colon polyp x2, received in formalin is a  0.6 x 0.2 x 0.1 cm aggregate of 3 tan-pink tissue fragments. The specimen is submitted in toto in 1 block (1A).      AMG 04/15/2023        Report signed out from the following location(s) [facility] [address] [place], [place] [address] CLIA #: [id number]  [facility] [address] [place], [place] [address] CLIA #: [id number]   Glucose, capillary   Collection Time: 04/15/23 10:15 AM  Result Value Ref Range   Glucose-Capillary 189 (H) 70 - 99 mg/dL          Assessment & Plan:   Problem List Items Addressed This Visit       Cardiovascular and Mediastinum   Hypertension goal BP (blood pressure) < 140/90 - Primary (Chronic)  Relevant Medications   losartan -hydrochlorothiazide  (HYZAAR) 100-25 MG tablet   rosuvastatin  (CRESTOR ) 5 MG tablet   Other Relevant Orders   CBC with Differential/Platelet   Comprehensive metabolic panel with GFR     Endocrine   Type 2 diabetes mellitus with diabetic neuropathy, without long-term current use of insulin (HCC)   Relevant Medications   empagliflozin  (JARDIANCE ) 10 MG TABS tablet   glimepiride  (AMARYL ) 4 MG tablet   losartan -hydrochlorothiazide  (HYZAAR) 100-25 MG tablet   metFORMIN  (GLUCOPHAGE ) 1000 MG tablet   rosuvastatin  (CRESTOR ) 5 MG tablet   Other Relevant Orders   Comprehensive metabolic panel with GFR   Microalbumin / creatinine urine ratio   Hemoglobin A1c   HM Diabetes Foot Exam (Completed)   Hyperlipidemia associated with type 2 diabetes mellitus (HCC)   Relevant Medications   empagliflozin  (JARDIANCE ) 10 MG TABS tablet   glimepiride  (AMARYL ) 4 MG tablet   losartan -hydrochlorothiazide  (HYZAAR) 100-25 MG tablet   metFORMIN  (GLUCOPHAGE ) 1000 MG tablet   rosuvastatin  (CRESTOR ) 5 MG tablet   Other Relevant Orders   Comprehensive metabolic panel with GFR   Lipid panel     Musculoskeletal and Integument   Arthritis    Relevant Medications   gabapentin  (NEURONTIN ) 300 MG capsule     Genitourinary   Benign prostate hyperplasia   Relevant Medications   finasteride  (PROSCAR ) 5 MG tablet   Other Relevant Orders   PSA     Other   Chronic post-operative pain   Relevant Medications   gabapentin  (NEURONTIN ) 300 MG capsule     Assessment and Plan Assessment & Plan Type 2 diabetes mellitus Type 2 diabetes mellitus with recent A1c of 7.4%. Reports hyperglycemia in the morning (171 mg/dL) and hypoglycemia before dinner (65-70 mg/dL). - Continue current diabetes medications: Jardiance , Amaryl , and Metformin . - Order microalbumin urine test. - Schedule overdue ophthalmologic and podiatric exams completed  Hypertension Hypertension well-controlled with current medication regimen. Blood pressure today is 128/72 mmHg. - Continue current antihypertensive medication: Losartan -Hydrochlorothiazide .  Benign prostatic hyperplasia with lower urinary tract symptoms Benign prostatic hyperplasia with no new urinary complaints. - Continue current medication: Proscar . - Order PSA test as it has not been done for a year.  Arthritis Arthritis with intermittent pain managed with gabapentin , though some discomfort persists, especially post-ambulation. - Continue gabapentin  for pain management.  Chronic cough Chronic cough with clear sputum production. No dyspnea or wheezing. Lungs auscultated clear. Previous chest x-ray in October 2024 was normal. - Encourage increased water  intake to thin mucus. - Recommend over-the-counter guaifenesin to help thin mucus.        Follow up plan: Return in about 6 months (around 01/05/2024) for follow up.

## 2023-07-07 ENCOUNTER — Ambulatory Visit: Payer: Self-pay | Admitting: Nurse Practitioner

## 2023-07-07 ENCOUNTER — Other Ambulatory Visit: Payer: Self-pay

## 2023-07-07 LAB — CBC WITH DIFFERENTIAL/PLATELET
Absolute Lymphocytes: 2505 {cells}/uL (ref 850–3900)
Absolute Monocytes: 718 {cells}/uL (ref 200–950)
Basophils Absolute: 21 {cells}/uL (ref 0–200)
Basophils Relative: 0.3 %
Eosinophils Absolute: 207 {cells}/uL (ref 15–500)
Eosinophils Relative: 3 %
HCT: 43.5 % (ref 38.5–50.0)
Hemoglobin: 14.3 g/dL (ref 13.2–17.1)
MCH: 31.6 pg (ref 27.0–33.0)
MCHC: 32.9 g/dL (ref 32.0–36.0)
MCV: 96 fL (ref 80.0–100.0)
MPV: 10.9 fL (ref 7.5–12.5)
Monocytes Relative: 10.4 %
Neutro Abs: 3450 {cells}/uL (ref 1500–7800)
Neutrophils Relative %: 50 %
Platelets: 190 10*3/uL (ref 140–400)
RBC: 4.53 10*6/uL (ref 4.20–5.80)
RDW: 11.9 % (ref 11.0–15.0)
Total Lymphocyte: 36.3 %
WBC: 6.9 10*3/uL (ref 3.8–10.8)

## 2023-07-07 LAB — COMPREHENSIVE METABOLIC PANEL WITH GFR
AG Ratio: 1.3 (calc) (ref 1.0–2.5)
ALT: 27 U/L (ref 9–46)
AST: 15 U/L (ref 10–35)
Albumin: 4.3 g/dL (ref 3.6–5.1)
Alkaline phosphatase (APISO): 69 U/L (ref 35–144)
BUN: 16 mg/dL (ref 7–25)
CO2: 27 mmol/L (ref 20–32)
Calcium: 9.2 mg/dL (ref 8.6–10.3)
Chloride: 100 mmol/L (ref 98–110)
Creat: 0.75 mg/dL (ref 0.70–1.28)
Globulin: 3.2 g/dL (ref 1.9–3.7)
Glucose, Bld: 164 mg/dL — ABNORMAL HIGH (ref 65–99)
Potassium: 4 mmol/L (ref 3.5–5.3)
Sodium: 137 mmol/L (ref 135–146)
Total Bilirubin: 0.4 mg/dL (ref 0.2–1.2)
Total Protein: 7.5 g/dL (ref 6.1–8.1)
eGFR: 92 mL/min/{1.73_m2} (ref >=60)

## 2023-07-07 LAB — LIPID PANEL
Cholesterol: 129 mg/dL (ref <200)
HDL: 76 mg/dL (ref >=40)
LDL Cholesterol (Calc): 36 mg/dL
Non-HDL Cholesterol (Calc): 53 mg/dL (ref <130)
Total CHOL/HDL Ratio: 1.7 (calc) (ref <5.0)
Triglycerides: 85 mg/dL (ref <150)

## 2023-07-07 LAB — MICROALBUMIN / CREATININE URINE RATIO
Creatinine, Urine: 44 mg/dL (ref 20–320)
Microalb Creat Ratio: 23 mg/g{creat} (ref <30)
Microalb, Ur: 1 mg/dL

## 2023-07-07 LAB — HEMOGLOBIN A1C
Hgb A1c MFr Bld: 7.9 % — ABNORMAL HIGH (ref <5.7)
Mean Plasma Glucose: 180 mg/dL
eAG (mmol/L): 10 mmol/L

## 2023-07-07 LAB — PSA: PSA: 0.37 ng/mL (ref <=4.00)

## 2023-07-07 MED ORDER — EMPAGLIFLOZIN 25 MG PO TABS: 25.0000 mg | ORAL_TABLET | Freq: Every day | ORAL | 0 refills | Status: DC

## 2023-09-07 ENCOUNTER — Telehealth: Payer: Self-pay | Admitting: Nurse Practitioner

## 2023-09-07 NOTE — Telephone Encounter (Signed)
 empagliflozin  (JARDIANCE ) 25 MG TABS tablet   90 day supply

## 2023-09-07 NOTE — Telephone Encounter (Signed)
No refill needed at this time.

## 2023-09-22 DIAGNOSIS — H903 Sensorineural hearing loss, bilateral: Secondary | ICD-10-CM | POA: Diagnosis not present

## 2023-09-22 DIAGNOSIS — H6123 Impacted cerumen, bilateral: Secondary | ICD-10-CM | POA: Diagnosis not present

## 2023-09-24 ENCOUNTER — Other Ambulatory Visit: Payer: Self-pay

## 2023-09-24 MED ORDER — EMPAGLIFLOZIN 25 MG PO TABS: 25.0000 mg | ORAL_TABLET | Freq: Every day | ORAL | 0 refills | Status: DC

## 2023-09-26 DIAGNOSIS — Z20822 Contact with and (suspected) exposure to covid-19: Secondary | ICD-10-CM | POA: Diagnosis not present

## 2023-09-26 DIAGNOSIS — J0110 Acute frontal sinusitis, unspecified: Secondary | ICD-10-CM | POA: Diagnosis not present

## 2023-10-01 ENCOUNTER — Emergency Department
Admission: EM | Admit: 2023-10-01 | Discharge: 2023-10-01 | Disposition: A | Discharge status: Home / Self Care | Attending: Emergency Medicine | Admitting: Emergency Medicine

## 2023-10-01 ENCOUNTER — Other Ambulatory Visit: Payer: Self-pay

## 2023-10-01 ENCOUNTER — Emergency Department

## 2023-10-01 DIAGNOSIS — E119 Type 2 diabetes mellitus without complications: Secondary | ICD-10-CM | POA: Diagnosis not present

## 2023-10-01 DIAGNOSIS — I10 Essential (primary) hypertension: Secondary | ICD-10-CM | POA: Insufficient documentation

## 2023-10-01 DIAGNOSIS — R519 Headache, unspecified: Secondary | ICD-10-CM | POA: Insufficient documentation

## 2023-10-01 LAB — CBG MONITORING, ED: Glucose-Capillary: 164 mg/dL — ABNORMAL HIGH (ref 70–99)

## 2023-10-01 MED ORDER — HYDROCODONE-ACETAMINOPHEN 5-325 MG PO TABS: 1.0000 | ORAL_TABLET | Freq: Three times a day (TID) | ORAL | 0 refills | Status: DC | PRN

## 2023-10-01 MED ORDER — PREDNISONE 10 MG PO TABS: ORAL_TABLET | ORAL | 0 refills | Status: DC

## 2023-10-01 NOTE — ED Notes (Signed)
 See triage note  Presents with frontal headache  States he has had this pain for 2 weeks Pain is mainly behind eyes  Denies any fever,n/v or trauma Was seen at [facility]  and placed on antibiotic but is not sure why

## 2023-10-01 NOTE — ED Triage Notes (Addendum)
 Pt comes with headache for two weeks. Pt denies any nausea or dizziness. Pt states it is pain at front of his head.   Pt state he went to [facility] and they performed covid test which was neg. Pt states last year he had headache and it was from sinus.

## 2023-10-01 NOTE — Discharge Instructions (Signed)
 Keep your appointment with the [facility] Follow-up with Dr. Mcqueen if you continue having problems with your sinuses.  A prescription for prednisone  was sent to the pharmacy for you to take for the next 5 days and continue using your Flonase nasal spray daily.  The prednisone  can increase your blood sugar so be aware to watch what you are eating so that your blood sugar does not elevate a great deal while on the medication.  This is temporary.  Also prescription for pain medication was sent to the pharmacy to take if needed for headache.  Do not drive or operate machinery while taking this medication as it could cause drowsiness and increase your risk for injury.

## 2023-10-01 NOTE — ED Provider Notes (Signed)
 [facility] Provider Note    None    (approximate)   History   Headache   HPI  Aaron Calhoun. is a 78 y.o. male presents to the ED with complaint of headache daily for the last 2 weeks.  Patient denies any visual changes, nausea, vomiting, dizziness.  He also denies any fever or chills.  He states that he went to urgent care and COVID test was negative.  He was given a prescription for Augmentin  and told that he likely had a sinus infection.  He states that he is currently taking the medication but it has not helped his headache.  Patient has a history of hypertension, diabetes type 2, arthritis.     Physical Exam   Triage Vital Signs: ED Triage Vitals  Encounter Vitals Group     BP 10/01/23 0711 (!) 166/95     Girls Systolic BP Percentile --      Girls Diastolic BP Percentile --      Boys Systolic BP Percentile --      Boys Diastolic BP Percentile --      Pulse Rate 10/01/23 0711 75     Resp 10/01/23 0711 18     Temp 10/01/23 0711 (!) 97.5 F (36.4 C)     Temp src --      SpO2 10/01/23 0711 96 %     Weight 10/01/23 0711 158 lb (71.7 kg)     Height 10/01/23 0711 5' 5 (1.651 m)     Head Circumference --      Peak Flow --      Pain Score 10/01/23 0710 5     Pain Loc --      Pain Education --      Exclude from Growth Chart --     Most recent vital signs: Vitals:   10/01/23 0711  BP: (!) 166/95  Pulse: 75  Resp: 18  Temp: (!) 97.5 F (36.4 C)  SpO2: 96%     General: Awake, no distress.  Alert, talkative, answers questions appropriately. CV:  Good peripheral perfusion.  Heart rate rate rhythm. Resp:  Normal effort.  Lungs clear bilaterally. Abd:  No distention.  Other:  PERRLA, EOMI's, cranial nerves II through XII grossly intact.  Speech is normal.  Patient is able to stand and ambulate without any assistance.  Good muscle strength bilaterally both upper and lower extremities.  No point tenderness on palpation of the cervical spine  posteriorly.  There is some tenderness noted on percussion of the frontal sinus area.  Posterior pharynx clear.   ED Results / Procedures / Treatments   Labs (all labs ordered are listed, but only abnormal results are displayed) Labs Reviewed  CBG MONITORING, ED - Abnormal; Notable for the following components:      Result Value   Glucose-Capillary 164 (*)    All other components within normal limits     RADIOLOGY CT head per radiology is negative for acute intracranial changes.  Chronic paranasal sinus disease noted.    PROCEDURES:  Critical Care performed:   Procedures   MEDICATIONS ORDERED IN ED: Medications - No data to display   IMPRESSION / MDM / ASSESSMENT AND PLAN / ED COURSE  I reviewed the triage vital signs and the nursing notes.   Differential diagnosis includes, but is not limited to, chronic headache, muscle contraction headache, migraine, space-occupying lesion to the brain, sinusitis, sinus disease, seasonal allergies.  78 year old male presents to the ED with  complaint of headache for the last 2 weeks.  He is currently taking Augmentin  for what he believed to be a sinus infection.  He is also on his own using Flonase nasal spray daily.  He also has seen Dr. Mcqueen for something unrelated and is going to the [facility] for hearing check and hearing aids.  At this time it appears that patient has a sinus infection causing his chronic headache.  CT scan of his head was reassuring and patient was made aware.  A short course of steroids was prescribed and patient was encouraged to continue using Flonase nasal spray.  He is also to continue taking the Augmentin  prescribed for him for his sinus infection.  If he continues to have problems with his sinus he is to follow-up with Dr. Mcqueen for the [facility].  A prescription for hydrocodone  was sent to the pharmacy for him to take every 8 hours if needed for moderate to severe headache.      Patient's  presentation is most consistent with acute complicated illness / injury requiring diagnostic workup.  FINAL CLINICAL IMPRESSION(S) / ED DIAGNOSES   Final diagnoses:  Sinus headache     Rx / DC Orders   ED Discharge Orders          Ordered    predniSONE  (DELTASONE ) 10 MG tablet        10/01/23 0818    HYDROcodone -acetaminophen  (NORCO/VICODIN) 5-325 MG tablet  Every 8 hours PRN        10/01/23 0818             Note:  This document was prepared using Dragon voice recognition software and may include unintentional dictation errors.   Saunders Shona CROME, PA-C 10/01/23 1409    Viviann Pastor, MD 10/01/23 [phone]

## 2023-10-04 ENCOUNTER — Ambulatory Visit: Payer: Self-pay

## 2023-10-04 NOTE — Progress Notes (Signed)
 Pharmacy Quality Measure Review  This patient is appearing on a report for being at risk of failing the adherence measure for cholesterol (statin) and hypertension (ACEi/ARB) medications this calendar year.   Medication: losartan /hydrochlorothiazide   Last fill date: 09/14/23 for 90 day supply  Medication: rosuvastatin  Last fill date: 09/14/23 for 90 day supply  Insurance report was not up to date. No action needed at this time.   Chez Bulnes E. Marsh, PharmD Clinical Pharmacist [facility] [phone]

## 2023-10-04 NOTE — Telephone Encounter (Signed)
 FYI Only or Action Required?: FYI only for provider.  Patient was last seen in primary care on 07/06/2023 by Gareth Mliss FALCON, FNP.  Called Nurse Triage reporting Headache.  Symptoms began several weeks ago.  Interventions attempted: Prescription medications: Pain medication.  Symptoms are: unchanged.  Triage Disposition: See Physician Within 24 Hours  Patient/caregiver understands and will follow disposition?: Yes Reason for Disposition  [1] MODERATE headache (e.g., interferes with normal activities) AND [2] present > 24 hours AND [3] unexplained  (Exceptions: Pain medicines not tried, typical migraine, or headache part of viral illness.)  Answer Assessment - Initial Assessment Questions Finished antibiotic for sinus inflection today, but still experiencing the headaches.   1. LOCATION: Where does it hurt?      In between eyebrows and eyes  2. ONSET: When did the headache start? (e.g., minutes, hours, days)      3 weeks  3. PATTERN: Does the pain come and go, or has it been constant since it started?     Constant   4. SEVERITY: How bad is the pain? and What does it keep you from doing?  (e.g., Scale 1-10; mild, moderate, or severe)     8/10  5. CAUSE: What do you think is causing the headache?     Unsure  6. MIGRAINE: Have you been diagnosed with migraine headaches? If Yes, ask: Is this headache similar?      No  7. HEAD INJURY: Has there been any recent injury to your head?      No  8. OTHER SYMPTOMS: Do you have any other symptoms? (e.g., fever, stiff neck, eye pain, sore throat, cold symptoms)     No  Protocols used: Headache-A-AH  Copied from CRM #[id number]. Topic: Clinical - Red Word Triage >> Oct 04, 2023  3:13 PM Selinda RAMAN wrote: Red Word that prompted transfer to Nurse Triage: The patient called in stating he has been dealing with a horrible headache the last couple of weeks. It bothered him so much he went to [facility] ER on 10/01/23 and was  prescribed pain meds HYDROcodone -acetaminophen  (NORCO/VICODIN) 5-325 MG tablet but he states they barely help and rare off quick. I will transfer him to [facility]

## 2023-10-05 ENCOUNTER — Ambulatory Visit (INDEPENDENT_AMBULATORY_CARE_PROVIDER_SITE_OTHER): Admitting: Nurse Practitioner

## 2023-10-05 ENCOUNTER — Encounter: Payer: Self-pay | Admitting: Nurse Practitioner

## 2023-10-05 VITALS — BP 124/78 | HR 68 | Temp 98.1°F | Resp 18 | Ht 65.0 in | Wt 179.0 lb

## 2023-10-05 DIAGNOSIS — R519 Headache, unspecified: Secondary | ICD-10-CM

## 2023-10-05 MED ORDER — NAPROXEN 500 MG PO TABS: 500.0000 mg | ORAL_TABLET | Freq: Two times a day (BID) | ORAL | 0 refills | Status: DC

## 2023-10-05 NOTE — Progress Notes (Signed)
 BP 124/78   Pulse 68   Temp 98.1 F (36.7 C)   Resp 18   Ht 5' 5 (1.651 m)   Wt 179 lb (81.2 kg)   SpO2 98%   BMI 29.79 kg/m    Subjective:    Patient ID: Aaron Calhoun., male    DOB: 1945-03-26, 78 y.o.   MRN: [id number]  HPI: Denario Bagot. is a 78 y.o. male presenting today for an ER follow-up after being seen in the ER on 10/01/23 for a sinus headache (see ER note below). Patient reports that he finished the steroid given to him in the ER and he also has completed the Augmentin . Patient still reports a consistent headache in the middle of his head and reports it is worse at night. He denies any headache triggers. He reports, occasional blurry vision and dizziness if I move my head too fast.. He denies any cough, runny nose, nausea, vomiting, or fever.He reports recent BP reading at home was 124/70. Patient is due to see Dr. Herminio on the 10th of October for an ENT follow-up.   ER Note from 10/01/23:  78 year old male presents to the ED with complaint of headache for the last 2 weeks.  He is currently taking Augmentin  for what he believed to be a sinus infection.  He is also on his own using Flonase nasal spray daily.  He also has seen Dr. Mcqueen for something unrelated and is going to the [facility] for hearing check and hearing aids.  At this time it appears that patient has a sinus infection causing his chronic headache.  CT scan of his head was reassuring and patient was made aware.  A short course of steroids was prescribed and patient was encouraged to continue using Flonase nasal spray.  He is also to continue taking the Augmentin  prescribed for him for his sinus infection.  If he continues to have problems with his sinus he is to follow-up with Dr. Mcqueen for the [facility].  A prescription for hydrocodone  was sent to the pharmacy for him to take every 8 hours if needed for moderate to severe headache.        EXAM: CT HEAD WITHOUT CONTRAST    TECHNIQUE: Contiguous axial images were obtained from the base of the skull through the vertex without intravenous contrast.   RADIATION DOSE REDUCTION: This exam was performed according to the departmental dose-optimization program which includes automated exposure control, adjustment of the mA and/or kV according to patient size and/or use of iterative reconstruction technique.   COMPARISON:  Brain MRI 09/14/2019.   FINDINGS: Brain: Normal cerebral volume for age. No midline shift, ventriculomegaly, mass effect, evidence of mass lesion, intracranial hemorrhage or evidence of cortically based acute infarction. Gray-white differentiation within normal limits for age.   Vascular: Calcified atherosclerosis at the skull base. No suspicious intracranial vascular hyperdensity.   Skull: Intact.  No acute osseous abnormality identified.   Sinuses/Orbits: Chronic paranasal sinus mucosal thickening, not significantly changed from 2021 MRI. No convincing layering sinus fluid. Tympanic cavities and mastoids appear clear.   Other: Asymmetric left posterior vertex scalp soft tissue (series 3, image 68) might be chronic scarring in the absence of acute trauma. No scalp soft tissue gas. Visualized orbit soft tissues are within normal limits.   IMPRESSION: 1. Normal for age noncontrast CT appearance of the brain. 2. Chronic paranasal sinus disease, not significantly changed from a 2021 MRI.     07/06/2023    7:35  AM 04/08/2023    8:39 AM 01/04/2023    8:47 AM  Depression screen PHQ 2/9  Decreased Interest 0 1 2  Down, Depressed, Hopeless 0 1 2  PHQ - 2 Score 0 2 4  Altered sleeping  0 1  Tired, decreased energy  1 0  Change in appetite  0 0  Feeling bad or failure about yourself   0 1  Trouble concentrating  0 0  Moving slowly or fidgety/restless  0 0  Suicidal thoughts  0 0  PHQ-9 Score  3 6  Difficult doing work/chores  Not difficult at all Very difficult    Relevant past  medical, surgical, family and social history reviewed and updated as indicated. Interim medical history since our last visit reviewed. Allergies and medications reviewed and updated.  Review of Systems  Ten systems reviewed and is negative except as mentioned in HPI      Objective:     BP 124/78   Pulse 68   Temp 98.1 F (36.7 C)   Resp 18   Ht 5' 5 (1.651 m)   Wt 179 lb (81.2 kg)   SpO2 98%   BMI 29.79 kg/m    Wt Readings from Last 3 Encounters:  10/05/23 179 lb (81.2 kg)  10/01/23 158 lb (71.7 kg)  07/06/23 156 lb (70.8 kg)    Physical Exam Constitutional:      Appearance: He is well-developed and normal weight.  HENT:     Head: Normocephalic and atraumatic.  Eyes:     Extraocular Movements: Extraocular movements intact.  Cardiovascular:     Rate and Rhythm: Normal rate and regular rhythm.     Heart sounds: Normal heart sounds.  Pulmonary:     Effort: Pulmonary effort is normal.     Breath sounds: Normal breath sounds.  Musculoskeletal:        General: Normal range of motion.     Cervical back: Normal range of motion and neck supple.  Skin:    General: Skin is warm and dry.  Neurological:     Mental Status: He is alert and oriented to person, place, and time.     Cranial Nerves: No cranial nerve deficit.     Sensory: No sensory deficit.     Motor: No weakness.  Psychiatric:        Mood and Affect: Mood normal.        Speech: Speech normal.        Behavior: Behavior normal.      Results for orders placed or performed during the hospital encounter of 10/01/23  CBG monitoring, ED   Collection Time: 10/01/23  8:15 AM  Result Value Ref Range   Glucose-Capillary 164 (H) 70 - 99 mg/dL          Assessment & Plan:   Problem List Items Addressed This Visit   None Visit Diagnoses       Acute intractable headache, unspecified headache type    -  Primary   Neurology referral placed. Naproxen  prescription sent to pharmacy for headache relief. Told to  stop the Hydrocodone .   Relevant Medications   naproxen  (NAPROSYN ) 500 MG tablet   Other Relevant Orders   Ambulatory referral to Neurology      -Referral placed for [facility]. Be awaiting phone call to set up appointment.  -Discontinue taking the Hydrocodone  prescribed in the ER -Begin taking Naproxen  500 mg, 2 times daily with meals for headache relief.  -Discussed the Importance  of hydration and staying well-nourished.  -Advised taking Claritin daily for allergy symptom relief          Follow up plan: Return if symptoms worsen or fail to improve.   I have reviewed this encounter including the documentation in this note and/or discussed this patient with the provider, Aislinn Womack, SNP, I am certifying that I agree with the content of this note as supervising/preceptor nurse practitioner.  Mliss Spray, FNP-C [facility] 10/05/2023, 10:52 AM

## 2023-10-19 DIAGNOSIS — I10 Essential (primary) hypertension: Secondary | ICD-10-CM | POA: Diagnosis not present

## 2023-10-19 DIAGNOSIS — H25813 Combined forms of age-related cataract, bilateral: Secondary | ICD-10-CM | POA: Diagnosis not present

## 2023-10-19 DIAGNOSIS — H35033 Hypertensive retinopathy, bilateral: Secondary | ICD-10-CM | POA: Diagnosis not present

## 2023-10-19 DIAGNOSIS — H35061 Retinal vasculitis, right eye: Secondary | ICD-10-CM | POA: Diagnosis not present

## 2023-10-19 DIAGNOSIS — E119 Type 2 diabetes mellitus without complications: Secondary | ICD-10-CM | POA: Diagnosis not present

## 2023-10-19 DIAGNOSIS — H1789 Other corneal scars and opacities: Secondary | ICD-10-CM | POA: Diagnosis not present

## 2023-10-19 DIAGNOSIS — R519 Headache, unspecified: Secondary | ICD-10-CM | POA: Diagnosis not present

## 2023-10-19 LAB — OPHTHALMOLOGY REPORT-SCANNED

## 2023-10-20 DIAGNOSIS — R42 Dizziness and giddiness: Secondary | ICD-10-CM | POA: Diagnosis not present

## 2023-10-20 DIAGNOSIS — G44209 Tension-type headache, unspecified, not intractable: Secondary | ICD-10-CM | POA: Diagnosis not present

## 2023-10-20 DIAGNOSIS — Z1331 Encounter for screening for depression: Secondary | ICD-10-CM | POA: Diagnosis not present

## 2023-11-10 DIAGNOSIS — R7309 Other abnormal glucose: Secondary | ICD-10-CM | POA: Diagnosis not present

## 2023-11-25 ENCOUNTER — Telehealth: Payer: Self-pay | Admitting: Nurse Practitioner

## 2023-11-25 NOTE — Telephone Encounter (Signed)
 empagliflozin  (JARDIANCE ) 25 MG TABS tablet

## 2024-01-07 ENCOUNTER — Ambulatory Visit
Admission: RE | Admit: 2024-01-07 | Discharge: 2024-01-07 | Disposition: A | Discharge status: Home / Self Care | Attending: Nurse Practitioner | Admitting: Nurse Practitioner

## 2024-01-07 ENCOUNTER — Encounter: Payer: Self-pay | Admitting: Nurse Practitioner

## 2024-01-07 ENCOUNTER — Ambulatory Visit: Admitting: Nurse Practitioner

## 2024-01-07 ENCOUNTER — Ambulatory Visit: Payer: Self-pay | Admitting: Nurse Practitioner

## 2024-01-07 ENCOUNTER — Ambulatory Visit
Admission: RE | Admit: 2024-01-07 | Discharge: 2024-01-07 | Disposition: A | Discharge status: Home / Self Care | Source: Ambulatory Visit | Attending: Nurse Practitioner | Admitting: Nurse Practitioner

## 2024-01-07 VITALS — BP 136/78 | HR 75 | Temp 97.7°F | Ht 65.0 in | Wt 160.0 lb

## 2024-01-07 DIAGNOSIS — Z7984 Long term (current) use of oral hypoglycemic drugs: Secondary | ICD-10-CM | POA: Diagnosis not present

## 2024-01-07 DIAGNOSIS — M1990 Unspecified osteoarthritis, unspecified site: Secondary | ICD-10-CM

## 2024-01-07 DIAGNOSIS — E785 Hyperlipidemia, unspecified: Secondary | ICD-10-CM

## 2024-01-07 DIAGNOSIS — N401 Enlarged prostate with lower urinary tract symptoms: Secondary | ICD-10-CM | POA: Diagnosis not present

## 2024-01-07 DIAGNOSIS — R351 Nocturia: Secondary | ICD-10-CM

## 2024-01-07 DIAGNOSIS — E1169 Type 2 diabetes mellitus with other specified complication: Secondary | ICD-10-CM | POA: Diagnosis not present

## 2024-01-07 DIAGNOSIS — G44229 Chronic tension-type headache, not intractable: Secondary | ICD-10-CM | POA: Diagnosis not present

## 2024-01-07 DIAGNOSIS — I10 Essential (primary) hypertension: Secondary | ICD-10-CM

## 2024-01-07 DIAGNOSIS — E1140 Type 2 diabetes mellitus with diabetic neuropathy, unspecified: Secondary | ICD-10-CM

## 2024-01-07 DIAGNOSIS — R1084 Generalized abdominal pain: Secondary | ICD-10-CM

## 2024-01-07 MED ORDER — PROPRANOLOL HCL 10 MG PO TABS: 10.0000 mg | ORAL_TABLET | Freq: Two times a day (BID) | ORAL | 1 refills | Status: AC

## 2024-01-07 MED ORDER — EMPAGLIFLOZIN 25 MG PO TABS: 25.0000 mg | ORAL_TABLET | Freq: Every day | ORAL | 1 refills | Status: AC

## 2024-01-07 NOTE — Progress Notes (Signed)
 "  BP 136/78   Pulse 75   Temp 97.7 F (36.5 C)   Ht 5' 5 (1.651 m)   Wt 160 lb (72.6 kg)   SpO2 97%   BMI 26.63 kg/m    Subjective:    Patient ID: Aaron Calhoun., male    DOB: 12/31/1945, 78 y.o.   MRN: [id number]  HPI: Aaron Calhoun. is a 78 y.o. male  Chief Complaint  Patient presents with   Medical Management of Chronic Issues   Discussed the use of AI scribe software for clinical note transcription with the patient, who gave verbal consent to proceed.  History of Present Illness Aaron Calhoun. is a 78 year old male who presents for a six month follow-up.  Cephalgia (headache) - Headaches occur three to four days per week - Severity is less than previously experienced - Nortriptyline previously trialed, resulted in worsening of headaches -was evaluated by neurology - Propranolol  has not been trialed  Morning abdominal pain - Stomach ache present every morning upon waking for approximately one year - Pain described as an ache that subsides with movement - No associated nausea or diarrhea - Occasional constipation requiring use of a laxative  Glycemic control - Morning blood glucose levels elevated, ranging from 150-160 mg/dL - Evening blood glucose levels decrease to approximately 89 mg/dL - No symptoms associated with blood glucose fluctuations   HTN - BP Readings from Last 3 Encounters:  01/07/24 136/78  10/05/23 124/78  10/01/23 (!) 166/95   Currently not on blood pressure medication  HLD -currently taking rosuvastatin  5 mg daily Lipid Panel     Component Value Date/Time   CHOL 129 07/06/2023 0759   CHOL 140 07/19/2015 1016   TRIG 85 07/06/2023 0759   HDL 76 07/06/2023 0759   HDL 70 07/19/2015 1016   CHOLHDL 1.7 07/06/2023 0759   VLDL 13 05/05/2016 1435   LDLCALC 36 07/06/2023 0759   LABVLDL 22 07/19/2015 1016         07/06/2023    7:35 AM 04/08/2023    8:39 AM 01/04/2023    8:47 AM  Depression screen PHQ 2/9  Decreased Interest  0 1 2  Down, Depressed, Hopeless 0 1 2  PHQ - 2 Score 0 2 4  Altered sleeping  0 1  Tired, decreased energy  1 0  Change in appetite  0 0  Feeling bad or failure about yourself   0 1  Trouble concentrating  0 0  Moving slowly or fidgety/restless  0 0  Suicidal thoughts  0 0  PHQ-9 Score  3  6   Difficult doing work/chores  Not difficult at all Very difficult     Data saved with a previous flowsheet row definition    Relevant past medical, surgical, family and social history reviewed and updated as indicated. Interim medical history since our last visit reviewed. Allergies and medications reviewed and updated.  Review of Systems Ten systems reviewed and is negative except as mentioned in HPI      Objective:      BP 136/78   Pulse 75   Temp 97.7 F (36.5 C)   Ht 5' 5 (1.651 m)   Wt 160 lb (72.6 kg)   SpO2 97%   BMI 26.63 kg/m    Wt Readings from Last 3 Encounters:  01/07/24 160 lb (72.6 kg)  10/05/23 179 lb (81.2 kg)  10/01/23 158 lb (71.7 kg)    Physical Exam GENERAL: Alert, cooperative, well  developed, no acute distress HEENT: Normocephalic, normal oropharynx, moist mucous membranes CHEST: Clear to auscultation bilaterally, no wheezes, rhonchi, or crackles CARDIOVASCULAR: Normal heart rate and rhythm, S1 and S2 normal without murmurs ABDOMEN: Soft, non-tender, non-distended, without organomegaly, normal bowel sounds EXTREMITIES: No cyanosis or edema NEUROLOGICAL: Cranial nerves grossly intact, moves all extremities without gross motor or sensory deficit  Results for orders placed or performed in visit on 10/20/23  OPHTHALMOLOGY REPORT-SCANNED   Collection Time: 10/19/23  3:39 PM  Result Value Ref Range   HM Diabetic Eye Exam No Retinopathy No Retinopathy          Assessment & Plan:   Problem List Items Addressed This Visit       Cardiovascular and Mediastinum   Hypertension goal BP (blood pressure) < 140/90 - Primary (Chronic)   Relevant Medications    propranolol  (INDERAL ) 10 MG tablet   Other Relevant Orders   CBC with Differential/Platelet   Comprehensive metabolic panel with GFR     Endocrine   Type 2 diabetes mellitus with diabetic neuropathy, without long-term current use of insulin (HCC)   Relevant Medications   empagliflozin  (JARDIANCE ) 25 MG TABS tablet   Other Relevant Orders   Comprehensive metabolic panel with GFR   Hemoglobin A1c   Hyperlipidemia associated with type 2 diabetes mellitus (HCC)   Relevant Medications   empagliflozin  (JARDIANCE ) 25 MG TABS tablet   propranolol  (INDERAL ) 10 MG tablet   Other Relevant Orders   Comprehensive metabolic panel with GFR   Lipid panel     Musculoskeletal and Integument   Arthritis     Genitourinary   Benign prostate hyperplasia     Other   Chronic tension-type headache, not intractable   Relevant Medications   propranolol  (INDERAL ) 10 MG tablet   Other Visit Diagnoses       Generalized abdominal pain       Relevant Orders   DG Abd 1 View        Assessment and Plan Assessment & Plan Chronic tension-type headache Chronic tension-type headache occurring 3-4 days a week, previously unresponsive to nortriptyline. Symptoms have slightly improved but persist. - Initiated propranolol  twice daily for headache prevention - Instructed to report if propranolol  is ineffective  Generalized abdominal pain Generalized abdominal pain occurring every morning, described as an aching sensation that resolves after movement. No associated nausea or diarrhea. Symptoms suggestive of a possible gastrointestinal or bladder-related issue. - Ordered abdominal x-ray to evaluate for underlying causes  Type 2 diabetes mellitus with diabetic neuropathy and hyperlipidemia Type 2 diabetes mellitus with diabetic neuropathy and hyperlipidemia. Current medications include Jardiance , glimepiride , metformin , and rosuvastatin . Blood sugar levels are generally high, with occasional readings around  89 mg/dL in the evening without symptoms of hypoglycemia. - Refilled Jardiance  prescription - Continue current diabetes medications  Essential hypertension Essential hypertension, currently managed with losartan  hydrochlorothiazide . Blood pressure was slightly elevated during the visit. - Rechecked blood pressure before leaving the clinic  Osteoarthritis Osteoarthritis, previously managed with naproxen . Naproxen  is no longer being taken. - Discontinued naproxen  from medication list  Benign prostatic hyperplasia with lower urinary tract symptoms Benign prostatic hyperplasia with lower urinary tract symptoms, managed with Proscar . No new symptoms reported. - Continue Proscar  as prescribed  General Health Maintenance Due for an A1c test. Last A1c was 7.8% in October 2025. - Ordered A1c test        Follow up plan: Return in about 6 months (around 07/07/2024) for follow up. "

## 2024-01-08 LAB — CBC WITH DIFFERENTIAL/PLATELET
Absolute Lymphocytes: 2430 {cells}/uL (ref 850–3900)
Absolute Monocytes: 522 {cells}/uL (ref 200–950)
Basophils Absolute: 17 {cells}/uL (ref 0–200)
Basophils Relative: 0.3 %
Eosinophils Absolute: 244 {cells}/uL (ref 15–500)
Eosinophils Relative: 4.2 %
HCT: 45.7 % (ref 39.4–51.1)
Hemoglobin: 15.3 g/dL (ref 13.2–17.1)
MCH: 32.1 pg (ref 27.0–33.0)
MCHC: 33.5 g/dL (ref 31.6–35.4)
MCV: 96 fL (ref 81.4–101.7)
MPV: 10.7 fL (ref 7.5–12.5)
Monocytes Relative: 9 %
Neutro Abs: 2587 {cells}/uL (ref 1500–7800)
Neutrophils Relative %: 44.6 %
Platelets: 227 Thousand/uL (ref 140–400)
RBC: 4.76 Million/uL (ref 4.20–5.80)
RDW: 11.9 % (ref 11.0–15.0)
Total Lymphocyte: 41.9 %
WBC: 5.8 Thousand/uL (ref 3.8–10.8)

## 2024-01-08 LAB — COMPREHENSIVE METABOLIC PANEL WITH GFR
AG Ratio: 1.8 (calc) (ref 1.0–2.5)
ALT: 21 U/L (ref 9–46)
AST: 14 U/L (ref 10–35)
Albumin: 4.6 g/dL (ref 3.6–5.1)
Alkaline phosphatase (APISO): 73 U/L (ref 35–144)
BUN/Creatinine Ratio: 23 (calc) — ABNORMAL HIGH (ref 6–22)
BUN: 16 mg/dL (ref 7–25)
CO2: 27 mmol/L (ref 20–32)
Calcium: 9.1 mg/dL (ref 8.6–10.3)
Chloride: 103 mmol/L (ref 98–110)
Creat: 0.69 mg/dL — ABNORMAL LOW (ref 0.70–1.28)
Globulin: 2.6 g/dL (ref 1.9–3.7)
Glucose, Bld: 186 mg/dL — ABNORMAL HIGH (ref 65–99)
Potassium: 4.1 mmol/L (ref 3.5–5.3)
Sodium: 139 mmol/L (ref 135–146)
Total Bilirubin: 0.5 mg/dL (ref 0.2–1.2)
Total Protein: 7.2 g/dL (ref 6.1–8.1)
eGFR: 94 mL/min/1.73m2

## 2024-01-08 LAB — HEMOGLOBIN A1C
Hgb A1c MFr Bld: 7.7 % — ABNORMAL HIGH
Mean Plasma Glucose: 174 mg/dL
eAG (mmol/L): 9.7 mmol/L

## 2024-01-08 LAB — LIPID PANEL
Cholesterol: 115 mg/dL
HDL: 58 mg/dL
LDL Cholesterol (Calc): 38 mg/dL
Non-HDL Cholesterol (Calc): 57 mg/dL
Total CHOL/HDL Ratio: 2 (calc)
Triglycerides: 100 mg/dL

## 2024-02-15 ENCOUNTER — Telehealth: Payer: Self-pay

## 2024-02-15 NOTE — Telephone Encounter (Signed)
Refill request on  metFORMIN (GLUCOPHAGE) 1000 MG tablet. 

## 2024-02-15 NOTE — Telephone Encounter (Signed)
 Refills enough for 1 year not needed at this time

## 2024-04-13 ENCOUNTER — Encounter

## 2024-07-07 ENCOUNTER — Ambulatory Visit: Admitting: Nurse Practitioner
